# Patient Record
Sex: Female | Born: 1975 | Race: Black or African American | Hispanic: No | Smoking: Former smoker
Health system: Southern US, Community
[De-identification: ages and names within clinical notes are randomized; demographics above are authoritative.]

## PROBLEM LIST (undated history)

## (undated) ENCOUNTER — Emergency Department (HOSPITAL_COMMUNITY): Admission: EM | Payer: Medicaid Other | Source: Home / Self Care

## (undated) DIAGNOSIS — G7000 Myasthenia gravis without (acute) exacerbation: Secondary | ICD-10-CM

## (undated) DIAGNOSIS — G43909 Migraine, unspecified, not intractable, without status migrainosus: Secondary | ICD-10-CM

## (undated) DIAGNOSIS — Z9289 Personal history of other medical treatment: Secondary | ICD-10-CM

## (undated) DIAGNOSIS — F32A Depression, unspecified: Secondary | ICD-10-CM

## (undated) DIAGNOSIS — J45909 Unspecified asthma, uncomplicated: Secondary | ICD-10-CM

## (undated) DIAGNOSIS — I10 Essential (primary) hypertension: Secondary | ICD-10-CM

## (undated) DIAGNOSIS — F329 Major depressive disorder, single episode, unspecified: Secondary | ICD-10-CM

## (undated) DIAGNOSIS — Z8709 Personal history of other diseases of the respiratory system: Secondary | ICD-10-CM

## (undated) DIAGNOSIS — D649 Anemia, unspecified: Secondary | ICD-10-CM

## (undated) DIAGNOSIS — E669 Obesity, unspecified: Secondary | ICD-10-CM

## (undated) HISTORY — PX: EAR TUBE REMOVAL: SHX1486

## (undated) HISTORY — DX: Depression, unspecified: F32.A

## (undated) HISTORY — DX: Essential (primary) hypertension: I10

## (undated) HISTORY — PX: TYMPANOSTOMY TUBE PLACEMENT: SHX32

## (undated) HISTORY — DX: Obesity, unspecified: E66.9

## (undated) HISTORY — DX: Major depressive disorder, single episode, unspecified: F32.9

## (undated) HISTORY — DX: Personal history of other diseases of the respiratory system: Z87.09

## (undated) HISTORY — DX: Migraine, unspecified, not intractable, without status migrainosus: G43.909

---

## 2003-08-03 DIAGNOSIS — G7000 Myasthenia gravis without (acute) exacerbation: Secondary | ICD-10-CM

## 2003-08-03 HISTORY — DX: Myasthenia gravis without (acute) exacerbation: G70.00

## 2006-11-28 ENCOUNTER — Emergency Department (HOSPITAL_COMMUNITY): Admission: EM | Admit: 2006-11-28 | Discharge: 2006-11-29 | Payer: Self-pay | Admitting: Emergency Medicine

## 2007-01-25 ENCOUNTER — Ambulatory Visit: Payer: Self-pay | Admitting: Family Medicine

## 2007-03-16 ENCOUNTER — Emergency Department (HOSPITAL_COMMUNITY): Admission: EM | Admit: 2007-03-16 | Discharge: 2007-03-16 | Payer: Self-pay | Admitting: Emergency Medicine

## 2007-04-21 ENCOUNTER — Emergency Department (HOSPITAL_COMMUNITY): Admission: EM | Admit: 2007-04-21 | Discharge: 2007-04-21 | Payer: Self-pay | Admitting: Family Medicine

## 2007-06-19 ENCOUNTER — Other Ambulatory Visit: Admission: RE | Admit: 2007-06-19 | Discharge: 2007-06-19 | Payer: Self-pay | Admitting: Family Medicine

## 2008-01-02 ENCOUNTER — Encounter: Admission: RE | Admit: 2008-01-02 | Discharge: 2008-01-02 | Payer: Self-pay | Admitting: Neurology

## 2008-01-09 ENCOUNTER — Inpatient Hospital Stay (HOSPITAL_COMMUNITY): Admission: EM | Admit: 2008-01-09 | Discharge: 2008-01-15 | Payer: Self-pay | Admitting: Emergency Medicine

## 2008-01-16 ENCOUNTER — Encounter (HOSPITAL_COMMUNITY): Admission: RE | Admit: 2008-01-16 | Discharge: 2008-04-03 | Payer: Self-pay | Admitting: Neurology

## 2008-03-24 ENCOUNTER — Emergency Department (HOSPITAL_COMMUNITY): Admission: EM | Admit: 2008-03-24 | Discharge: 2008-03-25 | Payer: Self-pay | Admitting: Emergency Medicine

## 2008-05-27 ENCOUNTER — Inpatient Hospital Stay (HOSPITAL_COMMUNITY): Admission: EM | Admit: 2008-05-27 | Discharge: 2008-05-28 | Payer: Self-pay | Admitting: Emergency Medicine

## 2008-08-31 ENCOUNTER — Emergency Department (HOSPITAL_COMMUNITY): Admission: EM | Admit: 2008-08-31 | Discharge: 2008-08-31 | Payer: Self-pay | Admitting: Emergency Medicine

## 2008-12-11 ENCOUNTER — Emergency Department (HOSPITAL_COMMUNITY): Admission: EM | Admit: 2008-12-11 | Discharge: 2008-12-11 | Payer: Self-pay | Admitting: Emergency Medicine

## 2009-02-20 ENCOUNTER — Emergency Department (HOSPITAL_COMMUNITY): Admission: EM | Admit: 2009-02-20 | Discharge: 2009-02-20 | Payer: Self-pay | Admitting: Emergency Medicine

## 2009-10-16 ENCOUNTER — Emergency Department (HOSPITAL_COMMUNITY): Admission: EM | Admit: 2009-10-16 | Discharge: 2009-10-16 | Payer: Self-pay | Admitting: Emergency Medicine

## 2010-10-25 ENCOUNTER — Emergency Department (HOSPITAL_COMMUNITY): Payer: Medicaid Other

## 2010-10-25 ENCOUNTER — Emergency Department (HOSPITAL_COMMUNITY)
Admission: EM | Admit: 2010-10-25 | Discharge: 2010-10-25 | Disposition: A | Discharge status: Home / Self Care | Payer: Medicaid Other | Attending: Emergency Medicine | Admitting: Emergency Medicine

## 2010-10-25 DIAGNOSIS — R079 Chest pain, unspecified: Secondary | ICD-10-CM | POA: Insufficient documentation

## 2010-10-25 DIAGNOSIS — M549 Dorsalgia, unspecified: Secondary | ICD-10-CM | POA: Insufficient documentation

## 2010-10-25 DIAGNOSIS — J45909 Unspecified asthma, uncomplicated: Secondary | ICD-10-CM | POA: Insufficient documentation

## 2010-10-25 DIAGNOSIS — G7000 Myasthenia gravis without (acute) exacerbation: Secondary | ICD-10-CM | POA: Insufficient documentation

## 2010-10-25 DIAGNOSIS — M79609 Pain in unspecified limb: Secondary | ICD-10-CM | POA: Insufficient documentation

## 2010-10-25 DIAGNOSIS — I10 Essential (primary) hypertension: Secondary | ICD-10-CM | POA: Insufficient documentation

## 2010-10-25 LAB — BASIC METABOLIC PANEL
GFR calc Af Amer: >60 mL/min (ref >60)
GFR calc non Af Amer: >60 mL/min (ref >60)
Potassium: 3.4 mEq/L — ABNORMAL LOW (ref 3.5–5.1)
Sodium: 136 mEq/L (ref 135–145)

## 2010-10-25 LAB — POCT CARDIAC MARKERS
CKMB, poc: 1.5 ng/mL (ref 1.0–8.0)
Troponin i, poc: <0.05 ng/mL (ref 0.00–0.09)

## 2010-10-25 LAB — CBC
Hemoglobin: 12.9 g/dL (ref 12.0–15.0)
MCH: 29 pg (ref 26.0–34.0)
MCHC: 34.1 g/dL (ref 30.0–36.0)
RDW: 13 % (ref 11.5–15.5)

## 2010-10-25 LAB — DIFFERENTIAL
Eosinophils Relative: 3 % (ref 0–5)
Lymphs Abs: 2.2 10*3/uL (ref 0.7–4.0)
Monocytes Relative: 9 % (ref 3–12)
Neutro Abs: 3.4 10*3/uL (ref 1.7–7.7)
Neutrophils Relative %: 54 % (ref 43–77)

## 2010-10-25 LAB — D-DIMER, QUANTITATIVE: D-Dimer, Quant: <0.22 ug/mL-FEU (ref 0.00–0.48)

## 2010-11-08 LAB — URINALYSIS, ROUTINE W REFLEX MICROSCOPIC
Bilirubin Urine: NEGATIVE
Glucose, UA: NEGATIVE mg/dL
Hgb urine dipstick: NEGATIVE
Ketones, ur: NEGATIVE mg/dL
Specific Gravity, Urine: 1.035 — ABNORMAL HIGH (ref 1.005–1.030)
Urobilinogen, UA: 0.2 mg/dL (ref 0.0–1.0)
pH: 5.5 (ref 5.0–8.0)

## 2010-11-08 LAB — POCT PREGNANCY, URINE: Preg Test, Ur: NEGATIVE

## 2010-12-15 NOTE — Consult Note (Signed)
Terri Chambers, Terri Chambers NO.:  [id number]   MEDICAL RECORD NO.:  [id number]          PATIENT TYPE:  INP   LOCATION:  3315                         FACILITY:  [facility]   PHYSICIAN:  Levert Feinstein, MD          DATE OF BIRTH:  Dec 05, 1975   DATE OF CONSULTATION:  DATE OF DISCHARGE:                                 CONSULTATION   CHIEF COMPLAINT:  Worsening of myasthenia gravis symptoms.   HISTORY OF PRESENT ILLNESS:  This is a 35 year old [place] American  female, previously evaluated by Drs. Caryl Bis at [facility] on Dec 18, 2007, presenting with worsening dysarthria and  dysphagia.   Terri Chambers was diagnosed with myasthenia in 2005 at [place] 4 weeks  postpartum.  The initial symptoms was ptosis, diplopia, and also  profound bulbar weakness to the point of difficulty talking and  difficulty swallowing saliva, requires suction and also has generalized  limb muscle weakness, but she never required intubation.  She reported  her initial symptoms was controlled with high dose of steroid within 2  months' period of time, in combination with Mestinon.  Later the  prednisone was stopped.  Over the years, there is some fluctuation of  her symptoms mainly presenting with worsening ptosis, diplopia and  dysarthria, dysphagia, but overall she was able to maintain functioning  with varying dose of Mestinon, she moved to [place] about a year later  in 2006, was reported to be evaluated for potential thymectomy.  But due  to worry about the potential complication from the surgery, it was not  further proceeded.   When she was evaluated by Dr. Thad Ranger on Dec 18, 2007, she was taking  Mestinon regular doses 4 times a day, in addition Mestinon Timespan 180  twice every night. She reported if she miss her Mestinon Timespan at  nighttime, she would develop shortness of breath with prolonged sleeping  and has difficulty getting around in the morning time.   She was started on  CellCept and took her first dose last night.  Shas  run out of her Mestinon Timespan because the pharmacy she used, no  longer carry the medications.  She also missed the regular mestinon with  the misunderstanding that it should be replaced by cellcept.  This  morning when she got up she has a worsening dysarthria, dysphagia, and  but she was able to go to her college and she presented to the Urgent  Care this afternoon and was found to have low-grade fever up to 100, and  also with worsening generalized weakness and bulbar weakness, which has  prompted her ER visit.   REVIEW OF SYSTEMS:  She denies chest pain, coughing, or dysuria.   PAST MEDICAL HISTORY:  Recently diagnosed with hypermedication.   FAMILY HISTORY:  Mother has diabetes and hypertension in both parents.   SOCIAL HISTORY:  She is currently a Archivist and denies drink or  illicit drug use, quit smoking a year ago and lives with her sister and  91-year-old child.   MEDICATIONS:  Mestinon regular 60 mg up  to 5 times a day and Timespan  has run out.   DRUG ALLERGY:  No known drug allergy.   PHYSICAL EXAMINATION:  VITAL SIGNS:  Temperature 99.5, blood pressure  120/70, heart rate of 85, and respirations of 16.  GENERAL:  She is a tired looking, obese female, and with slurred nasal  speech.  CARDIAC:  Regular rate and rhythm.  PULMONARY:  With poor air movement at the base.  NECK:  Supple.  No carotid bruits.  NEUROLOGICAL:  She is alert and oriented to history taking, can carry a  conversation but developed shortness of breath with prolonged talking.  Cranial nerves II through XII.  Pupils were equal, reactive, and she has  moderate bilateral ptosis, covering one-third of the upper edge of the  bilateral pupil.  There was profound limitation of the right lateral  gaze, cover and uncover demonstrates bilateral exotropia.  She complains  of diplopia on primary gaze.  Facial sensation was normal.  She has   moderately severe eye closure, cheek process, tongue protrusion into  cheek weakness.  There was also moderate jaw opening weakness.  MOTOR EXAMINATION:  Motor strength is normal tone, bulk.  Strength is as  following: Neck flexion 4-, neck extension 5, shoulder abduction 4- and  elbow flexion 4-, extension 4-, grip 4+, hip flexion 4+ and knee flexion  4+, extension 5 and ankle dorsiflexion 5, plantar flexion 5.  Sensory  was intact.  Deep tendon reflex upper extremity 1/4.  Deep tendon lower  extremity 2/4.  Plantar responses were flexor.  Coordination normal.  Finger-to-nose, hand to shin, gait was deferred.   ASSESSMENT AND PLAN:  Myasthenia gravis likely serum- positive  generalized myasthenia gravis  presenting with exacerbation.  The plan  is as following:  1. Admit to step down unit telemetry.  2. Frequent neuro check with vital capacity qday.  3. Consult interventional radiology for Vas cath placement and she      will benefit plasmic change.  4. Hold off prednisone right now because it may cause worsening      symptoms initially.  5. Keep CellCept 500 mg b.i.d.  6. CT of the chest without contrast and to rule out the thymoma.  7. Lab evaluation to rule out UTI or pneumonia.      Levert Feinstein, MD  Electronically Signed     YY/MEDQ  D:  01/09/2008  T:  01/10/2008  Job:  [id number]

## 2010-12-15 NOTE — H&P (Signed)
NAME:  Terri Chambers, Terri Chambers NO.:  [id number]   MEDICAL RECORD NO.:  [id number]          PATIENT TYPE:  EMS   LOCATION:  MINO                         FACILITY:  [facility]   PHYSICIAN:  Ladell Pier, M.D.   DATE OF BIRTH:  1976/06/16   DATE OF ADMISSION:  05/27/2008  DATE OF DISCHARGE:                              HISTORY & PHYSICAL   CHIEF COMPLAINT:  Shortness of breath and cough.   HISTORY OF PRESENT ILLNESS:  The patient is a 35 year old African  American female, past medical history significant for myasthenia gravis.  The patient stated that for the past 2 days she has been having chills,  fever, short of breath with nausea and cough productive of yellow  sputum.  She has had no chest pain.  No sick contacts.  No abdominal  pain, no diarrhea.   PAST MEDICAL HISTORY:  Significant for myasthenia gravis.   FAMILY HISTORY:  Noncontributory.   SOCIAL HISTORY:  She does not smoke or drink alcohol.  She is a Consulting civil engineer  at [employer]  She has one daughter that is 73 years old.   MEDICATIONS:  She takes Diovan hydrochlorothiazide, unsure of the dose,  Protonix 40 mg daily, CellCept 1000 mg twice daily, and Mestinon 660 mg  4 times daily.   ALLERGIES:  No known drug allergies.   REVIEW OF SYSTEMS:  Negative otherwise stated in HPI.   PHYSICAL EXAMINATION:  VITAL SIGNS:  Temperature 100.9, blood pressure  139/83, pulse of 114, respirations 20, pulse ox 98% on room air.  GENERAL:  The patient laying on a stretcher, does not seem to be in any  acute distress.  HEENT:  Normocephalic, atraumatic.  Pupils equal, round, reactive to  light.  Throat is without erythema.  CARDIOVASCULAR:  She is tachycardiac.  LUNGS:  Decreased breath sounds throughout.  ABDOMEN:  Positive bowel sounds.  Nontender, nondistended.  EXTREMITIES: Without edema.   LABORATORY DATA:  WBC 10.2, hemoglobin 14.6, MCV 90.4, platelet 238.  UA  shows a large hemoglobin with 11-20 WBCs, sodium 133, potassium  3.3,  chloride 96, CO2 24, glucose 82, BUN 11, creatinine 0.69.   Chest x-ray shows interstitial pneumonitis versus bronchitis.   ASSESSMENT/PLAN:  1. Interstitial pneumonitis versus bronchitis.  2. Hypokalemia.  3. Hyponatremia.  4. Myasthenia gravis.  Will admit the patient to the hospital, start      her on IV Rocephin, Zithromax, Tamiflu. Put her on isolation      precautions.  Check for H1N1  blood cultures x2. Will replete her      potassium. Put her on IV fluids at 100 cc/hour normal saline bolus.      Ladell Pier, M.D.  Electronically Signed     NJ/MEDQ  D:  05/27/2008  T:  05/27/2008  Job:  [id number]

## 2010-12-15 NOTE — Discharge Summary (Signed)
[name], Terri Chambers               ACCOUNT NO.:  [id number]   MEDICAL RECORD NO.:  [id number]          PATIENT TYPE:  INP   LOCATION:  3027                         FACILITY:  [facility]   PHYSICIAN:  Casimiro Needle L. Reynolds, M.D.DATE OF BIRTH:  1976/03/21   DATE OF ADMISSION:  01/09/2008  DATE OF DISCHARGE:  01/15/2008                               DISCHARGE SUMMARY   ADMISSION DIAGNOSIS:  Myasthenia gravis with acute crisis.   DISCHARGE DIAGNOSES:  1. Myasthenia gravis with acute crisis, improving with plasmapheresis      therapy and initiation of prednisone.  2. Mild hypokalemia.   CONDITION ON DISCHARGE:  Improved.   DIET:  Regular.   ACTIVITY:  Ad lib.   MEDICATIONS AT DISCHARGE.:  1. Prednisone 20 mg 3 tablets daily (new).  2. Protonix 40 mg daily (new).  3. Mestinon 60 mg 1 tablet 4 times daily.  4. CellCept 1000 mg twice daily.   PROCEDURES:  1. Insertion of right IJ Vas-Cath on January 10, 2008.  2. Plasmapheresis January 10, 2008, January 12, 2008, and January 14, 2008.  3. CT of the chest without contrast on January 10, 2008, demonstrating      atelectasis, but no evidence of thymoma.   LABORATORY REVIEW:  CBC on admission, white count 6.9, hemoglobin 12.5,  and platelets 266,000 with normal differential.  TSH normal.  Hemoglobin  A1c 5.8.  Labs on January 14, 2008, white count 13.4, hemoglobin 11.6, and  platelets 202,000, CMET normal except for glucose of 120.  Coags normal.   HOSPITAL COURSE:  Please see the admission H and P by Dr. Levert Feinstein on  January 09, 2008, for full admission details.  Briefly, this is a 35-year-  old woman with history of myasthenia gravis who has had worsening of  symptoms over the few days prior to January 09, 2008.  Part of this was due  to the fact that she had started CellCept, and around that time, had  tried to reduce her Mestinon doses.  In addition, she was no longer able  to acquire Mestinon time span dosages, although she had been fairly  heavily  dependent.  She presented on January 09, 2008, with severe worsening  of diffuse weakness and shortness of breath.  She was admitted to the  telemetry floor because she was thought to be relatively stable  following a neuromuscular perspective.  On the morning, January 10, 2008,  she had gotten a few dose of Mestinon and was feeling better, but it  still felt to be in crisis.  She had a right IJ Vas-Cath placed as noted  above, and underwent plasmapheresis on January 10, 2008, January 12, 2008, and  January 14, 2008.  She said she felt marginally better after the  apheresis on January 10, 2008, but clearly began to feeling better after  her subsequent apheresis.  She was examined on Monday, she had only mild  facial weakness and mild weakness of intrinsic eye muscles, with no  frank diplopia, no dysphagia, and no sternal weakness of the neck or  extremities.  She was started on prednisone  on January 13, 2008, and this  did not result in any acute worsening of her symptomatology.  Because of  this, she was felt stable to go home in the morning on January 15, 2008.   DISCHARGE PLANNING:  Arrangements for outpatient plasmapheresis will be  made for January 16, 2008 and January 18, 2008.  We will then arrange for  discontinuation of the Vas-Cath on January 18, 2008.   DISCHARGE DIAGNOSIS:  She will continue on her prednisone at the present  dose for at least the next couple of weeks, as well as, her on going  treatment with Mestinon and CellCept.  She was also started on Protonix,  which she tolerated well.  She was asked to call [facility] at [phone] for followup with me in approximately 3 weeks, it  was done.  The plan will be to gradually reduce her prednisone dosage  and to maintain her on Mestinon monotherapy for her myasthenia gravis.  She was asked to call if she had any further difficulties between now  and then.      Michael L. Thad Ranger, M.D.  Electronically Signed     MLR/MEDQ  D:   01/15/2008  T:  01/15/2008  Job:  [id number]

## 2010-12-15 NOTE — Discharge Summary (Signed)
Terri Chambers, Chambers NO.:  [id number]   MEDICAL RECORD NO.:  [id number]          PATIENT TYPE:  INP   LOCATION:  5158                         FACILITY:  [facility]   PHYSICIAN:  Renee Ramus, MD       DATE OF BIRTH:  1976-05-08   DATE OF ADMISSION:  05/27/2008  DATE OF DISCHARGE:  05/28/2008                               DISCHARGE SUMMARY   PRIMARY DISCHARGE DIAGNOSIS:  Pneumonitis.   SECONDARY DIAGNOSES:  1. Myasthenia gravis.  2. Hypertension.  3. Gastroesophageal reflux disease.  4. Morbid obesity.  5. Hypokalemia/hyponatremia.   HOSPITAL COURSE:  1. Pneumonitis.  The patient is a 35 year old female who was admitted      secondary to increasing shortness of breath.  The patient was seen      in the emergency department and was admitted to our service.  The      patient did have evidence of bilateral pneumonitis on chest x-ray.      She was placed on antibiotics.  She has done well.  She will      continue on ciprofloxacin 500 mg p.o. b.i.d. x5 days postdischarge.      The patient is currently asymptomatic and wishes to be discharged.  2. Hypertension.  The patient did have mild hyponatremia and      hypokalemia, likely secondary to her Diovan/hydrochlorothiazide.      We will hold this at discharge and have her follow up with her      primary care physician to discuss whether she should be on thiazide      diuretic.  The patient's blood pressure has been stable.  3. Morbid obesity.  This is stable.  4. Myasthenia gravis.  This is too stable.  The patient will continue      on her outpatient medication regimen.   LABS OF NOTE:  1. Initial white blood cell count of 10.2, decreasing to 5.3.  2. Mild hyponatremia, sodium of 133, increasing to 135 at discharge.  3. Hypokalemia, 3.3 increasing to 3.4 on the day of discharge.  4. Mild hypoalbuminemia with an albumin of 3.1.  5. UA showing hematuria with 1120 RBCs per high-powered field, but no      evidence of  infection.   STUDIES:  Chest x-ray showing interstitial pneumonitis.   DISCHARGE MEDICATIONS:  1. Ciprofloxacin 500 mg 1 p.o. b.i.d. x5 days.  2. Protonix 40 mg p.o. daily.  3. CellCept 1 g p.o. b.i.d.  4. Diovan/hydrochlorothiazide which we are discontinuing.  5. Mestinon 60 mg p.o. q.i.d.   There were no labs or studies pending at the time of discharge.  The  patient is in stable condition and anxious for discharge.   TIME SPENT:  35 minutes.      Renee Ramus, MD  Electronically Signed     JF/MEDQ  D:  05/28/2008  T:  05/29/2008  Job:  [id number]

## 2011-04-29 LAB — PROTIME-INR
INR: 1
INR: 1.1
INR: 1.1
INR: 1.2
INR: 1.3
INR: 1.4
Prothrombin Time: 13.6
Prothrombin Time: 14.2
Prothrombin Time: 14.3
Prothrombin Time: 15.2
Prothrombin Time: 17.6 — ABNORMAL HIGH

## 2011-04-29 LAB — FIBRINOGEN: Fibrinogen: 461

## 2011-04-29 LAB — APTT
aPTT: 34
aPTT: 36

## 2011-04-29 LAB — BASIC METABOLIC PANEL WITH GFR
BUN: 5 — ABNORMAL LOW
BUN: 7
BUN: 7
BUN: 9
CO2: 23
CO2: 26
CO2: 27
CO2: 25
Calcium: 7.9 — ABNORMAL LOW
Calcium: 8.2 — ABNORMAL LOW
Calcium: 8.6
Calcium: 8 — ABNORMAL LOW
Chloride: 105
Chloride: 109
Chloride: 112
Chloride: 115 — ABNORMAL HIGH
Creatinine, Ser: 0.61
Creatinine, Ser: 0.62
Creatinine, Ser: 0.67
Creatinine, Ser: 0.68
GFR calc non Af Amer: >60
GFR calc non Af Amer: >60
GFR calc non Af Amer: >60
GFR calc non Af Amer: >60
Glucose, Bld: 100 — ABNORMAL HIGH
Glucose, Bld: 103 — ABNORMAL HIGH
Glucose, Bld: 106 — ABNORMAL HIGH
Glucose, Bld: 91
Potassium: 3.2 — ABNORMAL LOW
Potassium: 3.4 — ABNORMAL LOW
Potassium: 3.6
Potassium: 3.7
Sodium: 140
Sodium: 140
Sodium: 141
Sodium: 144

## 2011-04-29 LAB — RETICULOCYTES
RBC.: 3.97
Retic Count, Absolute: 35.7
Retic Ct Pct: 0.9

## 2011-04-29 LAB — CBC
HCT: 34 — ABNORMAL LOW
HCT: 34.2 — ABNORMAL LOW
HCT: 35.8 — ABNORMAL LOW
HCT: 36.2
HCT: 38.6
Hemoglobin: 11.6 — ABNORMAL LOW
Hemoglobin: 11.6 — ABNORMAL LOW
Hemoglobin: 12.4
Hemoglobin: 12.5
Hemoglobin: 13
MCHC: 33.6
MCHC: 33.9
MCHC: 34
MCHC: 34.6
MCV: 88.9
MCV: 89.6
MCV: 90
MCV: 90.1
Platelets: 202
Platelets: 203
Platelets: 211
Platelets: 246
RBC: 3.81 — ABNORMAL LOW
RBC: 3.83 — ABNORMAL LOW
RBC: 3.98
RBC: 4.01
RBC: 4.29
RDW: 13.1
RDW: 13.3
RDW: 13.4
RDW: 13.5
RDW: 13.6
WBC: 13.4 — ABNORMAL HIGH
WBC: 6.1
WBC: 6.5
WBC: 6.9
WBC: 7
WBC: 7.9

## 2011-04-29 LAB — COMPREHENSIVE METABOLIC PANEL
ALT: 12
Albumin: 3.8
Alkaline Phosphatase: 21 — ABNORMAL LOW
GFR calc Af Amer: >60
Potassium: 3.5
Sodium: 140
Total Protein: 5 — ABNORMAL LOW

## 2011-04-29 LAB — DIFFERENTIAL
Basophils Relative: 0
Eosinophils Absolute: 0.1
Eosinophils Relative: 1
Lymphs Abs: 2.2
Monocytes Relative: 8
Neutrophils Relative %: 59

## 2011-04-29 LAB — HEPATITIS C ANTIBODY: HCV Ab: NEGATIVE

## 2011-04-29 LAB — TSH: TSH: 1.849

## 2011-04-29 LAB — MYASTHENIA GRAVIS PANEL 2: Acetylchol Block Ab: 71 % — ABNORMAL HIGH (ref 0–15)

## 2011-04-29 LAB — HEMOGLOBIN A1C: Mean Plasma Glucose: 129

## 2011-04-29 LAB — HEPATITIS B SURFACE ANTIGEN: Hepatitis B Surface Ag: NEGATIVE

## 2011-04-29 LAB — TYPE AND SCREEN

## 2011-05-03 LAB — CULTURE, BLOOD (ROUTINE X 2): Culture: NO GROWTH

## 2011-05-03 LAB — COMPREHENSIVE METABOLIC PANEL
ALT: 23
AST: 24
Albumin: 3.1 — ABNORMAL LOW
Alkaline Phosphatase: 41
Chloride: 102
GFR calc Af Amer: >60
Potassium: 3.4 — ABNORMAL LOW
Sodium: 135
Total Bilirubin: 0.4
Total Protein: 5.9 — ABNORMAL LOW

## 2011-05-03 LAB — BASIC METABOLIC PANEL
Calcium: 9
GFR calc Af Amer: >60
GFR calc non Af Amer: >60
Potassium: 3.3 — ABNORMAL LOW
Sodium: 133 — ABNORMAL LOW

## 2011-05-03 LAB — URINALYSIS, ROUTINE W REFLEX MICROSCOPIC
Bilirubin Urine: NEGATIVE
Glucose, UA: NEGATIVE
Ketones, ur: NEGATIVE
Leukocytes, UA: NEGATIVE
Specific Gravity, Urine: 1.027
pH: 5

## 2011-05-03 LAB — URINE MICROSCOPIC-ADD ON

## 2011-05-03 LAB — DIFFERENTIAL
Eosinophils Relative: 2
Lymphocytes Relative: 10 — ABNORMAL LOW
Lymphs Abs: 1
Monocytes Absolute: 0.7
Neutro Abs: 8.3 — ABNORMAL HIGH

## 2011-05-03 LAB — CBC
HCT: 39.3
HCT: 42
Hemoglobin: 14.6
Platelets: 221
RDW: 13.1
WBC: 10.2
WBC: 5.3

## 2011-09-06 ENCOUNTER — Emergency Department (HOSPITAL_COMMUNITY)
Admission: EM | Admit: 2011-09-06 | Discharge: 2011-09-06 | Disposition: A | Discharge status: Home / Self Care | Payer: Medicaid Other | Attending: Emergency Medicine | Admitting: Emergency Medicine

## 2011-09-06 ENCOUNTER — Encounter (HOSPITAL_COMMUNITY): Payer: Self-pay | Admitting: *Deleted

## 2011-09-06 DIAGNOSIS — H5710 Ocular pain, unspecified eye: Secondary | ICD-10-CM | POA: Insufficient documentation

## 2011-09-06 DIAGNOSIS — G7000 Myasthenia gravis without (acute) exacerbation: Secondary | ICD-10-CM | POA: Insufficient documentation

## 2011-09-06 MED ORDER — PYRIDOSTIGMINE BROMIDE 60 MG PO TABS
60.0000 mg | ORAL_TABLET | Freq: Once | ORAL | Status: AC
  Administered 2011-09-06: 60 mg via ORAL
  Filled 2011-09-06: qty 1

## 2011-09-06 MED ORDER — HYDROCODONE-ACETAMINOPHEN 5-325 MG PO TABS
1.0000 | ORAL_TABLET | Freq: Once | ORAL | Status: AC
  Administered 2011-09-06: 1 via ORAL

## 2011-09-06 MED ORDER — PYRIDOSTIGMINE BROMIDE 60 MG PO TABS: 60.0000 mg | ORAL_TABLET | Freq: Three times a day (TID) | ORAL | Status: DC

## 2011-09-06 MED ORDER — HYDROCODONE-ACETAMINOPHEN 5-325 MG PO TABS
ORAL_TABLET | ORAL | Status: AC
  Filled 2011-09-06: qty 1

## 2011-09-06 MED ORDER — MYCOPHENOLATE MOFETIL 250 MG PO CAPS
500.0000 mg | ORAL_CAPSULE | Freq: Once | ORAL | Status: AC
  Administered 2011-09-06: 500 mg via ORAL
  Filled 2011-09-06: qty 2

## 2011-09-06 MED ORDER — MYCOPHENOLATE MOFETIL 500 MG PO TABS: 500.0000 mg | ORAL_TABLET | Freq: Two times a day (BID) | ORAL | Status: DC

## 2011-09-06 NOTE — ED Notes (Addendum)
Per MD would like Care Management involved to help pt be able to afford medications. Selena Batten, CM made aware and will see pt shortly,

## 2011-09-06 NOTE — Progress Notes (Signed)
Pt plans to see her neurologist for assistance with needed medications as soon as possible, will contact DSS worker for updated on Medicaid status and will complete pt assistance applications provided by CM.  Pt stating she does not need Rx for Celexa and will get Rx for other 2 medications from neurologist office if needed.  Encouraged pt to follow through with action plan.  Provided with CM contact information for further questions.  Appreciative of care.  Pt's mother provided with information also.  Pending medications processing from [facility] pharmacy Cm requested pharmacist to contact ED RN when ready

## 2011-09-06 NOTE — Progress Notes (Signed)
ED CM contacted by Rolly Salter, ED RN about medication assistance for pt. Spoke with Toni Amend in [place] pharmacy to find that pt is eligible for [facility] indigent medication assistance Pending Rx from EDP

## 2011-09-06 NOTE — ED Notes (Signed)
Pt reports hx of myasthenia gravis. Reports eye pressure x 4 days. States has been off of medication about 3 weeks. Unable to afford medications. States eyes going crossed. Upper extremities also feel weak. Also reports pressure to back of head.

## 2011-09-06 NOTE — ED Provider Notes (Cosign Needed)
History     CSN: [id number]  Arrival date & time 09/06/11  1313   First MD Initiated Contact with Patient 09/06/11 1613      Chief Complaint  Patient presents with  . Pressure Behind the Eyes    (Consider location/radiation/quality/duration/timing/severity/associated sxs/prior treatment) Patient is a 36 y.o. female presenting with eye pain. The history is provided by the patient (the pt has not had her medicine in one month).  Eye Pain This is a new problem. The current episode started more than 2 days ago. The problem occurs constantly. The problem has not changed since onset.Pertinent negatives include no chest pain, no abdominal pain and no headaches. The symptoms are aggravated by nothing. The symptoms are relieved by nothing. The treatment provided mild relief.    Past Medical History  Diagnosis Date  . Myasthenia gravis     History reviewed. No pertinent past surgical history.  No family history on file.  History  Substance Use Topics  . Smoking status: Never Smoker   . Smokeless tobacco: Not on file  . Alcohol Use: No    OB History    Grav Para Term Preterm Abortions TAB SAB Ect Mult Living                  Review of Systems  Constitutional: Negative for fatigue.  HENT: Negative for congestion, sinus pressure and ear discharge.        Pt states sometime her eyes get crossed  Eyes: Positive for pain. Negative for discharge.  Respiratory: Negative for cough.   Cardiovascular: Negative for chest pain.  Gastrointestinal: Negative for abdominal pain and diarrhea.  Genitourinary: Negative for frequency and hematuria.  Musculoskeletal: Negative for back pain.  Skin: Negative for rash.  Neurological: Negative for seizures and headaches.  Hematological: Negative.   Psychiatric/Behavioral: Negative for hallucinations.    Allergies  Review of patient's allergies indicates no known allergies.  Home Medications   Current Outpatient Rx  Name Route Sig Dispense  Refill  . BC HEADACHE POWDER PO Oral Take 1 packet by mouth daily.    Marland Kitchen CITALOPRAM HYDROBROMIDE 10 MG PO TABS Oral Take 10-20 mg by mouth daily. Take 10 mg daily for 2 weeks, the increase to 20 mg daily    . GUAIFENESIN 100 MG/5ML PO LIQD Oral Take 200 mg by mouth 3 (three) times daily as needed. For cold    . LISINOPRIL 5 MG PO TABS Oral Take 5 mg by mouth daily.    Marland Kitchen MYCOPHENOLATE MOFETIL 500 MG PO TABS Oral Take 1,000 mg by mouth 2 (two) times daily.    Marland Kitchen PYRIDOSTIGMINE BROMIDE 60 MG PO TABS Oral Take 60 mg by mouth 2 (two) times daily.     . TETRAHYDROZOLINE HCL 0.05 % OP SOLN Both Eyes Place 2 drops into both eyes 2 (two) times daily.    Marland Kitchen MYCOPHENOLATE MOFETIL 500 MG PO TABS Oral Take 1 tablet (500 mg total) by mouth 2 (two) times daily. 60 tablet 1  . PYRIDOSTIGMINE BROMIDE 60 MG PO TABS Oral Take 1 tablet (60 mg total) by mouth 3 (three) times daily. 60 tablet 0    BP 153/100  Pulse 64  Temp(Src) 98.9 F (37.2 C) (Oral)  Resp 16  SpO2 100%  Physical Exam  Constitutional: She is oriented to person, place, and time. She appears well-developed.  HENT:  Head: Normocephalic and atraumatic.  Eyes: Conjunctivae and EOM are normal. Pupils are equal, round, and reactive to light. No scleral  icterus.  Neck: Neck supple. No thyromegaly present.  Cardiovascular: Normal rate and regular rhythm.  Exam reveals no gallop and no friction rub.   No murmur heard. Pulmonary/Chest: No stridor. She has no wheezes. She has no rales. She exhibits no tenderness.  Abdominal: She exhibits no distension. There is no tenderness. There is no rebound.  Musculoskeletal: Normal range of motion. She exhibits no edema.  Lymphadenopathy:    She has no cervical adenopathy.  Neurological: She is oriented to person, place, and time. Coordination normal.  Skin: No rash noted. No erythema.  Psychiatric: She has a normal mood and affect. Her behavior is normal.    ED Course  Procedures (including critical care  time)  Labs Reviewed - No data to display No results found.   1. Myasthenia gravis       MDM  I spoke to the patient's neurologist. And we are going to give her her medicines today and put her back on her medicines for 3 days. She will followup with gastroenterology this week and they will help arrange her to get her medicines from the manufacturer        Benny Lennert, MD 09/06/11 [phone]

## 2011-09-06 NOTE — Progress Notes (Signed)
ED CM reviewed [facility] indigent policy with EDP, [place].  EDP stating pt on medications that "must be taken"  CM spoke with pt and female family member in ED rm #13.  Pt on cellcept (mycophenolate), mestinon (pyridostigmine) and Citalopram (Celexa). Pt reporting her mail arriving in a different location during 07/2011 therefore she did not get her medicaid forms completed and to her DSS worker until the beginning of 2013.  Pt states last cellcept taken on last night but she had been taking 2 once a day when she should have been taking 2 bid. Celexa and mestinon  have not been taken in last 3-4 weeks. Pt is in college.  Pt seen last by Dr Vickey Huger in September 2012. Previously has been seen by Dr Thad Ranger but he is no longer at [facility]. CM discussed [email or web address] site, provided applications for each medication and a $4 medication list for [facility] CM spoke with Velda Shell pharmacist Pt is only able to receive 3 day supply of cellcept and mestinon until she arrives at Dr Vickey Huger office soon.  Celexa can be obtained at [facility] ($4) EDP, RN and pt updated

## 2012-02-18 ENCOUNTER — Encounter (HOSPITAL_COMMUNITY): Payer: Self-pay | Admitting: *Deleted

## 2012-02-18 ENCOUNTER — Inpatient Hospital Stay (HOSPITAL_COMMUNITY): Payer: Medicaid Other

## 2012-02-18 ENCOUNTER — Inpatient Hospital Stay (HOSPITAL_COMMUNITY)
Admission: AD | Admit: 2012-02-18 | Discharge: 2012-02-18 | Disposition: A | Discharge status: Home / Self Care | Payer: Medicaid Other | Source: Ambulatory Visit | Attending: Obstetrics & Gynecology | Admitting: Obstetrics & Gynecology

## 2012-02-18 DIAGNOSIS — O00109 Unspecified tubal pregnancy without intrauterine pregnancy: Secondary | ICD-10-CM | POA: Insufficient documentation

## 2012-02-18 DIAGNOSIS — O200 Threatened abortion: Secondary | ICD-10-CM | POA: Insufficient documentation

## 2012-02-18 DIAGNOSIS — N83299 Other ovarian cyst, unspecified side: Secondary | ICD-10-CM

## 2012-02-18 DIAGNOSIS — Z3490 Encounter for supervision of normal pregnancy, unspecified, unspecified trimester: Secondary | ICD-10-CM

## 2012-02-18 LAB — URINALYSIS, ROUTINE W REFLEX MICROSCOPIC
Glucose, UA: NEGATIVE mg/dL
Ketones, ur: NEGATIVE mg/dL
Protein, ur: NEGATIVE mg/dL
pH: 5.5 (ref 5.0–8.0)

## 2012-02-18 LAB — WET PREP, GENITAL

## 2012-02-18 LAB — URINE MICROSCOPIC-ADD ON

## 2012-02-18 NOTE — MAU Note (Signed)
Patient states she has had a positive pregnancy test on 7-16. Had a light bleeding on tissue after urinating, not wearing a pad. Has been having back pain since this am and upper left abdominal pain.

## 2012-02-18 NOTE — MAU Note (Signed)
Pt had a positive UPT 3 days ago;

## 2012-02-18 NOTE — MAU Provider Note (Signed)
History     CSN: [id number]  Arrival date and time: 02/18/12 1622   First Provider Initiated Contact with Patient 02/18/12 1707      Chief Complaint  Patient presents with  . Vaginal Bleeding   HPI This is a 36 y.o. female at Unknown [place] who presents with c/o bleeding/small amount since yesterday. Thinks she might be pregnant, had + UPT at home. Has low back pain.  Denies other symptoms. History is remarkable for Myasthenia Gravis.   OB History    Grav Para Term Preterm Abortions TAB SAB Ect Mult Living   1 1 1       1       Past Medical History  Diagnosis Date  . Myasthenia gravis     Past Surgical History  Procedure Date  . Ear tube removal     Family History  Problem Relation Age of Onset  . Other Neg Hx     History  Substance Use Topics  . Smoking status: Never Smoker   . Smokeless tobacco: Not on file  . Alcohol Use: No    Allergies: No Known Allergies  Prescriptions prior to admission  Medication Sig Dispense Refill  . Aspirin-Salicylamide-Caffeine (BC HEADACHE POWDER PO) Take 1 packet by mouth daily.      . citalopram (CELEXA) 10 MG tablet Take 10-20 mg by mouth daily. Take 10 mg daily for 2 weeks, the increase to 20 mg daily      . lisinopril (PRINIVIL,ZESTRIL) 5 MG tablet Take 5 mg by mouth daily.      . mycophenolate (CELLCEPT) 500 MG tablet Take 1,000 mg by mouth 2 (two) times daily.      . mycophenolate (CELLCEPT) 500 MG tablet Take 1 tablet (500 mg total) by mouth 2 (two) times daily.  60 tablet  1  . pyridostigmine (MESTINON) 60 MG tablet Take 60 mg by mouth 2 (two) times daily.       Marland Kitchen pyridostigmine (MESTINON) 60 MG tablet Take 1 tablet (60 mg total) by mouth 3 (three) times daily.  60 tablet  0  . tetrahydrozoline 0.05 % ophthalmic solution Place 2 drops into both eyes 2 (two) times daily.        ROS As listed in HPI  Physical Exam   Blood pressure 133/71, pulse 75, temperature 98.9 F (37.2 C), temperature source Oral, resp. rate 16,  height 5\' 7"  (1.702 m), weight 275 lb 3.2 oz (124.83 kg), last menstrual period 01/12/2012, SpO2 100.00%.  Physical Exam  Constitutional: She is oriented to person, place, and time. She appears well-developed and well-nourished. No distress.  Cardiovascular: Normal rate.   Respiratory: Effort normal.  GI: Soft. She exhibits no distension and no mass. There is no tenderness. There is no rebound and no guarding.  Genitourinary: Uterus normal. Vaginal discharge (pink tinged) found.       Uterus small. Adnexae tender only on the right  Musculoskeletal: Normal range of motion.  Neurological: She is alert and oriented to person, place, and time.  Skin: Skin is warm and dry.  Psychiatric: She has a normal mood and affect.   Blood Type o+   MAU Course  Procedures  MDM Reviewed results with patient. Plan to follow  quants and repeat US when appropriate.  Assessment and Plan  A:  Pregnancy       Right adnexal mass      Threatened AB vs ectopic P:  Discharge home.      Consulted Dr Emelda Fear  Would repeat US for cyst in 4 weeks      Will repeat quant every 48 hrs and Rescan when appropriate        [facility] 02/18/2012, 5:19 PM

## 2012-02-19 LAB — GC/CHLAMYDIA PROBE AMP, GENITAL: Chlamydia, DNA Probe: NEGATIVE

## 2012-02-20 ENCOUNTER — Inpatient Hospital Stay (HOSPITAL_COMMUNITY)
Admission: AD | Admit: 2012-02-20 | Discharge: 2012-02-20 | Disposition: A | Discharge status: Home / Self Care | Payer: Medicaid Other | Source: Ambulatory Visit | Attending: Obstetrics and Gynecology | Admitting: Obstetrics and Gynecology

## 2012-02-20 DIAGNOSIS — O209 Hemorrhage in early pregnancy, unspecified: Secondary | ICD-10-CM | POA: Insufficient documentation

## 2012-02-20 DIAGNOSIS — Z09 Encounter for follow-up examination after completed treatment for conditions other than malignant neoplasm: Secondary | ICD-10-CM

## 2012-02-20 NOTE — MAU Provider Note (Signed)
Terri Chambers is a 36 y.o. female who presents to MAU for follow up. She was here 2 days ago and her Bhcg was 9. She returns today and it has dropped.  Results for orders placed during the hospital encounter of 02/20/12 (from the past 24 hour(s))  HCG, QUANTITATIVE, PREGNANCY     Status: Normal   Collection Time   02/20/12  2:43 PM      Component Value Range   hCG, Beta Chain, Quant, S 3  <5 mIU/mL     BP 157/93  Pulse 77  Temp 98.8 F (37.1 C)  Resp 18  LMP 01/12/2012    She is to follow up in GYN Clinic in 4 weeks. She will need a follow up ultrasound for the complex mass in the right ovary.  I have reviewed this patient's vital signs, nurses notes, appropriate labs and imaging. I have discussed results and plan of care in detail with the patient and she voices understanding. I have sent a message to the GYN Clinic to call the patient for follow up. I have discussed with the patient her elevated blood pressure today and need for follow up.

## 2012-02-20 NOTE — MAU Note (Signed)
Pt reports no vaginal bleeding or pain. Here for repeat [facility].

## 2012-02-20 NOTE — MAU Provider Note (Signed)
Attestation of Attending Supervision of Advanced Practitioner: Evaluation and management procedures were performed by the PA/NP/CNM/OB Fellow under my supervision/collaboration. Chart reviewed and agree with management and plan.  Earley Grobe V 02/20/2012 1:06 PM

## 2012-02-21 NOTE — MAU Provider Note (Signed)
Attestation of Attending Supervision of Advanced Practitioner: Evaluation and management procedures were performed by the PA/NP/CNM/OB Fellow under my supervision/collaboration. Chart reviewed and agree with management and plan.  Tilda Burrow 02/21/2012 2:41 AM   In spoke with the patient who will call for followup

## 2012-03-22 ENCOUNTER — Encounter: Payer: Medicaid Other | Admitting: Obstetrics & Gynecology

## 2012-10-02 DIAGNOSIS — G7001 Myasthenia gravis with (acute) exacerbation: Secondary | ICD-10-CM | POA: Insufficient documentation

## 2012-10-02 DIAGNOSIS — Z5181 Encounter for therapeutic drug level monitoring: Secondary | ICD-10-CM | POA: Insufficient documentation

## 2012-11-09 ENCOUNTER — Other Ambulatory Visit: Payer: Self-pay | Admitting: Neurology

## 2013-01-25 ENCOUNTER — Other Ambulatory Visit: Payer: Self-pay | Admitting: Neurology

## 2013-02-14 ENCOUNTER — Encounter: Payer: Self-pay | Admitting: Neurology

## 2013-02-14 DIAGNOSIS — Z5181 Encounter for therapeutic drug level monitoring: Secondary | ICD-10-CM

## 2013-02-14 DIAGNOSIS — G7000 Myasthenia gravis without (acute) exacerbation: Secondary | ICD-10-CM

## 2013-02-14 DIAGNOSIS — G7001 Myasthenia gravis with (acute) exacerbation: Secondary | ICD-10-CM

## 2013-02-19 ENCOUNTER — Ambulatory Visit: Payer: Self-pay | Admitting: Neurology

## 2013-08-22 ENCOUNTER — Telehealth: Payer: Self-pay | Admitting: Neurology

## 2013-08-22 NOTE — Telephone Encounter (Signed)
Patient walked into lobby requesting to make a follow up appointment with Dr. Jannifer Franklin. She no-showed last July 2014. She has myasthenia gravis and would like to follow up.

## 2013-08-22 NOTE — Telephone Encounter (Signed)
Needs yearly revisit

## 2013-08-23 ENCOUNTER — Telehealth: Payer: Self-pay | Admitting: Neurology

## 2013-08-23 ENCOUNTER — Encounter (INDEPENDENT_AMBULATORY_CARE_PROVIDER_SITE_OTHER): Payer: Self-pay

## 2013-08-23 ENCOUNTER — Ambulatory Visit: Payer: Self-pay | Admitting: Neurology

## 2013-08-23 NOTE — Telephone Encounter (Signed)
The patient no showed for the revisit appointment today. The patient also did not show for a revisit appointment in July 2014. The patient was last seen in March of 2014.

## 2013-09-07 ENCOUNTER — Encounter (HOSPITAL_COMMUNITY): Payer: Self-pay | Admitting: Emergency Medicine

## 2013-09-07 ENCOUNTER — Emergency Department (INDEPENDENT_AMBULATORY_CARE_PROVIDER_SITE_OTHER): Payer: Medicaid Other

## 2013-09-07 ENCOUNTER — Emergency Department (INDEPENDENT_AMBULATORY_CARE_PROVIDER_SITE_OTHER)
Admission: EM | Admit: 2013-09-07 | Discharge: 2013-09-07 | Disposition: A | Discharge status: Home / Self Care | Payer: Medicaid Other | Source: Home / Self Care | Attending: Family Medicine | Admitting: Family Medicine

## 2013-09-07 DIAGNOSIS — J40 Bronchitis, not specified as acute or chronic: Secondary | ICD-10-CM

## 2013-09-07 MED ORDER — ALBUTEROL SULFATE HFA 108 (90 BASE) MCG/ACT IN AERS: 1.0000 | INHALATION_SPRAY | Freq: Four times a day (QID) | RESPIRATORY_TRACT | Status: DC | PRN

## 2013-09-07 MED ORDER — AZITHROMYCIN 250 MG PO TABS: 250.0000 mg | ORAL_TABLET | Freq: Every day | ORAL | Status: DC

## 2013-09-07 NOTE — Discharge Instructions (Signed)

## 2013-09-07 NOTE — ED Provider Notes (Signed)
CSN: [id number]     Arrival date & time 09/07/13  0810 History   First MD Initiated Contact with Patient 09/07/13 [phone]     Chief Complaint  Patient presents with  . Cough   (Consider location/radiation/quality/duration/timing/severity/associated sxs/prior Treatment) Patient is a 38 y.o. female presenting with cough. The history is provided by the patient. No language interpreter was used.  Cough Cough characteristics:  Productive Sputum characteristics:  Green Severity:  Moderate Onset quality:  Gradual Duration:  2 weeks Timing:  Constant Progression:  Worsening Chronicity:  New Relieved by:  Nothing Worsened by:  Nothing tried Ineffective treatments:  None tried Associated symptoms: chest pain and fever   Pt reports she has been coughing for the past 2 week.    Past Medical History  Diagnosis Date  . Myasthenia gravis   . Obesity   . Migraine headache   . Depression   . Hypertension   . History of asthma    Past Surgical History  Procedure Laterality Date  . Ear tube removal     Family History  Problem Relation Age of Onset  . Other Neg Hx   . Diabetes Mother   . Arthritis Father   . Hypertension Father   . Cerebral aneurysm Maternal Grandmother   . Congestive Heart Failure Paternal Grandmother   . Cervical cancer Other     Maternal great-grandmother died of cervical cancer   History  Substance Use Topics  . Smoking status: Former Smoker    Quit date: 08/02/2008  . Smokeless tobacco: Not on file  . Alcohol Use: No   OB History   Grav Para Term Preterm Abortions TAB SAB Ect Mult Living   1 1 1       1      Review of Systems  Constitutional: Positive for fever.  Respiratory: Positive for cough.   Cardiovascular: Positive for chest pain.  All other systems reviewed and are negative.    Allergies  Review of patient's allergies indicates no known allergies.  Home Medications   Current Outpatient Rx  Name  Route  Sig  Dispense  Refill  . ADVAIR  DISKUS 250-50 MCG/DOSE AEPB      INHALE TWO PUFFS BY MOUTH DAILY IF NEEDED   1 each   11   . amLODipine (NORVASC) 5 MG tablet      TAKE 1 TABLET BY MOUTH ONCE DAILY   30 tablet   4   . folic acid (FOLVITE) 1 MG tablet   Oral   Take 1 mg by mouth daily.         Marland Kitchen pyridostigmine (MESTINON) 60 MG tablet   Oral   Take 60 mg by mouth every 4 (four) hours as needed. Pt takes medication every 4-6 hours         . SUMAtriptan (IMITREX) 100 MG tablet      ONE TABLET TWICE A DAY IF NEEDED   9 tablet   11    BP 151/104  Pulse 75  Temp(Src) 97.5 F (36.4 C) (Oral)  Resp 16  SpO2 97%  LMP 08/29/2013 Physical Exam  Nursing note and vitals reviewed. Constitutional: She is oriented to person, place, and time. She appears well-developed and well-nourished.  HENT:  Head: Normocephalic.  Right Ear: External ear normal.  Left Ear: External ear normal.  Nose: Nose normal.  Mouth/Throat: Oropharynx is clear and moist.  Eyes: Conjunctivae and EOM are normal. Pupils are equal, round, and reactive to light.  Neck: Normal range  of motion. Neck supple.  Cardiovascular: Normal rate and normal heart sounds.   Pulmonary/Chest: Effort normal and breath sounds normal.  Abdominal: Soft. Bowel sounds are normal.  Musculoskeletal: Normal range of motion.  Neurological: She is alert and oriented to person, place, and time. She has normal reflexes.  Skin: Skin is warm.  Psychiatric: She has a normal mood and affect.    ED Course  Procedures (including critical care time) Labs Review Labs Reviewed - No data to display Imaging Review Dg Chest 2 View  09/07/2013   CLINICAL DATA:  Cough for 2.5 weeks  EXAM: CHEST  2 VIEW  COMPARISON:  10/25/2010  FINDINGS: Enlargement of cardiac silhouette.  Mediastinal contours and pulmonary vascularity normal.  Focal eventration right diaphragm anteriorly.  No pulmonary infiltrate, pleural effusion or pneumothorax.  Thoracolumbar scoliosis.  IMPRESSION:  Enlargement of cardiac silhouette.  No acute abnormalities.   Electronically Signed   By: Lavonia Dana M.D.   On: 09/07/2013 09:06      MDM   1. Bronchitis     Pt given rx for zithromax and albuterol,   Pt advised return if any problems.    Fransico Meadow, PA-C 09/07/13 [place], PA-C 09/07/13 [phone]

## 2013-09-07 NOTE — ED Notes (Signed)
Pt  Has  Symptoms  Of  Cough   / congested       Chest  And  Back  Pain       With  Chills   Sweats         Nausea  Vomiting  /  Diarrhea

## 2013-09-12 NOTE — ED Provider Notes (Signed)
Medical screening examination/treatment/procedure(s) were performed by resident physician or non-physician practitioner and as supervising physician I was immediately available for consultation/collaboration.   Pauline Good MD.   Billy Fischer, MD 09/12/13 [phone]

## 2013-09-21 ENCOUNTER — Emergency Department (HOSPITAL_COMMUNITY)
Admission: EM | Admit: 2013-09-21 | Discharge: 2013-09-21 | Discharge status: Against Medical Advice | Payer: Medicaid Other | Attending: Emergency Medicine | Admitting: Emergency Medicine

## 2013-09-21 ENCOUNTER — Encounter (HOSPITAL_COMMUNITY): Payer: Self-pay | Admitting: Emergency Medicine

## 2013-09-21 DIAGNOSIS — I10 Essential (primary) hypertension: Secondary | ICD-10-CM | POA: Insufficient documentation

## 2013-09-21 DIAGNOSIS — Z87891 Personal history of nicotine dependence: Secondary | ICD-10-CM | POA: Insufficient documentation

## 2013-09-21 DIAGNOSIS — M549 Dorsalgia, unspecified: Secondary | ICD-10-CM | POA: Insufficient documentation

## 2013-09-21 DIAGNOSIS — R0602 Shortness of breath: Secondary | ICD-10-CM | POA: Insufficient documentation

## 2013-09-21 DIAGNOSIS — E669 Obesity, unspecified: Secondary | ICD-10-CM | POA: Insufficient documentation

## 2013-09-21 DIAGNOSIS — J45909 Unspecified asthma, uncomplicated: Secondary | ICD-10-CM | POA: Insufficient documentation

## 2013-09-21 NOTE — ED Notes (Signed)
Pt still in Peds with daughter.  Will let x-ray know when pt returns.

## 2013-09-21 NOTE — ED Notes (Signed)
Pt st's she is not going to be seen tonight st's she will come back tomorrow.

## 2013-09-21 NOTE — ED Notes (Addendum)
Pt in c/o shortness of breath and mid back pain, between her shoulder blades that started today when she walked outside, pt speaking in full sentences, was seen on 2/6 for URI but states most of those symptoms have improved. Pt noted to be coughing in triage, states pain is worse with movement or taking a deep breath.

## 2013-09-21 NOTE — ED Notes (Signed)
Pt remains in peds with her daughter.

## 2013-09-21 NOTE — ED Notes (Signed)
Pt still in peds.

## 2014-03-29 NOTE — Telephone Encounter (Signed)
Noted  

## 2014-05-10 ENCOUNTER — Emergency Department: Payer: Self-pay | Admitting: Emergency Medicine

## 2014-05-10 LAB — CBC WITH DIFFERENTIAL/PLATELET
Basophil #: 0 10*3/uL (ref 0.0–0.1)
Basophil %: 0.5 %
EOS PCT: 2.5 %
Eosinophil #: 0.2 10*3/uL (ref 0.0–0.7)
HCT: 38.9 % (ref 35.0–47.0)
HGB: 12.3 g/dL (ref 12.0–16.0)
Lymphocyte #: 1.9 10*3/uL (ref 1.0–3.6)
Lymphocyte %: 31.2 %
MCH: 28.7 pg (ref 26.0–34.0)
MCHC: 31.7 g/dL — AB (ref 32.0–36.0)
MCV: 91 fL (ref 80–100)
Monocyte #: 0.5 x10 3/mm (ref 0.2–0.9)
Monocyte %: 7.5 %
NEUTROS ABS: 3.6 10*3/uL (ref 1.4–6.5)
Neutrophil %: 58.3 %
Platelet: 281 10*3/uL (ref 150–440)
RBC: 4.3 10*6/uL (ref 3.80–5.20)
RDW: 13.4 % (ref 11.5–14.5)
WBC: 6.2 10*3/uL (ref 3.6–11.0)

## 2014-05-10 LAB — URINALYSIS, COMPLETE
BILIRUBIN, UR: NEGATIVE
GLUCOSE, UR: NEGATIVE mg/dL (ref 0–75)
Ketone: NEGATIVE
Nitrite: NEGATIVE
PROTEIN: NEGATIVE
Ph: 6 (ref 4.5–8.0)
RBC,UR: 5 /HPF (ref 0–5)
Specific Gravity: 1.018 (ref 1.003–1.030)
Squamous Epithelial: 10

## 2014-05-10 LAB — COMPREHENSIVE METABOLIC PANEL
ALK PHOS: 65 U/L
ALT: 25 U/L
AST: 22 U/L (ref 15–37)
Albumin: 3.3 g/dL — ABNORMAL LOW (ref 3.4–5.0)
Anion Gap: 7 (ref 7–16)
BILIRUBIN TOTAL: 0.2 mg/dL (ref 0.2–1.0)
BUN: 11 mg/dL (ref 7–18)
CHLORIDE: 106 mmol/L (ref 98–107)
CREATININE: 0.68 mg/dL (ref 0.60–1.30)
Calcium, Total: 8.4 mg/dL — ABNORMAL LOW (ref 8.5–10.1)
Co2: 27 mmol/L (ref 21–32)
EGFR (Non-African Amer.): >60
Glucose: 118 mg/dL — ABNORMAL HIGH (ref 65–99)
OSMOLALITY: 280 (ref 275–301)
POTASSIUM: 3.8 mmol/L (ref 3.5–5.1)
SODIUM: 140 mmol/L (ref 136–145)
Total Protein: 7 g/dL (ref 6.4–8.2)

## 2014-06-03 ENCOUNTER — Encounter (HOSPITAL_COMMUNITY): Payer: Self-pay | Admitting: Emergency Medicine

## 2015-01-07 ENCOUNTER — Encounter: Payer: Self-pay | Admitting: *Deleted

## 2015-01-07 ENCOUNTER — Emergency Department: Payer: Medicaid Other

## 2015-01-07 ENCOUNTER — Emergency Department
Admission: EM | Admit: 2015-01-07 | Discharge: 2015-01-07 | Disposition: A | Discharge status: Home / Self Care | Payer: Medicaid Other | Attending: Emergency Medicine | Admitting: Emergency Medicine

## 2015-01-07 DIAGNOSIS — I10 Essential (primary) hypertension: Secondary | ICD-10-CM | POA: Diagnosis not present

## 2015-01-07 DIAGNOSIS — M25561 Pain in right knee: Secondary | ICD-10-CM

## 2015-01-07 DIAGNOSIS — Z79899 Other long term (current) drug therapy: Secondary | ICD-10-CM | POA: Insufficient documentation

## 2015-01-07 DIAGNOSIS — Y9289 Other specified places as the place of occurrence of the external cause: Secondary | ICD-10-CM | POA: Diagnosis not present

## 2015-01-07 DIAGNOSIS — Y990 Civilian activity done for income or pay: Secondary | ICD-10-CM | POA: Diagnosis not present

## 2015-01-07 DIAGNOSIS — Y9389 Activity, other specified: Secondary | ICD-10-CM | POA: Insufficient documentation

## 2015-01-07 DIAGNOSIS — G7000 Myasthenia gravis without (acute) exacerbation: Secondary | ICD-10-CM | POA: Diagnosis not present

## 2015-01-07 DIAGNOSIS — X58XXXA Exposure to other specified factors, initial encounter: Secondary | ICD-10-CM | POA: Insufficient documentation

## 2015-01-07 DIAGNOSIS — S8991XA Unspecified injury of right lower leg, initial encounter: Secondary | ICD-10-CM | POA: Diagnosis not present

## 2015-01-07 DIAGNOSIS — Z87891 Personal history of nicotine dependence: Secondary | ICD-10-CM | POA: Diagnosis not present

## 2015-01-07 MED ORDER — IBUPROFEN 800 MG PO TABS
ORAL_TABLET | ORAL | Status: AC
  Administered 2015-01-07: 800 mg via ORAL
  Filled 2015-01-07: qty 1

## 2015-01-07 MED ORDER — OXYCODONE-ACETAMINOPHEN 5-325 MG PO TABS
1.0000 | ORAL_TABLET | Freq: Once | ORAL | Status: AC
  Administered 2015-01-07: 1 via ORAL

## 2015-01-07 MED ORDER — OXYCODONE-ACETAMINOPHEN 5-325 MG PO TABS
ORAL_TABLET | ORAL | Status: AC
  Administered 2015-01-07: 1 via ORAL
  Filled 2015-01-07: qty 1

## 2015-01-07 MED ORDER — IBUPROFEN 800 MG PO TABS: 800.0000 mg | ORAL_TABLET | Freq: Three times a day (TID) | ORAL | Status: DC | PRN

## 2015-01-07 MED ORDER — IBUPROFEN 800 MG PO TABS
800.0000 mg | ORAL_TABLET | Freq: Once | ORAL | Status: AC
  Administered 2015-01-07: 800 mg via ORAL

## 2015-01-07 MED ORDER — OXYCODONE-ACETAMINOPHEN 5-325 MG PO TABS: 1.0000 | ORAL_TABLET | Freq: Three times a day (TID) | ORAL | Status: DC | PRN

## 2015-01-07 NOTE — ED Notes (Signed)
Patient transported to Ultrasound 

## 2015-01-07 NOTE — Discharge Instructions (Signed)
Take medication as prescribed. Rest. Apply ice. Avoid strenuous activity. Wear knee immobilizer as long as pain continues.   As discussed follow-up with orthopedic next week as needed for continued pain. See above to call to schedule.  Return to the ER for new or worsening concerns.  Knee Pain The knee is the complex joint between your thigh and your lower leg. It is made up of bones, tendons, ligaments, and cartilage. The bones that make up the knee are:  The femur in the thigh.  The tibia and fibula in the lower leg.  The patella or kneecap riding in the groove on the lower femur. CAUSES  Knee pain is a common complaint with many causes. A few of these causes are:  Injury, such as:  A ruptured ligament or tendon injury.  Torn cartilage.  Medical conditions, such as:  Gout  Arthritis  Infections  Overuse, over training, or overdoing a physical activity. Knee pain can be minor or severe. Knee pain can accompany debilitating injury. Minor knee problems often respond well to self-care measures or get well on their own. More serious injuries may need medical intervention or even surgery. SYMPTOMS The knee is complex. Symptoms of knee problems can vary widely. Some of the problems are:  Pain with movement and weight bearing.  Swelling and tenderness.  Buckling of the knee.  Inability to straighten or extend your knee.  Your knee locks and you cannot straighten it.  Warmth and redness with pain and fever.  Deformity or dislocation of the kneecap. DIAGNOSIS  Determining what is wrong may be very straight forward such as when there is an injury. It can also be challenging because of the complexity of the knee. Tests to make a diagnosis may include:  Your caregiver taking a history and doing a physical exam.  Routine X-rays can be used to rule out other problems. X-rays will not reveal a cartilage tear. Some injuries of the knee can be diagnosed by:  Arthroscopy a  surgical technique by which a small video camera is inserted through tiny incisions on the sides of the knee. This procedure is used to examine and repair internal knee joint problems. Tiny instruments can be used during arthroscopy to repair the torn knee cartilage (meniscus).  Arthrography is a radiology technique. A contrast liquid is directly injected into the knee joint. Internal structures of the knee joint then become visible on X-ray film.  An MRI scan is a non X-ray radiology procedure in which magnetic fields and a computer produce two- or three-dimensional images of the inside of the knee. Cartilage tears are often visible using an MRI scanner. MRI scans have largely replaced arthrography in diagnosing cartilage tears of the knee.  Blood work.  Examination of the fluid that helps to lubricate the knee joint (synovial fluid). This is done by taking a sample out using a needle and a syringe. TREATMENT The treatment of knee problems depends on the cause. Some of these treatments are:  Depending on the injury, proper casting, splinting, surgery, or physical therapy care will be needed.  Give yourself adequate recovery time. Do not overuse your joints. If you begin to get sore during workout routines, back off. Slow down or do fewer repetitions.  For repetitive activities such as cycling or running, maintain your strength and nutrition.  Alternate muscle groups. For example, if you are a weight lifter, work the upper body on one day and the lower body the next.  Either tight or weak  muscles do not give the proper support for your knee. Tight or weak muscles do not absorb the stress placed on the knee joint. Keep the muscles surrounding the knee strong.  Take care of mechanical problems.  If you have flat feet, orthotics or special shoes may help. See your caregiver if you need help.  Arch supports, sometimes with wedges on the inner or outer aspect of the heel, can help. These can  shift pressure away from the side of the knee most bothered by osteoarthritis.  A brace called an "unloader" brace also may be used to help ease the pressure on the most arthritic side of the knee.  If your caregiver has prescribed crutches, braces, wraps or ice, use as directed. The acronym for this is PRICE. This means protection, rest, ice, compression, and elevation.  Nonsteroidal anti-inflammatory drugs (NSAIDs), can help relieve pain. But if taken immediately after an injury, they may actually increase swelling. Take NSAIDs with food in your stomach. Stop them if you develop stomach problems. Do not take these if you have a history of ulcers, stomach pain, or bleeding from the bowel. Do not take without your caregiver's approval if you have problems with fluid retention, heart failure, or kidney problems.  For ongoing knee problems, physical therapy may be helpful.  Glucosamine and chondroitin are over-the-counter dietary supplements. Both may help relieve the pain of osteoarthritis in the knee. These medicines are different from the usual anti-inflammatory drugs. Glucosamine may decrease the rate of cartilage destruction.  Injections of a corticosteroid drug into your knee joint may help reduce the symptoms of an arthritis flare-up. They may provide pain relief that lasts a few months. You may have to wait a few months between injections. The injections do have a small increased risk of infection, water retention, and elevated blood sugar levels.  Hyaluronic acid injected into damaged joints may ease pain and provide lubrication. These injections may work by reducing inflammation. A series of shots may give relief for as long as 6 months.  Topical painkillers. Applying certain ointments to your skin may help relieve the pain and stiffness of osteoarthritis. Ask your pharmacist for suggestions. Many over the-counter products are approved for temporary relief of arthritis pain.  In some  countries, doctors often prescribe topical NSAIDs for relief of chronic conditions such as arthritis and tendinitis. A review of treatment with NSAID creams found that they worked as well as oral medications but without the serious side effects. PREVENTION  Maintain a healthy weight. Extra pounds put more strain on your joints.  Get strong, stay limber. Weak muscles are a common cause of knee injuries. Stretching is important. Include flexibility exercises in your workouts.  Be smart about exercise. If you have osteoarthritis, chronic knee pain or recurring injuries, you may need to change the way you exercise. This does not mean you have to stop being active. If your knees ache after jogging or playing basketball, consider switching to swimming, water aerobics, or other low-impact activities, at least for a few days a week. Sometimes limiting high-impact activities will provide relief.  Make sure your shoes fit well. Choose footwear that is right for your sport.  Protect your knees. Use the proper gear for knee-sensitive activities. Use kneepads when playing volleyball or laying carpet. Buckle your seat belt every time you drive. Most shattered kneecaps occur in car accidents.  Rest when you are tired. SEEK MEDICAL CARE IF:  You have knee pain that is continual and does not  seem to be getting better.  SEEK IMMEDIATE MEDICAL CARE IF:  Your knee joint feels hot to the touch and you have a high fever. MAKE SURE YOU:   Understand these instructions.  Will watch your condition.  Will get help right away if you are not doing well or get worse. Document Released: 05/16/2007 Document Revised: 10/11/2011 Document Reviewed: 05/16/2007 [facility] Patient Information 2015 [place], [place]. This information is not intended to replace advice given to you by your health care provider. Make sure you discuss any questions you have with your health care provider.

## 2015-01-07 NOTE — ED Notes (Signed)
Pt reports pain to right knee for the last two weeks

## 2015-01-07 NOTE — ED Provider Notes (Signed)
[facility] Emergency Department Provider Note  ____________________________________________  Time seen: Approximately 1:29 PM  I have reviewed the triage vital signs and the nursing notes.   HISTORY  Chief Complaint Knee Pain   HPI Terri Chambers is a 39 y.o. female presents to ER for complaint of 2 weeks of knee pain. States right knee has been hurting her for approximate 2 weeks and states walking with pain for 2 weeks. Patient states that her the first week she was really unable to bend her right knee however this weekend while at work she twisted and bent her right knee and then she heard a loud pop and all of a sudden was able to walk better. Patient states at that time her pain dramatically improved. States she thought that the pain was going away. Denies left leg pain.   States however in the last 2 days she has had increased pain. States pain is in the back of her knee and in the side of her knee. also states that she's had intermittent swelling and right lower leg last week which is fully resolved now. States may have been from standing too long. States pain currently 5 out of 10 aching and hurts with bending. States pain is primarily with movement.  Denies known fall or injury but states stands all day at work. Denies chest pain or shortness of breath, weakness, numbness, abdominal pain or other complaints.    Past Medical History  Diagnosis Date  . Myasthenia gravis   . Obesity   . Migraine headache   . Depression   . Hypertension   . History of asthma     Patient Active Problem List   Diagnosis Date Noted  . Encounter for therapeutic drug monitoring 10/02/2012  . Myasthenia gravis without exacerbation 10/02/2012  . Myasthenia gravis with exacerbation 10/02/2012    Past Surgical History  Procedure Laterality Date  . Ear tube removal      Current Outpatient Rx  Name  Route  Sig  Dispense  Refill  . albuterol (PROVENTIL HFA;VENTOLIN  HFA) 108 (90 BASE) MCG/ACT inhaler   Inhalation   Inhale 2 puffs into the lungs 2 (two) times daily as needed for wheezing or shortness of breath.         . mycophenolate (CELLCEPT) 500 MG tablet   Oral   Take 500 mg by mouth 2 (two) times daily.         Marland Kitchen pyridostigmine (MESTINON) 60 MG tablet   Oral   Take 60 mg by mouth 2 (two) times daily.          . SUMAtriptan (IMITREX) 100 MG tablet   Oral   Take 100 mg by mouth every 2 (two) hours as needed for migraine or headache. May repeat in 2 hours if headache persists or recurs.         "blood pressure medication"  Allergies Review of patient's allergies indicates no known allergies.  Family History  Problem Relation Age of Onset  . Other Neg Hx   . Diabetes Mother   . Arthritis Father   . Hypertension Father   . Cerebral aneurysm Maternal Grandmother   . Congestive Heart Failure Paternal Grandmother   . Cervical cancer Other     Maternal great-grandmother died of cervical cancer    Social History History  Substance Use Topics  . Smoking status: Former Smoker    Quit date: 08/02/2008  . Smokeless tobacco: Not on file  . Alcohol Use:  No    Review of Systems Constitutional: No fever/chills Eyes: No visual changes. ENT: No sore throat. Cardiovascular: Denies chest pain. Respiratory: Denies shortness of breath. Gastrointestinal: No abdominal pain.  No nausea, no vomiting.  No diarrhea.  No constipation. Genitourinary: Negative for dysuria. Musculoskeletal: Negative for back pain. Positive right knee pain. Skin: Negative for rash. Neurological: Negative for headaches, focal weakness or numbness.  10-point ROS otherwise negative.  ____________________________________________   PHYSICAL EXAM:  VITAL SIGNS: ED Triage Vitals  Enc Vitals Group     BP 01/07/15 1207 155/100 mmHg     Pulse Rate 01/07/15 1205 86     Resp --      Temp 01/07/15 1205 98.4 F (36.9 C)     Temp Source 01/07/15 1205 Oral      SpO2 01/07/15 1205 96 %     Weight 01/07/15 1205 270 lb (122.471 kg)     Height 01/07/15 1205 5\' 8"  (1.727 m)     Head Cir --      Peak Flow --      Pain Score 01/07/15 1206 8     Pain Loc --      Pain Edu? --      Excl. in [place]?  --    Filed Vitals:   01/07/15 1723  BP: 168/117  Pulse: 81  Temp: 98.8 F (37.1 C)  Resp: 18    .  Constitutional: Alert and oriented. Well appearing and in no acute distress. Eyes: Conjunctivae are normal. PERRL. EOMI. Head: Atraumatic. Nose: No congestion/rhinnorhea. Mouth/Throat: Mucous membranes are moist.  Oropharynx non-erythematous. Neck: No stridor.  No cervical spine tenderness to palpation. Hematological/Lymphatic/Immunilogical: No cervical lymphadenopathy. Cardiovascular: Normal rate, regular rhythm. Grossly normal heart sounds.  Good peripheral circulation. Respiratory: Normal respiratory effort.  No retractions. Lungs CTAB. Gastrointestinal: Soft and nontender. No distention. No abdominal bruits. No CVA tenderness. Musculoskeletal: No lower extremity tenderness nor edema.  No joint effusions. Except: Right knee mild to mod pain with anterior drawer and medial stress test. Mild TTP lateral and anterior knee. No swelling or ecchymosis. Minimal TTP posterior knee. Minimal TTP right posterior calf. No swelling. Bilateral pedal pulses equal and easily found. No lower extremities swelling bilaterally. Full range of motion. Normal coloration. No signs of ischemia. Skin intact. 5/5 strength to bilateral upper and lower extremities. No antalgic gait. Neurologic:  Normal speech and language. No gross focal neurologic deficits are appreciated. Speech is normal. No gait instability. Skin:  Skin is warm, dry and intact. No rash noted. Psychiatric: Mood and affect are normal. Speech and behavior are normal.   RADIOLOGY  RIGHT LOWER EXTREMITY VENOUS DOPPLER ULTRASOUND  TECHNIQUE: Gray-scale sonography with graded compression, as well as  color Doppler and duplex ultrasound were performed to evaluate the lower extremity deep venous systems from the level of the common femoral vein and including the common femoral, femoral, profunda femoral, popliteal and calf veins including the posterior tibial, peroneal and gastrocnemius veins when visible. The superficial great saphenous vein was also interrogated. Spectral Doppler was utilized to evaluate flow at rest and with distal augmentation maneuvers in the common femoral, femoral and popliteal veins.  COMPARISON: None  FINDINGS: Contralateral Common Femoral Vein: Respiratory phasicity is normal and symmetric with the symptomatic side. No evidence of thrombus. Normal compressibility.  Common Femoral Vein: No evidence of thrombus. Normal compressibility, respiratory phasicity and response to augmentation.  Saphenofemoral Junction: No evidence of thrombus. Normal compressibility and flow on color Doppler imaging.  Profunda Femoral Vein:  No evidence of thrombus. Normal compressibility and flow on color Doppler imaging.  Femoral Vein: No evidence of thrombus. Normal compressibility, respiratory phasicity and response to augmentation.  Popliteal Vein: No evidence of thrombus. Normal compressibility, respiratory phasicity and response to augmentation.  Calf Veins: No evidence of thrombus. Normal compressibility and flow on color Doppler imaging.  Superficial Great Saphenous Vein: No evidence of thrombus. Normal compressibility and flow on color Doppler imaging.  Venous Reflux: None.  Other Findings: None.  IMPRESSION: No evidence of deep venous thrombosis in RIGHT lower extremity.   Electronically Signed By: Lavonia Dana M.D. On: 01/07/2015 16:10      RIGHT KNEE - COMPLETE 4+ VIEW  COMPARISON: None.  FINDINGS: Mild spurring throughout the right knee. No acute bony abnormality. Specifically, no fracture, subluxation, or dislocation. Soft  tissues are intact. Joint spaces maintained. Possible small joint effusion.  IMPRESSION: Mild spurring and small joint effusion. No acute bony abnormality.   Electronically Signed By: Rolm Baptise M.D. On: 01/07/2015 16:38   SPLINT APPLICATION [id number] Authorized by: Marylene Land Consent: Verbal consent obtained. Risks and benefits: risks, benefits and alternatives were discussed Consent given by: patient Splint applied by: edtechnician Location details right knee Splint type: right knee immobilizer and crutches Post-procedure: The splinted body part was neurovascularly unchanged following the procedure. Patient tolerance: Patient tolerated the procedure well with no immediate complications.   ____________________________________________   INITIAL IMPRESSION / ASSESSMENT AND PLAN / ED COURSE  Pertinent labs & imaging results that were available during my care of the patient were reviewed by me and considered in my medical decision making (see chart for details).  Very well-appearing patient. Sitting on side of the bed without distress. Changes positions quickly without distress or difficulty. Complaints of right anterior and right lateral knee pain with intermittent right posterior knee and right calf pain. We'll evaluate x-ray and ultrasound for further evaluation. Patient agreed to plan.  1500: Awaiting ultrasound. Patient well-appearing states pain currently 3 out of 10 to right knee.  1610: Ultrasound of right lower extremity negative. Ultrasound no evidence of deep venous thrombosis in right lower extremity. Right knee x-ray showing mild spurring and small joint effusion no acute bony abnormality.Well appearing patient presents for complaints of 2 weeks of right knee pain. Denies known fall or trauma. Suspect calf pain due to muscular strain during antalgic gait at initial pain onset. Discussed patient to rest, ice. Will apply a knee immobilizer and use crutches  for stability. Patient to follow-up with orthopedic for continued knee pain and further follow-up due to concern for internal injury. Patient agreed to plan. ____________________________________________   FINAL CLINICAL IMPRESSION(S) / ED DIAGNOSES  Final diagnoses:  Right knee pain      Marylene Land, NP 01/07/15 [place], NP 01/07/15 1811  Carrie Mew, MD 01/07/15 2135

## 2015-01-07 NOTE — ED Notes (Signed)
Called times 2  Pt is in dining room with child.

## 2015-05-05 DIAGNOSIS — H538 Other visual disturbances: Secondary | ICD-10-CM | POA: Insufficient documentation

## 2015-05-05 DIAGNOSIS — R1310 Dysphagia, unspecified: Secondary | ICD-10-CM | POA: Insufficient documentation

## 2015-06-02 ENCOUNTER — Emergency Department
Admission: EM | Admit: 2015-06-02 | Discharge: 2015-06-02 | Disposition: A | Discharge status: Home / Self Care | Payer: Medicaid Other | Attending: Emergency Medicine | Admitting: Emergency Medicine

## 2015-06-02 ENCOUNTER — Encounter: Payer: Self-pay | Admitting: Emergency Medicine

## 2015-06-02 DIAGNOSIS — G43809 Other migraine, not intractable, without status migrainosus: Secondary | ICD-10-CM | POA: Diagnosis not present

## 2015-06-02 DIAGNOSIS — M25561 Pain in right knee: Secondary | ICD-10-CM | POA: Insufficient documentation

## 2015-06-02 DIAGNOSIS — M25562 Pain in left knee: Secondary | ICD-10-CM | POA: Diagnosis not present

## 2015-06-02 DIAGNOSIS — Z87891 Personal history of nicotine dependence: Secondary | ICD-10-CM | POA: Diagnosis not present

## 2015-06-02 DIAGNOSIS — Z79899 Other long term (current) drug therapy: Secondary | ICD-10-CM | POA: Diagnosis not present

## 2015-06-02 DIAGNOSIS — R51 Headache: Secondary | ICD-10-CM | POA: Diagnosis present

## 2015-06-02 DIAGNOSIS — I10 Essential (primary) hypertension: Secondary | ICD-10-CM | POA: Diagnosis not present

## 2015-06-02 NOTE — Discharge Instructions (Signed)
1. You may remove ace wrap as needed. 2. Return to the ER for worsening symptoms, persistent vomiting, difficulty breathing or other concerns.  Migraine Headache A migraine headache is an intense, throbbing pain on one or both sides of your head. A migraine can last for 30 minutes to several hours. CAUSES  The exact cause of a migraine headache is not always known. However, a migraine may be caused when nerves in the brain become irritated and release chemicals that cause inflammation. This causes pain. Certain things may also trigger migraines, such as:  Alcohol.  Smoking.  Stress.  Menstruation.  Aged cheeses.  Foods or drinks that contain nitrates, glutamate, aspartame, or tyramine.  Lack of sleep.  Chocolate.  Caffeine.  Hunger.  Physical exertion.  Fatigue.  Medicines used to treat chest pain (nitroglycerine), birth control pills, estrogen, and some blood pressure medicines. SIGNS AND SYMPTOMS  Pain on one or both sides of your head.  Pulsating or throbbing pain.  Severe pain that prevents daily activities.  Pain that is aggravated by any physical activity.  Nausea, vomiting, or both.  Dizziness.  Pain with exposure to bright lights, loud noises, or activity.  General sensitivity to bright lights, loud noises, or smells. Before you get a migraine, you may get warning signs that a migraine is coming (aura). An aura may include:  Seeing flashing lights.  Seeing bright spots, halos, or zigzag lines.  Having tunnel vision or blurred vision.  Having feelings of numbness or tingling.  Having trouble talking.  Having muscle weakness. DIAGNOSIS  A migraine headache is often diagnosed based on:  Symptoms.  Physical exam.  A CT scan or MRI of your head. These imaging tests cannot diagnose migraines, but they can help rule out other causes of headaches. TREATMENT Medicines may be given for pain and nausea. Medicines can also be given to help prevent  recurrent migraines.  HOME CARE INSTRUCTIONS  Only take over-the-counter or prescription medicines for pain or discomfort as directed by your health care provider. The use of long-term narcotics is not recommended.  Lie down in a dark, quiet room when you have a migraine.  Keep a journal to find out what may trigger your migraine headaches. For example, write down:  What you eat and drink.  How much sleep you get.  Any change to your diet or medicines.  Limit alcohol consumption.  Quit smoking if you smoke.  Get 7-9 hours of sleep, or as recommended by your health care provider.  Limit stress.  Keep lights dim if bright lights bother you and make your migraines worse. SEEK IMMEDIATE MEDICAL CARE IF:   Your migraine becomes severe.  You have a fever.  You have a stiff neck.  You have vision loss.  You have muscular weakness or loss of muscle control.  You start losing your balance or have trouble walking.  You feel faint or pass out.  You have severe symptoms that are different from your first symptoms. MAKE SURE YOU:   Understand these instructions.  Will watch your condition.  Will get help right away if you are not doing well or get worse.   This information is not intended to replace advice given to you by your health care provider. Make sure you discuss any questions you have with your health care provider.   Document Released: 07/19/2005 Document Revised: 08/09/2014 Document Reviewed: 03/26/2013 Elsevier Interactive Patient Education 2016 Else[employer]  Knee Pain Knee pain is a very common symptom and  can have many causes. Knee pain often goes away when you follow your health care provider's instructions for relieving pain and discomfort at home. However, knee pain can develop into a condition that needs treatment. Some conditions may include:  Arthritis caused by wear and tear (osteoarthritis).  Arthritis caused by swelling and irritation (rheumatoid  arthritis or gout).  A cyst or growth in your knee.  An infection in your knee joint.  An injury that will not heal.  Damage, swelling, or irritation of the tissues that support your knee (torn ligaments or tendinitis). If your knee pain continues, additional tests may be ordered to diagnose your condition. Tests may include X-rays or other imaging studies of your knee. You may also need to have fluid removed from your knee. Treatment for ongoing knee pain depends on the cause, but treatment may include:  Medicines to relieve pain or swelling.  Steroid injections in your knee.  Physical therapy.  Surgery. HOME CARE INSTRUCTIONS  Take medicines only as directed by your health care provider.  Rest your knee and keep it raised (elevated) while you are resting.  Do not do things that cause or worsen pain.  Avoid high-impact activities or exercises, such as running, jumping rope, or doing jumping jacks.  Apply ice to the knee area:  Put ice in a plastic bag.  Place a towel between your skin and the bag.  Leave the ice on for 20 minutes, 2-3 times a day.  Ask your health care provider if you should wear an elastic knee support.  Keep a pillow under your knee when you sleep.  Lose weight if you are overweight. Extra weight can put pressure on your knee.  Do not use any tobacco products, including cigarettes, chewing tobacco, or electronic cigarettes. If you need help quitting, ask your health care provider. Smoking may slow the healing of any bone and joint problems that you may have. SEEK MEDICAL CARE IF:  Your knee pain continues, changes, or gets worse.  You have a fever along with knee pain.  Your knee buckles or locks up.  Your knee becomes more swollen. SEEK IMMEDIATE MEDICAL CARE IF:   Your knee joint feels hot to the touch.  You have chest pain or trouble breathing.   This information is not intended to replace advice given to you by your health care  provider. Make sure you discuss any questions you have with your health care provider.   Document Released: 05/16/2007 Document Revised: 08/09/2014 Document Reviewed: 03/04/2014 Elsevier Interactive Patient Education [employer].

## 2015-06-02 NOTE — ED Notes (Signed)
Pt resting quietly in lobby with eyes closed, resp even and nonlabored

## 2015-06-02 NOTE — ED Notes (Addendum)
Patient reports headache for several hours that will not go away.  Also reports bilateral knee pain and swelling for several days.  Patient ambulatory to triage with no difficulty noted.  No acute distress noted.

## 2015-06-02 NOTE — ED Notes (Signed)
Pt dc home ambulatory instructed on follow up plan PT NAD AT DC

## 2015-06-02 NOTE — ED Provider Notes (Signed)
[facility] Emergency Department Provider Note  ____________________________________________  Time seen: Approximately 5:08 AM  I have reviewed the triage vital signs and the nursing notes.   HISTORY  Chief Complaint Headache and Knee Pain    HPI Terri Chambers is a 39 y.o. female who presents to the ED from home with a chief complaint of headache and bilateral knee pain. Patient has a history of migraines and reports typical migraine headache for several hours. Headache is global gradual onset. Symptoms not associated with vision changes, neck pain, nausea or vomiting. Also complains of bilateral knee pain for several days. States she dislocated her right patella several months ago and has been compensating for the pain by bearing more weight onto her left. States she feels more pain in her knees with weather changes. Denies recent travel or trauma.Nothing makes her pain better. Taking a nap in the waiting room made her headache resolved.   Past Medical History  Diagnosis Date  . Myasthenia gravis   . Obesity   . Migraine headache   . Depression   . Hypertension   . History of asthma     Patient Active Problem List   Diagnosis Date Noted  . Encounter for therapeutic drug monitoring 10/02/2012  . Myasthenia gravis without exacerbation ([place]) 10/02/2012  . Myasthenia gravis with exacerbation ([place]) 10/02/2012    Past Surgical History  Procedure Laterality Date  . Ear tube removal      Current Outpatient Rx  Name  Route  Sig  Dispense  Refill  . albuterol (PROVENTIL HFA;VENTOLIN HFA) 108 (90 BASE) MCG/ACT inhaler   Inhalation   Inhale 2 puffs into the lungs 2 (two) times daily as needed for wheezing or shortness of breath.         Marland Kitchen ibuprofen (ADVIL,MOTRIN) 800 MG tablet   Oral   Take 1 tablet (800 mg total) by mouth every 8 (eight) hours as needed for mild pain or moderate pain.   15 tablet   0   . mycophenolate (CELLCEPT) 500 MG tablet    Oral   Take 500 mg by mouth 2 (two) times daily.         Marland Kitchen pyridostigmine (MESTINON) 60 MG tablet   Oral   Take 60 mg by mouth 2 (two) times daily.          . SUMAtriptan (IMITREX) 100 MG tablet   Oral   Take 100 mg by mouth every 2 (two) hours as needed for migraine or headache. May repeat in 2 hours if headache persists or recurs.         Marland Kitchen oxyCODONE-acetaminophen (ROXICET) 5-325 MG per tablet   Oral   Take 1 tablet by mouth every 8 (eight) hours as needed for moderate pain or severe pain (Do not drive or operate heavy machinery while taking as can cause drowsiness.). Patient not taking: Reported on 06/02/2015   9 tablet   0     Allergies Review of patient's allergies indicates no known allergies.  Family History  Problem Relation Age of Onset  . Other Neg Hx   . Diabetes Mother   . Arthritis Father   . Hypertension Father   . Cerebral aneurysm Maternal Grandmother   . Congestive Heart Failure Paternal Grandmother   . Cervical cancer Other     Maternal great-grandmother died of cervical cancer    Social History Social History  Substance Use Topics  . Smoking status: Former Smoker    Quit date: 08/02/2008  .  Smokeless tobacco: None  . Alcohol Use: No    Review of Systems Constitutional: No fever/chills Eyes: No visual changes. ENT: No sore throat. Cardiovascular: Denies chest pain. Respiratory: Denies shortness of breath. Gastrointestinal: No abdominal pain.  No nausea, no vomiting.  No diarrhea.  No constipation. Genitourinary: Negative for dysuria. Musculoskeletal: Positive for bilateral knee pain. Negative for back pain. Skin: Negative for rash. Neurological: Positive for headache. Negative for focal weakness or numbness.  10-point ROS otherwise negative.  ____________________________________________   PHYSICAL EXAM:  VITAL SIGNS: ED Triage Vitals  Enc Vitals Group     BP 06/02/15 0203 135/93 mmHg     Pulse Rate 06/02/15 0203 88     Resp  06/02/15 0203 20     Temp 06/02/15 0203 98.4 F (36.9 C)     Temp Source 06/02/15 0203 Oral     SpO2 06/02/15 0203 96 %     Weight 06/02/15 0203 278 lb (126.1 kg)     Height 06/02/15 0203 5\' 6"  (1.676 m)     Head Cir --      Peak Flow --      Pain Score 06/02/15 0204 8     Pain Loc --      Pain Edu? --      Excl. in [place]? --     Constitutional: Alert and oriented. Well appearing and in no acute distress. Eyes: Conjunctivae are normal. PERRL. EOMI. Fundoscopy within normal limits. Head: Atraumatic. Nose: No congestion/rhinnorhea. Mouth/Throat: Mucous membranes are moist.  Oropharynx non-erythematous. Neck: No stridor.  No carotid bruits. Supple neck without evidence of meningismus. Cardiovascular: Normal rate, regular rhythm. Grossly normal heart sounds.  Good peripheral circulation. Respiratory: Normal respiratory effort.  No retractions. Lungs CTAB. Gastrointestinal: Soft and nontender. No distention. No abdominal bruits. No CVA tenderness. Musculoskeletal: No appreciable swelling to either knee. Right knee mildly painful on full range of motion. Right knee mildly tender to palpation anteriorly. 2+ distal pulses. Supple calves without evidence for compartment syndrome. Symmetrically warm limbs without evidence for ischemia. Neurologic:  Normal speech and language. No gross focal neurologic deficits are appreciated. No gait instability. Skin:  Skin is warm, dry and intact. No rash noted. Psychiatric: Mood and affect are normal. Speech and behavior are normal.  ____________________________________________   LABS (all labs ordered are listed, but only abnormal results are displayed)  Labs Reviewed - No data to display ____________________________________________  EKG  None ____________________________________________  RADIOLOGY  None ____________________________________________   PROCEDURES  Procedure(s) performed: None  Critical Care performed:  No  ____________________________________________   INITIAL IMPRESSION / ASSESSMENT AND PLAN / ED COURSE  Pertinent labs & imaging results that were available during my care of the patient were reviewed by me and considered in my medical decision making (see chart for details).  39 year old female who presents with migraine headache and bilateral knee pain. Headache completely resolved after a nap in the lobby. Will apply Ace wrap to right knee and refer patient for outpatient orthopedic follow-up. Patient mentions she would feel better if she had a work note for today. Strict return precautions given. Patient verbalizes understanding and agrees with plan of care. ____________________________________________   FINAL CLINICAL IMPRESSION(S) / ED DIAGNOSES  Final diagnoses:  Other migraine without status migrainosus, not intractable  Knee pain, bilateral      Paulette Blanch, MD 06/02/15 [phone]

## 2015-06-02 NOTE — ED Notes (Signed)
Ace wrap applied to right knee

## 2015-06-10 ENCOUNTER — Encounter: Payer: Self-pay | Admitting: *Deleted

## 2015-06-19 ENCOUNTER — Encounter: Payer: Self-pay | Admitting: General Surgery

## 2015-06-19 ENCOUNTER — Ambulatory Visit (INDEPENDENT_AMBULATORY_CARE_PROVIDER_SITE_OTHER): Payer: Medicaid Other | Admitting: General Surgery

## 2015-06-19 VITALS — BP 144/80 | HR 88 | Resp 12 | Ht 68.0 in | Wt 283.0 lb

## 2015-06-19 DIAGNOSIS — G7000 Myasthenia gravis without (acute) exacerbation: Secondary | ICD-10-CM

## 2015-06-19 NOTE — Patient Instructions (Addendum)
Follow up after CT of chest.  Patient has been scheduled for a CT chest WWO contrast at [place] for 06-25-15 at 1:30 pm (arrive 1:15 pm). Prep: NPO 4 hours prior.

## 2015-06-19 NOTE — Progress Notes (Signed)
Patient ID: Terri Chambers, female   DOB: 12/07/1975, 39 y.o.   MRN: [id number]  Chief Complaint  Patient presents with  . Other    thymus gland    HPI Terri Chambers is a 39 y.o. female here today for a evaluation of her thymus gland. Diagnosed with Myasthenia Gravis in 2005. Has been off medication for 2 years now, she does not want to take medication if possible. Has not had CT scan of chest at this time, insurance denied order.  I have reviewed the history of present illness with the patient. HPI  Past Medical History  Diagnosis Date  . Myasthenia gravis   . Obesity   . Migraine headache   . Depression   . Hypertension   . History of asthma     Past Surgical History  Procedure Laterality Date  . Ear tube removal      Family History  Problem Relation Age of Onset  . Other Neg Hx   . Diabetes Mother   . Arthritis Father   . Hypertension Father   . Cerebral aneurysm Maternal Grandmother   . Congestive Heart Failure Paternal Grandmother   . Cervical cancer Other     Maternal great-grandmother died of cervical cancer    Social History Social History  Substance Use Topics  . Smoking status: Former Smoker    Quit date: 08/02/2008  . Smokeless tobacco: None  . Alcohol Use: No    No Known Allergies  Current Outpatient Prescriptions  Medication Sig Dispense Refill  . albuterol (PROVENTIL HFA;VENTOLIN HFA) 108 (90 BASE) MCG/ACT inhaler Inhale 2 puffs into the lungs 2 (two) times daily as needed for wheezing or shortness of breath.    . SUMAtriptan (IMITREX) 100 MG tablet Take 100 mg by mouth every 2 (two) hours as needed for migraine or headache. May repeat in 2 hours if headache persists or recurs.    . mycophenolate (CELLCEPT) 500 MG tablet Take 500 mg by mouth 2 (two) times daily.    Marland Kitchen pyridostigmine (MESTINON) 60 MG tablet Take 60 mg by mouth 2 (two) times daily.      No current facility-administered medications for this visit.    Review of Systems Review of  Systems  Constitutional: Negative.   Respiratory: Negative.   Cardiovascular: Negative.     Blood pressure 144/80, pulse 88, resp. rate 12, height 5\' 8"  (1.727 m), weight 283 lb (128.368 kg), last menstrual period 06/17/2015.  Physical Exam Physical Exam  Constitutional: She is oriented to person, place, and time. She appears well-developed and well-nourished.  Eyes: Conjunctivae are normal. No scleral icterus.  Neck: Neck supple. No thyromegaly present.  Cardiovascular: Normal rate, regular rhythm and normal heart sounds.   Pulmonary/Chest: Effort normal and breath sounds normal.  Lymphadenopathy:    She has no cervical adenopathy.    She has no axillary adenopathy.  Neurological: She is alert and oriented to person, place, and time.  Skin: Skin is warm and dry.  Psychiatric: She has a normal mood and affect. Her behavior is normal.    Data Reviewed Progress notes reviewed, labs reviewed  Assessment    Myasthenia Gravis, by patient history she is not symptomatic at present. Likely won't need thymectomy unless she has failed medical treatment or there is evidence of thymoma.     Plan   Plan for CT chest in near future. Will discuss plan pending results.     Patient has been scheduled for a CT chest WWO contrast  at [facility] for 06-25-15 at 1:30 pm (arrive 1:15 pm). Prep: NPO 4 hours prior.   PCP:  Dickey Gave 06/19/2015, 4:08 PM

## 2015-06-25 ENCOUNTER — Ambulatory Visit
Admission: RE | Admit: 2015-06-25 | Discharge: 2015-06-25 | Disposition: A | Discharge status: Home / Self Care | Payer: Medicaid Other | Source: Ambulatory Visit | Attending: General Surgery | Admitting: General Surgery

## 2015-06-25 DIAGNOSIS — G7000 Myasthenia gravis without (acute) exacerbation: Secondary | ICD-10-CM | POA: Insufficient documentation

## 2015-06-25 HISTORY — DX: Unspecified asthma, uncomplicated: J45.909

## 2015-06-25 MED ORDER — IOHEXOL 350 MG/ML SOLN
75.0000 mL | Freq: Once | INTRAVENOUS | Status: AC | PRN
  Administered 2015-06-25: 75 mL via INTRAVENOUS

## 2016-12-12 ENCOUNTER — Emergency Department: Payer: Managed Care, Other (non HMO)

## 2016-12-12 ENCOUNTER — Emergency Department
Admission: EM | Admit: 2016-12-12 | Discharge: 2016-12-12 | Disposition: A | Discharge status: Home / Self Care | Payer: Managed Care, Other (non HMO) | Attending: Emergency Medicine | Admitting: Emergency Medicine

## 2016-12-12 DIAGNOSIS — S99921A Unspecified injury of right foot, initial encounter: Secondary | ICD-10-CM | POA: Diagnosis present

## 2016-12-12 DIAGNOSIS — Z79899 Other long term (current) drug therapy: Secondary | ICD-10-CM | POA: Insufficient documentation

## 2016-12-12 DIAGNOSIS — Y939 Activity, unspecified: Secondary | ICD-10-CM | POA: Diagnosis not present

## 2016-12-12 DIAGNOSIS — Z87891 Personal history of nicotine dependence: Secondary | ICD-10-CM | POA: Insufficient documentation

## 2016-12-12 DIAGNOSIS — Y92410 Unspecified street and highway as the place of occurrence of the external cause: Secondary | ICD-10-CM | POA: Insufficient documentation

## 2016-12-12 DIAGNOSIS — S92514A Nondisplaced fracture of proximal phalanx of right lesser toe(s), initial encounter for closed fracture: Secondary | ICD-10-CM | POA: Insufficient documentation

## 2016-12-12 DIAGNOSIS — I10 Essential (primary) hypertension: Secondary | ICD-10-CM | POA: Diagnosis not present

## 2016-12-12 DIAGNOSIS — Y999 Unspecified external cause status: Secondary | ICD-10-CM | POA: Insufficient documentation

## 2016-12-12 DIAGNOSIS — J45909 Unspecified asthma, uncomplicated: Secondary | ICD-10-CM | POA: Diagnosis not present

## 2016-12-12 MED ORDER — IBUPROFEN 800 MG PO TABS
800.0000 mg | ORAL_TABLET | Freq: Once | ORAL | Status: AC
  Administered 2016-12-12: 800 mg via ORAL

## 2016-12-12 MED ORDER — IBUPROFEN 800 MG PO TABS
ORAL_TABLET | ORAL | Status: AC
  Administered 2016-12-12: 800 mg via ORAL
  Filled 2016-12-12: qty 1

## 2016-12-12 MED ORDER — IBUPROFEN 800 MG PO TABS: 800.0000 mg | ORAL_TABLET | Freq: Three times a day (TID) | ORAL | 0 refills | Status: DC | PRN
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
## 2016-12-12 NOTE — Discharge Instructions (Signed)
Please buddy tape the second and third toe, use postop shoe to help with pain during ambulation. Use crutches as needed. If no improvement in one week, follow-up with podiatry. Take ibuprofen as needed for pain. Ice 20 minutes every hour for the next 2-3 days. Return to the ER for any worsening symptoms urgent changes in her health.
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
## 2016-12-23 DIAGNOSIS — R9431 Abnormal electrocardiogram [ECG] [EKG]: Secondary | ICD-10-CM | POA: Insufficient documentation

## 2017-02-19 ENCOUNTER — Encounter (HOSPITAL_COMMUNITY): Payer: Self-pay | Admitting: *Deleted

## 2017-02-19 ENCOUNTER — Emergency Department (HOSPITAL_COMMUNITY): Payer: Managed Care, Other (non HMO)

## 2017-02-19 ENCOUNTER — Emergency Department (HOSPITAL_COMMUNITY)
Admission: EM | Admit: 2017-02-19 | Discharge: 2017-02-19 | Disposition: A | Discharge status: Home / Self Care | Payer: Managed Care, Other (non HMO) | Attending: Emergency Medicine | Admitting: Emergency Medicine

## 2017-02-19 DIAGNOSIS — E876 Hypokalemia: Secondary | ICD-10-CM | POA: Insufficient documentation

## 2017-02-19 DIAGNOSIS — Z79899 Other long term (current) drug therapy: Secondary | ICD-10-CM | POA: Insufficient documentation

## 2017-02-19 DIAGNOSIS — Z87891 Personal history of nicotine dependence: Secondary | ICD-10-CM | POA: Diagnosis not present

## 2017-02-19 DIAGNOSIS — I4581 Long QT syndrome: Secondary | ICD-10-CM | POA: Diagnosis not present

## 2017-02-19 DIAGNOSIS — R1084 Generalized abdominal pain: Secondary | ICD-10-CM | POA: Diagnosis present

## 2017-02-19 DIAGNOSIS — R9431 Abnormal electrocardiogram [ECG] [EKG]: Secondary | ICD-10-CM

## 2017-02-19 DIAGNOSIS — J45909 Unspecified asthma, uncomplicated: Secondary | ICD-10-CM | POA: Diagnosis not present

## 2017-02-19 DIAGNOSIS — I10 Essential (primary) hypertension: Secondary | ICD-10-CM | POA: Diagnosis not present

## 2017-02-19 DIAGNOSIS — R0600 Dyspnea, unspecified: Secondary | ICD-10-CM | POA: Diagnosis not present

## 2017-02-19 LAB — COMPREHENSIVE METABOLIC PANEL
ALK PHOS: 52 U/L (ref 38–126)
ALT: 22 U/L (ref 14–54)
AST: 30 U/L (ref 15–41)
Albumin: 3.5 g/dL (ref 3.5–5.0)
Anion gap: 9 (ref 5–15)
BILIRUBIN TOTAL: 0.6 mg/dL (ref 0.3–1.2)
BUN: 7 mg/dL (ref 6–20)
CALCIUM: 9.2 mg/dL (ref 8.9–10.3)
CO2: 28 mmol/L (ref 22–32)
Chloride: 101 mmol/L (ref 101–111)
Creatinine, Ser: 0.76 mg/dL (ref 0.44–1.00)
GFR calc non Af Amer: >60 mL/min (ref >60)
Glucose, Bld: 148 mg/dL — ABNORMAL HIGH (ref 65–99)
Potassium: 2.6 mmol/L — CL (ref 3.5–5.1)
SODIUM: 138 mmol/L (ref 135–145)
Total Protein: 7 g/dL (ref 6.5–8.1)

## 2017-02-19 LAB — CBC
HCT: 38.6 % (ref 36.0–46.0)
Hemoglobin: 12.8 g/dL (ref 12.0–15.0)
MCH: 28.5 pg (ref 26.0–34.0)
MCHC: 33.2 g/dL (ref 30.0–36.0)
MCV: 86 fL (ref 78.0–100.0)
PLATELETS: 340 10*3/uL (ref 150–400)
RBC: 4.49 MIL/uL (ref 3.87–5.11)
RDW: 13.8 % (ref 11.5–15.5)
WBC: 8.1 10*3/uL (ref 4.0–10.5)

## 2017-02-19 LAB — URINALYSIS, ROUTINE W REFLEX MICROSCOPIC
BILIRUBIN URINE: NEGATIVE
Glucose, UA: NEGATIVE mg/dL
Hgb urine dipstick: NEGATIVE
Ketones, ur: NEGATIVE mg/dL
Leukocytes, UA: NEGATIVE
Nitrite: NEGATIVE
PROTEIN: NEGATIVE mg/dL
Specific Gravity, Urine: 1.018 (ref 1.005–1.030)
pH: 7 (ref 5.0–8.0)

## 2017-02-19 LAB — I-STAT CHEM 8, ED
BUN: 9 mg/dL (ref 6–20)
CALCIUM ION: 1.13 mmol/L — AB (ref 1.15–1.40)
CHLORIDE: 97 mmol/L — AB (ref 101–111)
Creatinine, Ser: 0.9 mg/dL (ref 0.44–1.00)
Glucose, Bld: 105 mg/dL — ABNORMAL HIGH (ref 65–99)
HEMATOCRIT: 35 % — AB (ref 36.0–46.0)
Hemoglobin: 11.9 g/dL — ABNORMAL LOW (ref 12.0–15.0)
Potassium: 2.9 mmol/L — ABNORMAL LOW (ref 3.5–5.1)
SODIUM: 140 mmol/L (ref 135–145)
TCO2: 29 mmol/L (ref 0–100)

## 2017-02-19 LAB — D-DIMER, QUANTITATIVE (NOT AT ARMC): D DIMER QUANT: 1.29 ug{FEU}/mL — AB (ref 0.00–0.50)

## 2017-02-19 LAB — POC URINE PREG, ED: PREG TEST UR: NEGATIVE

## 2017-02-19 LAB — LIPASE, BLOOD: Lipase: 23 U/L (ref 11–51)

## 2017-02-19 LAB — MAGNESIUM: Magnesium: 1.9 mg/dL (ref 1.7–2.4)

## 2017-02-19 LAB — TROPONIN I: Troponin I: <0.03 ng/mL (ref <0.03)

## 2017-02-19 MED ORDER — POTASSIUM CHLORIDE 10 MEQ/100ML IV SOLN
10.0000 meq | Freq: Once | INTRAVENOUS | Status: AC
  Administered 2017-02-19: 10 meq via INTRAVENOUS
  Filled 2017-02-19: qty 100

## 2017-02-19 MED ORDER — AMLODIPINE BESYLATE 10 MG PO TABS: 10.0000 mg | ORAL_TABLET | Freq: Every day | ORAL | 0 refills | Status: DC

## 2017-02-19 MED ORDER — KETOROLAC TROMETHAMINE 15 MG/ML IJ SOLN
15.0000 mg | Freq: Once | INTRAMUSCULAR | Status: AC
  Administered 2017-02-19: 15 mg via INTRAVENOUS
  Filled 2017-02-19: qty 1

## 2017-02-19 MED ORDER — POTASSIUM CHLORIDE CRYS ER 20 MEQ PO TBCR
40.0000 meq | EXTENDED_RELEASE_TABLET | Freq: Once | ORAL | Status: AC
  Administered 2017-02-19: 40 meq via ORAL
  Filled 2017-02-19: qty 2

## 2017-02-19 MED ORDER — IOPAMIDOL (ISOVUE-370) INJECTION 76%
INTRAVENOUS | Status: AC
  Administered 2017-02-19: 100 mL via INTRAVENOUS
  Filled 2017-02-19: qty 100

## 2017-02-19 MED ORDER — FENTANYL CITRATE (PF) 100 MCG/2ML IJ SOLN
50.0000 ug | INTRAMUSCULAR | Status: DC | PRN
  Administered 2017-02-19: 50 ug via INTRAVENOUS
  Filled 2017-02-19: qty 2

## 2017-02-19 MED ORDER — POTASSIUM CHLORIDE CRYS ER 20 MEQ PO TBCR: 20.0000 meq | EXTENDED_RELEASE_TABLET | Freq: Two times a day (BID) | ORAL | 0 refills | Status: DC

## 2017-02-19 NOTE — ED Notes (Signed)
Pt to xray

## 2017-02-19 NOTE — ED Triage Notes (Signed)
The pt is c/o lower back pain and sob when ever she returned from the pool just now  She stopped here on the way back from the pool  No sob at present  lmp 2-3 weeks ago no distress

## 2017-02-19 NOTE — ED Provider Notes (Signed)
[facility] DEPT Provider Note   CSN: [id number] Arrival date & time: 02/19/17  1536     History   Chief Complaint Chief Complaint  Patient presents with  . Back Pain  . Shortness of Breath    HPI Terri Chambers is a 41 y.o. female.  Patient with history of asthma, myasthenia gravis without needing treatment for 4 years, high blood pressure on hydrochlorothiazide, presents with right flank pain intermittent since this morning. No injuries. No history of kidney stone or urinary symptoms. No fevers or chills. Patient also had mild shortness of breath for the past 3-4 days no specific exacerbation factor. Patient denies blood clot risk factors. No cardiac history. Not worse with lying flat. No leg edema or weight gain. Patient noticed it while she was in the pool today.      Past Medical History:  Diagnosis Date  . Asthma   . Depression   . History of asthma   . Hypertension   . Migraine headache   . Myasthenia gravis   . Obesity     Patient Active Problem List   Diagnosis Date Noted  . Encounter for therapeutic drug monitoring 10/02/2012  . Myasthenia gravis without exacerbation ([place]) 10/02/2012  . Myasthenia gravis with exacerbation ([place]) 10/02/2012    Past Surgical History:  Procedure Laterality Date  . EAR TUBE REMOVAL      OB History    Gravida Para Term Preterm AB Living   1 1 1     1    SAB TAB Ectopic Multiple Live Births                   Home Medications    Prior to Admission medications   Medication Sig Start Date End Date Taking? Authorizing Provider  B Complex Vitamins (VITAMIN B COMPLEX PO) Take 1 tablet by mouth daily.   Yes [provider]  ketoconazole (NIZORAL) 2 % shampoo Apply 1 application topically daily. AS DIRECTED 01/26/17  Yes [provider]  Multiple Vitamins-Calcium (ONE-A-DAY WOMENS FORMULA) TABS Take 1 tablet by mouth daily.   Yes [provider]  Vitamin D, Ergocalciferol, (DRISDOL) 50000 units  CAPS capsule Take 50,000 Units by mouth every 7 (seven) days. 02/16/17  Yes [provider]  amLODipine (NORVASC) 10 MG tablet Take 1 tablet (10 mg total) by mouth daily. 02/19/17   Elnora Morrison, MD  ibuprofen (ADVIL,MOTRIN) 800 MG tablet Take 1 tablet (800 mg total) by mouth every 8 (eight) hours as needed. Patient not taking: Reported on 02/19/2017 12/12/16   Duanne Guess, PA-C  potassium chloride SA (K-DUR,KLOR-CON) 20 MEQ tablet Take 1 tablet (20 mEq total) by mouth 2 (two) times daily. 02/19/17   Elnora Morrison, MD    Family History Family History  Problem Relation Age of Onset  . Diabetes Mother   . Arthritis Father   . Hypertension Father   . Cerebral aneurysm Maternal Grandmother   . Congestive Heart Failure Paternal Grandmother   . Cervical cancer Other        Maternal great-grandmother died of cervical cancer  . Other Neg Hx     Social History Social History  Substance Use Topics  . Smoking status: Former Smoker    Quit date: 08/02/2008  . Smokeless tobacco: Never Used  . Alcohol use No     Allergies   Patient has no known allergies.   Review of Systems Review of Systems  Constitutional: Negative for chills and fever.  HENT: Negative for  congestion.   Eyes: Negative for visual disturbance.  Respiratory: Positive for shortness of breath.   Cardiovascular: Negative for chest pain.  Gastrointestinal: Negative for abdominal pain and vomiting.  Genitourinary: Positive for flank pain. Negative for dysuria.  Musculoskeletal: Positive for back pain. Negative for neck pain and neck stiffness.  Skin: Negative for rash.  Neurological: Negative for light-headedness and headaches.     Physical Exam Updated Vital Signs BP 93/80   Pulse 88   Temp 98.3 F (36.8 C)   Resp 20   Ht 5\' 8"  (1.727 m)   Wt 133.8 kg (295 lb)   LMP 02/05/2017   SpO2 97%   BMI 44.85 kg/m   Physical Exam  Constitutional: She is oriented to person, place, and time. She appears  well-developed and well-nourished.  HENT:  Head: Normocephalic and atraumatic.  Eyes: Conjunctivae are normal. Right eye exhibits no discharge. Left eye exhibits no discharge.  Neck: Normal range of motion. Neck supple. No tracheal deviation present.  Cardiovascular: Normal rate and regular rhythm.   Pulmonary/Chest: Effort normal and breath sounds normal.  Abdominal: Soft. She exhibits no distension. There is no tenderness. There is no guarding.  Musculoskeletal: She exhibits tenderness. She exhibits no edema.  Patient points to tenderness right lower lumbar upper buttock region. No induration or midline tenderness.  Neurological: She is alert and oriented to person, place, and time.  Skin: Skin is warm. No rash noted.  Psychiatric: She has a normal mood and affect.  Nursing note and vitals reviewed.    ED Treatments / Results  Labs (all labs ordered are listed, but only abnormal results are displayed) Labs Reviewed  COMPREHENSIVE METABOLIC PANEL - Abnormal; Notable for the following:       Result Value   Potassium 2.6 (*)    Glucose, Bld 148 (*)    All other components within normal limits  D-DIMER, QUANTITATIVE (NOT AT [facility]) - Abnormal; Notable for the following:    D-Dimer, Quant 1.29 (*)    All other components within normal limits  I-STAT CHEM 8, ED - Abnormal; Notable for the following:    Potassium 2.9 (*)    Chloride 97 (*)    Glucose, Bld 105 (*)    Calcium, Ion 1.13 (*)    Hemoglobin 11.9 (*)    HCT 35.0 (*)    All other components within normal limits  LIPASE, BLOOD  CBC  URINALYSIS, ROUTINE W REFLEX MICROSCOPIC  MAGNESIUM  TROPONIN I  POC URINE PREG, ED    EKG  EKG Interpretation  Date/Time:  Saturday February 19 2017 21:07:50 EDT Ventricular Rate:  85 PR Interval:  96 QRS Duration: 101 QT Interval:  397 QTC Calculation: 473 R Axis:   16 Text Interpretation:  Sinus rhythm Low voltage, precordial leads Borderline T abnormalities, diffuse leads Confirmed  by Elnora Morrison [phone]) on 02/19/2017 10:02:30 PM       Radiology Dg Chest 2 View  Result Date: 02/19/2017 CLINICAL DATA:  Shortness of breath for 4 days. EXAM: CHEST  2 VIEW COMPARISON:  09/07/2013 FINDINGS: The lungs are clear without focal pneumonia, edema, pneumothorax or pleural effusion. The cardiopericardial silhouette is within normal limits for size. The visualized bony structures of the thorax are intact. Telemetry leads overlie the chest. IMPRESSION: No active cardiopulmonary disease. Electronically Signed   By: Misty Stanley M.D.   On: 02/19/2017 17:29   Ct Angio Chest Pe W And/or Wo Contrast  Result Date: 02/19/2017 CLINICAL DATA:  Shortness of breath  for 4 days. EXAM: CT ANGIOGRAPHY CHEST WITH CONTRAST TECHNIQUE: Multidetector CT imaging of the chest was performed using the standard protocol during bolus administration of intravenous contrast. Multiplanar CT image reconstructions and MIPs were obtained to evaluate the vascular anatomy. CONTRAST:  100 cc Isovue 370 COMPARISON:  06/25/2015 FINDINGS: Cardiovascular: The heart is upper normal to mildly enlarged. No pericardial effusion.No filling defect in the opacified pulmonary arteries to suggest the presence of an acute pulmonary embolus. Mediastinum/Nodes: No mediastinal lymphadenopathy. There is no hilar lymphadenopathy. There is no axillary lymphadenopathy. The esophagus has normal imaging features. Lungs/Pleura: Assessment a lung parenchyma limited by motion artifact. There is patchy ground-glass attenuation in the lungs bilaterally which may simply reflect a component of under expansion/atelectasis although pulmonary edema or small airways disease can have this appearance. No dense focal airspace consolidation. No overt pulmonary edema or pleural effusion. Upper Abdomen: The liver shows diffusely decreased attenuation suggesting steatosis. Musculoskeletal: Bone windows reveal no worrisome lytic or sclerotic osseous lesions. Review of  the MIP images confirms the above findings. IMPRESSION: 1. No CT evidence for acute pulmonary embolus. 2. Scattered areas of patchy ground-glass attenuation, difficult to assess given motion degradation. Changes are most likely related to small airway disease, but pulmonary edema is possible. Electronically Signed   By: Misty Stanley M.D.   On: 02/19/2017 18:36    Procedures Procedures (including critical care time)  Medications Ordered in ED Medications  fentaNYL (SUBLIMAZE) injection 50 mcg (50 mcg Intravenous Given 02/19/17 1722)  potassium chloride 10 mEq in 100 mL IVPB (0 mEq Intravenous Stopped 02/19/17 1854)  potassium chloride 10 mEq in 100 mL IVPB (0 mEq Intravenous Stopped 02/19/17 2049)  potassium chloride SA (K-DUR,KLOR-CON) CR tablet 40 mEq (40 mEq Oral Given 02/19/17 1739)  iopamidol (ISOVUE-370) 76 % injection (100 mLs Intravenous Contrast Given 02/19/17 1801)  ketorolac (TORADOL) 15 MG/ML injection 15 mg (15 mg Intravenous Given 02/19/17 2110)  potassium chloride SA (K-DUR,KLOR-CON) CR tablet 40 mEq (40 mEq Oral Given 02/19/17 2205)     Initial Impression / Assessment and Plan / ED Course  I have reviewed the triage vital signs and the nursing notes.  Pertinent labs & imaging results that were available during my care of the patient were reviewed by me and considered in my medical decision making (see chart for details).    Patient presents with 2 separate concerns. Flank pain since earlier today concern possibly for musculoskeletal, urinalysis unremarkable no history of kidney stones. Pain mild. Patient also having new shortness of breath this week plan for cardiac screen. Low risk d-dimer positive CT Patience Musca ordered. Patient is on hydrocodone thiazide and not on potassium supplements potassium significantly low with prolonged QT. Plan for IV and oral potassium.  Patient given multiple IV and oral dose of potassium. Potassium improved and QT no longer elevated. Plan for  oral potassium supplementation and to stop chlorthalidone with close outpatient follow-up with primary care to direct blood pressure medications and potassium supplementation.  Results and differential diagnosis were discussed with the patient/parent/guardian. Xrays were independently reviewed by myself.  Close follow up outpatient was discussed, comfortable with the plan.   Medications  fentaNYL (SUBLIMAZE) injection 50 mcg (50 mcg Intravenous Given 02/19/17 1722)  potassium chloride 10 mEq in 100 mL IVPB (0 mEq Intravenous Stopped 02/19/17 1854)  potassium chloride 10 mEq in 100 mL IVPB (0 mEq Intravenous Stopped 02/19/17 2049)  potassium chloride SA (K-DUR,KLOR-CON) CR tablet 40 mEq (40 mEq Oral Given 02/19/17 1739)  iopamidol (  ISOVUE-370) 76 % injection (100 mLs Intravenous Contrast Given 02/19/17 1801)  ketorolac (TORADOL) 15 MG/ML injection 15 mg (15 mg Intravenous Given 02/19/17 2110)  potassium chloride SA (K-DUR,KLOR-CON) CR tablet 40 mEq (40 mEq Oral Given 02/19/17 2205)    Vitals:   02/19/17 2015 02/19/17 2030 02/19/17 2045 02/19/17 2203  BP: (!) 147/83 (!) 148/87 93/80   Pulse: 85 85 88   Resp: (!) 23 17 20    Temp:    98.3 F (36.8 C)  TempSrc:      SpO2: 98% 97% 97%   Weight:      Height:        Final diagnoses:  Hypokalemia  Dyspnea, unspecified type  Prolonged Q-T interval on ECG    Final Clinical Impressions(s) / ED Diagnoses   Final diagnoses:  Hypokalemia  Dyspnea, unspecified type  Prolonged Q-T interval on ECG    New Prescriptions New Prescriptions   AMLODIPINE (NORVASC) 10 MG TABLET    Take 1 tablet (10 mg total) by mouth daily.   POTASSIUM CHLORIDE SA (K-DUR,KLOR-CON) 20 MEQ TABLET    Take 1 tablet (20 mEq total) by mouth 2 (two) times daily.     Elnora Morrison, MD 02/19/17 2207

## 2017-02-19 NOTE — ED Notes (Signed)
Potassium 2.6 Dr. Reather Converse notified.

## 2017-02-19 NOTE — ED Triage Notes (Signed)
Some back pain since yesterday  No urinary symptoms

## 2017-02-19 NOTE — Discharge Instructions (Addendum)
Take potassium supplements as directed and follow-up closely with your primary doctor for medication review as well as potassium recheck. STOP taking chlorthalidone and review with your physician, start amlodipine for now.   If you were given medicines take as directed.  If you are on coumadin or contraceptives realize their levels and effectiveness is altered by many different medicines.  If you have any reaction (rash, tongues swelling, other) to the medicines stop taking and see a physician.    If your blood pressure was elevated in the ER make sure you follow up for management with a primary doctor or return for chest pain, shortness of breath or stroke symptoms.  Please follow up as directed and return to the ER or see a physician for new or worsening symptoms.  Thank you. Vitals:   02/19/17 1830 02/19/17 1845 02/19/17 1900 02/19/17 1915  BP: (!) 154/81 (!) 154/88 (!) 173/83 (!) 164/93  Pulse: 86 86 92 95  Resp: 16 (!) 22 (!) 25 18  Temp:      TempSrc:      SpO2: 98% 93% 95% 95%  Weight:      Height:

## 2017-02-19 NOTE — ED Notes (Signed)
Pt taken to CT.

## 2017-03-16 ENCOUNTER — Emergency Department: Payer: Medicaid Other

## 2017-03-16 ENCOUNTER — Encounter: Payer: Self-pay | Admitting: *Deleted

## 2017-03-16 ENCOUNTER — Emergency Department
Admission: EM | Admit: 2017-03-16 | Discharge: 2017-03-16 | Disposition: A | Discharge status: Home / Self Care | Payer: Medicaid Other | Attending: Student in an Organized Health Care Education/Training Program | Admitting: Student in an Organized Health Care Education/Training Program

## 2017-03-16 DIAGNOSIS — J45909 Unspecified asthma, uncomplicated: Secondary | ICD-10-CM | POA: Diagnosis not present

## 2017-03-16 DIAGNOSIS — M79671 Pain in right foot: Secondary | ICD-10-CM | POA: Diagnosis present

## 2017-03-16 DIAGNOSIS — I10 Essential (primary) hypertension: Secondary | ICD-10-CM | POA: Insufficient documentation

## 2017-03-16 DIAGNOSIS — M25532 Pain in left wrist: Secondary | ICD-10-CM | POA: Diagnosis not present

## 2017-03-16 DIAGNOSIS — R2241 Localized swelling, mass and lump, right lower limb: Secondary | ICD-10-CM | POA: Diagnosis not present

## 2017-03-16 DIAGNOSIS — Z79899 Other long term (current) drug therapy: Secondary | ICD-10-CM | POA: Diagnosis not present

## 2017-03-16 DIAGNOSIS — Z87891 Personal history of nicotine dependence: Secondary | ICD-10-CM | POA: Insufficient documentation

## 2017-03-16 MED ORDER — NAPROXEN 500 MG PO TABS: 500.0000 mg | ORAL_TABLET | Freq: Two times a day (BID) | ORAL | 0 refills | Status: DC
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
## 2017-03-16 NOTE — Discharge Instructions (Signed)
Follow-up with your primary care doctor if any continued problems. Begin taking naproxen 500 mg twice a day with food. Ice to your wrist as needed for inflammation or pain. Wear supportive shoes with walking. Elevate foot if needed for swelling.
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
## 2017-05-12 ENCOUNTER — Other Ambulatory Visit: Payer: Self-pay | Admitting: Ophthalmology

## 2017-05-12 DIAGNOSIS — H532 Diplopia: Secondary | ICD-10-CM

## 2017-05-12 DIAGNOSIS — G7000 Myasthenia gravis without (acute) exacerbation: Secondary | ICD-10-CM

## 2017-05-24 ENCOUNTER — Ambulatory Visit
Admission: RE | Admit: 2017-05-24 | Discharge: 2017-05-24 | Disposition: A | Discharge status: Home / Self Care | Payer: Medicaid Other | Source: Ambulatory Visit | Attending: Ophthalmology | Admitting: Ophthalmology

## 2017-05-24 DIAGNOSIS — H532 Diplopia: Secondary | ICD-10-CM | POA: Diagnosis present

## 2017-05-24 DIAGNOSIS — G7000 Myasthenia gravis without (acute) exacerbation: Secondary | ICD-10-CM | POA: Insufficient documentation

## 2017-05-24 LAB — POCT I-STAT CREATININE: Creatinine, Ser: 0.7 mg/dL (ref 0.44–1.00)

## 2017-05-31 ENCOUNTER — Ambulatory Visit: Payer: Medicaid Other

## 2017-09-20 ENCOUNTER — Emergency Department
Admission: EM | Admit: 2017-09-20 | Discharge: 2017-09-20 | Disposition: A | Discharge status: Home / Self Care | Payer: Medicaid Other | Attending: Emergency Medicine | Admitting: Emergency Medicine

## 2017-09-20 ENCOUNTER — Other Ambulatory Visit: Payer: Self-pay

## 2017-09-20 ENCOUNTER — Emergency Department: Payer: Medicaid Other

## 2017-09-20 ENCOUNTER — Encounter: Payer: Self-pay | Admitting: Emergency Medicine

## 2017-09-20 DIAGNOSIS — N949 Unspecified condition associated with female genital organs and menstrual cycle: Secondary | ICD-10-CM | POA: Diagnosis not present

## 2017-09-20 DIAGNOSIS — Z79899 Other long term (current) drug therapy: Secondary | ICD-10-CM | POA: Diagnosis not present

## 2017-09-20 DIAGNOSIS — K429 Umbilical hernia without obstruction or gangrene: Secondary | ICD-10-CM | POA: Diagnosis not present

## 2017-09-20 DIAGNOSIS — Z87891 Personal history of nicotine dependence: Secondary | ICD-10-CM | POA: Diagnosis not present

## 2017-09-20 DIAGNOSIS — J45909 Unspecified asthma, uncomplicated: Secondary | ICD-10-CM | POA: Diagnosis not present

## 2017-09-20 DIAGNOSIS — I10 Essential (primary) hypertension: Secondary | ICD-10-CM | POA: Diagnosis not present

## 2017-09-20 DIAGNOSIS — R1033 Periumbilical pain: Secondary | ICD-10-CM

## 2017-09-20 DIAGNOSIS — N9489 Other specified conditions associated with female genital organs and menstrual cycle: Secondary | ICD-10-CM

## 2017-09-20 LAB — COMPREHENSIVE METABOLIC PANEL
ALK PHOS: 61 U/L (ref 38–126)
ALT: 18 U/L (ref 14–54)
ANION GAP: 11 (ref 5–15)
AST: 23 U/L (ref 15–41)
Albumin: 4 g/dL (ref 3.5–5.0)
BUN: 8 mg/dL (ref 6–20)
CALCIUM: 9 mg/dL (ref 8.9–10.3)
CO2: 23 mmol/L (ref 22–32)
CREATININE: 0.55 mg/dL (ref 0.44–1.00)
Chloride: 104 mmol/L (ref 101–111)
Glucose, Bld: 106 mg/dL — ABNORMAL HIGH (ref 65–99)
Potassium: 3.3 mmol/L — ABNORMAL LOW (ref 3.5–5.1)
SODIUM: 138 mmol/L (ref 135–145)
Total Bilirubin: 0.4 mg/dL (ref 0.3–1.2)
Total Protein: 7.8 g/dL (ref 6.5–8.1)

## 2017-09-20 LAB — URINALYSIS, COMPLETE (UACMP) WITH MICROSCOPIC
BILIRUBIN URINE: NEGATIVE
Bacteria, UA: NONE SEEN
GLUCOSE, UA: NEGATIVE mg/dL
HGB URINE DIPSTICK: NEGATIVE
KETONES UR: NEGATIVE mg/dL
NITRITE: NEGATIVE
Protein, ur: NEGATIVE mg/dL
Specific Gravity, Urine: 1.012 (ref 1.005–1.030)
pH: 6 (ref 5.0–8.0)

## 2017-09-20 LAB — CBC
HCT: 40 % (ref 35.0–47.0)
HEMOGLOBIN: 13.2 g/dL (ref 12.0–16.0)
MCH: 28.8 pg (ref 26.0–34.0)
MCHC: 33 g/dL (ref 32.0–36.0)
MCV: 87.1 fL (ref 80.0–100.0)
Platelets: 314 10*3/uL (ref 150–440)
RBC: 4.59 MIL/uL (ref 3.80–5.20)
RDW: 14.2 % (ref 11.5–14.5)
WBC: 7 10*3/uL (ref 3.6–11.0)

## 2017-09-20 LAB — LIPASE, BLOOD: Lipase: 27 U/L (ref 11–51)

## 2017-09-20 LAB — POCT PREGNANCY, URINE: Preg Test, Ur: NEGATIVE

## 2017-09-20 MED ORDER — HYDROCODONE-ACETAMINOPHEN 5-325 MG PO TABS: 1.0000 | ORAL_TABLET | ORAL | 0 refills | Status: DC | PRN

## 2017-09-20 MED ORDER — KETOROLAC TROMETHAMINE 60 MG/2ML IM SOLN
60.0000 mg | Freq: Once | INTRAMUSCULAR | Status: AC
  Administered 2017-09-20: 60 mg via INTRAMUSCULAR
  Filled 2017-09-20: qty 2

## 2017-09-20 NOTE — ED Triage Notes (Signed)
Patient ambulatory to triage with steady gait, without difficulty or distress noted; pt reports since yesterday having mid lower abd pain (umbilical area) accomp by frontal HA

## 2017-09-20 NOTE — Discharge Instructions (Signed)
As we discussed your workup today is most consistent with a hernia containing abdominal fat around her bellybutton.  Please call surgery at the number provided to arrange a follow-up appointment to discuss further treatment.  Please take your pain medication as needed but only as written.  Do not drink alcohol or drive while taking this medication.  Return to the emergency department if you begin vomiting develop worsening abdominal pain or fever.  As we discussed her CT also showed a right sided ovary mass which is likely a dermoid cyst.  Please call the number provided for OB/GYN to arrange a follow-up appointment as soon as possible.

## 2017-09-20 NOTE — ED Provider Notes (Signed)
[facility] Emergency Department Provider Note  Time seen: 9:56 PM  I have reviewed the triage vital signs and the nursing notes.   HISTORY  Chief Complaint Abdominal Pain    HPI Terri Chambers is a 42 y.o. female with a past medical history of asthma, hypertension, migraines, obesity presents to the emergency department for abdominal pain.  According to the patient for the past several days she has been experiencing abdominal pain around her umbilicus.  States the pain is much worse if she coughs sneezes or bends over.  Denies any fever.  Denies nausea vomiting or diarrhea.  States that normal bowel movement yesterday.  Denies dysuria or hematuria.  Describes her pain is moderate 6/10 currently.   Past Medical History:  Diagnosis Date  . Asthma   . Depression   . History of asthma   . Hypertension   . Migraine headache   . Myasthenia gravis   . Obesity     Patient Active Problem List   Diagnosis Date Noted  . Encounter for therapeutic drug monitoring 10/02/2012  . Myasthenia gravis without exacerbation ([place]) 10/02/2012  . Myasthenia gravis with exacerbation ([place]) 10/02/2012    Past Surgical History:  Procedure Laterality Date  . EAR TUBE REMOVAL      Prior to Admission medications   Medication Sig Start Date End Date Taking? Authorizing Provider  amLODipine (NORVASC) 10 MG tablet Take 1 tablet (10 mg total) by mouth daily. 02/19/17   Elnora Morrison, MD  B Complex Vitamins (VITAMIN B COMPLEX PO) Take 1 tablet by mouth daily.    [provider]  ketoconazole (NIZORAL) 2 % shampoo Apply 1 application topically daily. AS DIRECTED 01/26/17   [provider]  Multiple Vitamins-Calcium (ONE-A-DAY WOMENS FORMULA) TABS Take 1 tablet by mouth daily.    [provider]  naproxen (NAPROSYN) 500 MG tablet Take 1 tablet (500 mg total) by mouth 2 (two) times daily with a meal. 03/16/17   Letitia Neri L, PA-C  potassium chloride SA  (K-DUR,KLOR-CON) 20 MEQ tablet Take 1 tablet (20 mEq total) by mouth 2 (two) times daily. 02/19/17   Elnora Morrison, MD  Vitamin D, Ergocalciferol, (DRISDOL) 50000 units CAPS capsule Take 50,000 Units by mouth every 7 (seven) days. 02/16/17   [provider]    No Known Allergies  Family History  Problem Relation Age of Onset  . Diabetes Mother   . Arthritis Father   . Hypertension Father   . Cerebral aneurysm Maternal Grandmother   . Congestive Heart Failure Paternal Grandmother   . Cervical cancer Other        Maternal great-grandmother died of cervical cancer  . Other Neg Hx     Social History Social History   Tobacco Use  . Smoking status: Former Smoker    Last attempt to quit: 08/02/2008    Years since quitting: 9.1  . Smokeless tobacco: Never Used  Substance Use Topics  . Alcohol use: No  . Drug use: No    Review of Systems Constitutional: Negative for fever. Eyes: Negative for visual complaints ENT: Negative for recent illness/congestion Cardiovascular: Negative for chest pain. Respiratory: Negative for shortness of breath. Gastrointestinal: Periumbilical abdominal pain, moderate.  Negative for nausea vomiting or diarrhea Genitourinary: Negative for urinary compaints Musculoskeletal: Negative for musculoskeletal complaints Skin: Negative for skin complaints  Neurological: Moderate headache All other ROS negative  ____________________________________________   PHYSICAL EXAM:  VITAL SIGNS: ED Triage Vitals [09/20/17 2020]  Enc Vitals Group  BP (!) 192/120     Pulse Rate 94     Resp 20     Temp 98.5 F (36.9 C)     Temp Source Oral     SpO2 99 %     Weight 285 lb (129.3 kg)     Height 5\' 8"  (1.727 m)     Head Circumference      Peak Flow      Pain Score 6     Pain Loc      Pain Edu?      Excl. in [place]?    Constitutional: Alert and oriented. Well appearing and in no distress. Eyes: Normal exam ENT   Head: Normocephalic and  atraumatic.   Mouth/Throat: Mucous membranes are moist. Cardiovascular: Normal rate, regular rhythm. No murmur Respiratory: Normal respiratory effort without tachypnea nor retractions. Breath sounds are clear  Gastrointestinal: Soft, moderate tenderness to palpation around the umbilicus with no obvious mass palpated but difficult exam due to habitus.  Normal bowel sounds. Musculoskeletal: Nontender with normal range of motion in all extremities.  Neurologic:  Normal speech and language. No gross focal neurologic deficits Skin:  Skin is warm, dry and intact.  Psychiatric: Mood and affect are normal.   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  Patient has no right lower quadrant tenderness to palpation.  Highly suspect the patient's pain is coming from the umbilical fat-containing hernia.  ____________________________________________   INITIAL IMPRESSION / ASSESSMENT AND PLAN / ED COURSE  Pertinent labs & imaging results that were available during my care of the patient were reviewed by me and considered in my medical decision making (see chart for details).  Patient presents emergency department for periumbilical abdominal pain for the past 2-3 days.  Differential would include umbilical hernia, incarcerated or strangulated hernia, abdominal wall pain, other intra-abdominal pathology such as appendicitis colitis diverticulitis.  Reassuringly patient's blood work is largely within normal limits including a normal white blood cell count.  Patient is only tender just superior to the umbilicus, no other areas of tenderness within the abdomen.  We will proceed with an abdominal CT to further evaluate and help rule out hernia.  Overall the patient appears well.  Does not wish for any narcotic pain medication but is agreeable to Toradol.  Upon arrival patient's  blood pressure quite elevated 190/120.  Patient states she had a doctor appointment about a week ago and her blood pressure was 160/80 at that time patient has been taking her blood pressure medications and has not missed any doses and is following up with her doctor regarding her blood pressure. Vocal fat-containing hernia likely the cause of the patient's discomfort.  However CT also shows a six-point centimeter likely dermoid mass with possible fibroid versus solid tumor into the right adnexa.  Patient has no tenderness in the right lower quadrant.  I discussed this finding with Dr. Marcelline Mates of OB/GYN, recommends outpatient follow-up with her in the office.  We will have the patient follow-up for further evaluation.  We will dose pain medication also have the patient follow-up general surgery for further evaluation of the umbilical hernia.  ____________________________________________   FINAL CLINICAL IMPRESSION(S) / ED DIAGNOSES  Abdominal pain Fat-containing umbilical hernia. Adnexal mass.   Harvest Dark, MD 09/20/17 2302

## 2017-10-04 ENCOUNTER — Ambulatory Visit (INDEPENDENT_AMBULATORY_CARE_PROVIDER_SITE_OTHER): Payer: Medicaid Other | Admitting: Obstetrics and Gynecology

## 2017-10-04 ENCOUNTER — Encounter: Payer: Self-pay | Admitting: Obstetrics and Gynecology

## 2017-10-04 VITALS — BP 131/85 | HR 90 | Ht 68.0 in | Wt 294.7 lb

## 2017-10-04 DIAGNOSIS — N920 Excessive and frequent menstruation with regular cycle: Secondary | ICD-10-CM | POA: Diagnosis not present

## 2017-10-04 DIAGNOSIS — I10 Essential (primary) hypertension: Secondary | ICD-10-CM

## 2017-10-04 DIAGNOSIS — G43009 Migraine without aura, not intractable, without status migrainosus: Secondary | ICD-10-CM

## 2017-10-04 DIAGNOSIS — K429 Umbilical hernia without obstruction or gangrene: Secondary | ICD-10-CM | POA: Diagnosis not present

## 2017-10-04 DIAGNOSIS — D259 Leiomyoma of uterus, unspecified: Secondary | ICD-10-CM

## 2017-10-04 DIAGNOSIS — N83201 Unspecified ovarian cyst, right side: Secondary | ICD-10-CM

## 2017-10-04 MED ORDER — NORETHINDRONE ACET-ETHINYL EST 1.5-30 MG-MCG PO TABS: 1.0000 | ORAL_TABLET | Freq: Every day | ORAL | 0 refills | Status: DC

## 2017-10-04 NOTE — Progress Notes (Signed)
GYNECOLOGY PROGRESS NOTE  Subjective:    Patient ID: Terri Chambers, female    DOB: 1975/11/27, 42 y.o.   MRN: [id number]  HPI  Patient is a 42 y.o. G54P1001 female with a PMH of asthma, HTN, migraines, morbid obesity, and myasthenia gravis who presents for f/u from Emergency Room (09/20/2017) for newly diagnosed pelvic mass (right ovarian cyst), fibroid uterus, and also incidental findings of umbilical hernia. She initially presented to the ER due to complaints of abdominal pain for several days. She reports also that her menstrual cycle has changed over the last 5 months, becoming heavier and longer (usually lasts 3 days, now lasting 5-6 days, with heaviest day being on first day of cycle, now changing pads q 2-3 hours when she used to go 4-5 hours before changing a pad).  Patient denies knowledge of fibroid uterus prior to ER visit.   Of note, patient receives GYN care from [facility].  Notes h/o normal pap smears. Last pap smear was 1 year ago.    Past Medical History:  Diagnosis Date  . Asthma   . Depression   . Hypertension   . Migraine headache   . Myasthenia gravis   . Obesity     Family History  Problem Relation Age of Onset  . Diabetes Mother   . Arthritis Father   . Hypertension Father   . Cerebral aneurysm Maternal Grandmother   . Congestive Heart Failure Paternal Grandmother   . Cervical cancer Other        Maternal great-grandmother died of cervical cancer  . Other Neg Hx     Past Surgical History:  Procedure Laterality Date  . EAR TUBE REMOVAL      Social History   Socioeconomic History  . Marital status: Divorced    Spouse name: Not on file  . Number of children: 1  . Years of education: Not on file  . Highest education level: Not on file  Social Needs  . Financial resource strain: Not on file  . Food insecurity - worry: Not on file  . Food insecurity - inability: Not on file  . Transportation needs - medical: Not on file  .  Transportation needs - non-medical: Not on file  Occupational History  . Occupation: Unempolyed    Employer: [employer]  Tobacco Use  . Smoking status: Former Smoker    Last attempt to quit: 08/02/2008    Years since quitting: 9.1  . Smokeless tobacco: Never Used  Substance and Sexual Activity  . Alcohol use: No  . Drug use: No  . Sexual activity: Not on file  Other Topics Concern  . Not on file  Social History Narrative  . Not on file    Current Outpatient Medications on File Prior to Visit  Medication Sig Dispense Refill  . HYDROcodone-acetaminophen (NORCO/VICODIN) 5-325 MG tablet Take 1 tablet by mouth every 4 (four) hours as needed. 15 tablet 0  . ketoconazole (NIZORAL) 2 % shampoo Apply 1 application topically daily. AS DIRECTED    . lisinopril (PRINIVIL,ZESTRIL) 20 MG tablet Take 20 mg by mouth daily.     No current facility-administered medications on file prior to visit.     No Known Allergies    Review of Systems A comprehensive review of systems was negative except for: what's noted in HPI and Genitourinary: positive for frequency (at night).  Notes that she normally sleeps during the night, but over the past few months has had to wake  up at least 3-4 times per night to void.   Objective:   Blood pressure 131/85, pulse 90, height 5\' 8"  (1.727 m), weight 294 lb 11.2 oz (133.7 kg), last menstrual period 10/01/2017. Body mass index is 44.81 kg/m.  General appearance: alert and no distress, morbidly obese Abdomen: normal findings: bowel sounds normal, no organomegaly and soft and abnormal findings:  mass, located arising from the pelvis, approximately 4 cm beneath umbilicus, mild tenderness in the left mid to lower abdomen and umbilical hernia Pelvic: external genitalia normal, rectovaginal septum normal.  Vagina with small amount of dark red blood, no discharge.  Cervix normal appearing, anteriorly located, no lesions and no motion tenderness.  Uterus mobile, nontender,  normal shape and size.  Adnexae non-palpable, nontender bilaterally.  Extremities: extremities normal, atraumatic, no cyanosis or edema Neurologic: Grossly normal    Labs:  Results for orders placed or performed during the hospital encounter of 09/20/17  Lipase, blood  Result Value Ref Range   Lipase 27 11 - 51 U/L  Comprehensive metabolic panel  Result Value Ref Range   Sodium 138 135 - 145 mmol/L   Potassium 3.3 (L) 3.5 - 5.1 mmol/L   Chloride 104 101 - 111 mmol/L   CO2 23 22 - 32 mmol/L   Glucose, Bld 106 (H) 65 - 99 mg/dL   BUN 8 6 - 20 mg/dL   Creatinine, Ser 0.55 0.44 - 1.00 mg/dL   Calcium 9.0 8.9 - 10.3 mg/dL   Total Protein 7.8 6.5 - 8.1 g/dL   Albumin 4.0 3.5 - 5.0 g/dL   AST 23 15 - 41 U/L   ALT 18 14 - 54 U/L   Alkaline Phosphatase 61 38 - 126 U/L   Total Bilirubin 0.4 0.3 - 1.2 mg/dL   GFR calc non Af Amer >60 >60 mL/min   GFR calc Af Amer >60 >60 mL/min   Anion gap 11 5 - 15  CBC  Result Value Ref Range   WBC 7.0 3.6 - 11.0 K/uL   RBC 4.59 3.80 - 5.20 MIL/uL   Hemoglobin 13.2 12.0 - 16.0 g/dL   HCT 40.0 35.0 - 47.0 %   MCV 87.1 80.0 - 100.0 fL   MCH 28.8 26.0 - 34.0 pg   MCHC 33.0 32.0 - 36.0 g/dL   RDW 14.2 11.5 - 14.5 %   Platelets 314 150 - 440 K/uL  Urinalysis, Complete w Microscopic  Result Value Ref Range   Color, Urine YELLOW (A) YELLOW   APPearance CLOUDY (A) CLEAR   Specific Gravity, Urine 1.012 1.005 - 1.030   pH 6.0 5.0 - 8.0   Glucose, UA NEGATIVE NEGATIVE mg/dL   Hgb urine dipstick NEGATIVE NEGATIVE   Bilirubin Urine NEGATIVE NEGATIVE   Ketones, ur NEGATIVE NEGATIVE mg/dL   Protein, ur NEGATIVE NEGATIVE mg/dL   Nitrite NEGATIVE NEGATIVE   Leukocytes, UA TRACE (A) NEGATIVE   RBC / HPF 0-5 0 - 5 RBC/hpf   WBC, UA 0-5 0 - 5 WBC/hpf   Bacteria, UA NONE SEEN NONE SEEN   Squamous Epithelial / LPF 6-30 (A) NONE SEEN   Mucus PRESENT   Pregnancy, urine POC  Result Value Ref Range   Preg Test, Ur NEGATIVE NEGATIVE     Imaging:    CLINICAL DATA:  Abdominal pain.  EXAM: CT ABDOMEN AND PELVIS WITHOUT CONTRAST  TECHNIQUE: Multidetector CT imaging of the abdomen and pelvis was performed following the standard protocol without IV contrast.  COMPARISON:  None.  FINDINGS: Lower  chest: Minimal atelectasis. No consolidation or pleural fluid.  Hepatobiliary: The liver is enlarged spanning 21 cm cranial caudal. No focal lesion allowing for lack contrast. Gallbladder is contracted without calcified gallstone. No biliary dilatation.  Pancreas: No ductal dilatation or inflammation.  Spleen: Normal in size without focal abnormality.  Adrenals/Urinary Tract: Normal adrenal glands. No hydronephrosis or perinephric edema. No urolithiasis. Both ureters are decompressed without stones along the course. Urinary bladder is partially distended without stone or wall thickening.  Stomach/Bowel: Stomach distended with ingested contents. No bowel dilatation, inflammation or obstruction. Normal appendix draped over right adnexal dermoid. Mild diverticulosis of the descending and sigmoid colon without diverticulitis.  Vascular/Lymphatic: Normal caliber abdominal aorta. No bulky adenopathy. Small right lower quadrant nodes measure up to 7 mm. Bilateral external iliac nodes all subcentimeter.  Reproductive: There is a 6.7 x 6.0 cm round mass in the high right adnexa containing fat, calcification, and soft tissue component. Inferiorly this is adjacent to rounded homogeneous density that may be an exophytic fibroid versus ovarian. The uterus is enlarged with lobular contours suggesting underlying fibroids. The uterus spans 16.7 cm in craniocaudal dimension. Normal left ovary tentatively identified.  Other: Moderate fat containing umbilical hernia. No free air free fluid.  Musculoskeletal: There are no acute or suspicious osseous abnormalities.  IMPRESSION: 1. Right high adnexal mass measuring 6.7 cm  consistent with dermoid. This is adjacent to a rounded soft tissue density in the adnexa, indeterminate for exophytic fibroid versus solid ovarian lesion. The uterus is enlarged with lobular contours consistent with underlying fibroids. Recommend GYN consultation. Pelvic ultrasound could be considered for further evaluation, however may be of limited value given location high in the pelvis and patient body habitus. 2. Colonic diverticulosis without diverticulitis. 3. Hepatomegaly. 4. Fat containing umbilical hernia.    Assessment:   Fibroid uterus Pressure symptoms Menorrhagia with regular cycle Right adnexal mass (dermoid) Umbilical hernia H/o migraines H/o HTN Morbid obesity  Plan:   1. Enlarged fibroid uterus with pressure symptoms - patient with newly diagnosed fibroids.  Likely causing pressure symptoms due to uterine enlargement (currently ~ 16 cm) and can also be the cause of her menorrhagia.  Will need pelvic ultrasound to further delineate fibroids as this can better help to determine management approach.  Discussed options for management including hormonal methods (Depo Provera, Depot Lupron), and surgical methods (myomectomy, UFE, hysterectomy).  Patient declines definitive management with hysterectomy.  Does not desire future childbearing but notes she probably would not want to have a major surgery with myomectomy).  Will consider hormonal method vs UFE.  Info given on both.  2. Menorrhagia with regular cycles - cycles have become heavier and longer over the past 5 months. Can be due to hormonal dysregulation, or fibroid uterus, or morbid obesity (causing hyper-estrogenism).  Currently no signs of anemia.  Discussed temporary management options of Lysteda, contraception options, and surgical management as noted above.  Patient with h/o migraines (without aura) and well controlled HTN. Patient notes she will take temporarily take OCPs until decision made. Will prescribe. If  surgical intervention desired, will need further workup with labs (TSH), and endometrial biopsy first.  3. Right adnexal mass, appears to be a dermoid cyst, 6 cm.  Discussed that if symptomatic, this would require surgical removal.  Otherwise can continue to monitor and manage expectantly until size reaches 8-10 cm.  Patient desires to think about surgery as she states she has never had surgery before.  If surgical intervention is desired, will also have  umbilical hernia repaired.  4. Umbilical hernia - mildly symptomatic.  Hernia is reducible, non-strangulated.  If patient decides on surgical management for cyst, will also have hernia repaired. Will need referral to General Surgery to coordinate joint surgery.   5. F/u in 4 weeks for ultrasound and f/u with MD.    A total of 45 minutes were spent face-to-face with the patient during the encounter with greater than 50% dealing with counseling and coordination of care.  Rubie Maid, MD [facility]

## 2017-10-04 NOTE — Progress Notes (Signed)
Pt stated that periods have changed in the last 5 months they are heavier and longer.

## 2017-10-11 ENCOUNTER — Telehealth: Payer: Self-pay | Admitting: General Practice

## 2017-10-11 NOTE — Telephone Encounter (Signed)
Called and spoke with the patient, patient said she went and saw Dr. Marcelline Mates and was given a medication to see if the cyst would go down, patient was told to take the medication for six weeks and if no change to make an appointment with a surgeon.

## 2017-11-01 ENCOUNTER — Ambulatory Visit (INDEPENDENT_AMBULATORY_CARE_PROVIDER_SITE_OTHER): Payer: Medicaid Other | Admitting: Obstetrics and Gynecology

## 2017-11-01 ENCOUNTER — Encounter: Payer: Self-pay | Admitting: Obstetrics and Gynecology

## 2017-11-01 ENCOUNTER — Ambulatory Visit (INDEPENDENT_AMBULATORY_CARE_PROVIDER_SITE_OTHER): Payer: Medicaid Other

## 2017-11-01 VITALS — BP 166/113 | HR 84 | Ht 68.0 in | Wt 294.3 lb

## 2017-11-01 DIAGNOSIS — N83201 Unspecified ovarian cyst, right side: Secondary | ICD-10-CM

## 2017-11-01 DIAGNOSIS — K429 Umbilical hernia without obstruction or gangrene: Secondary | ICD-10-CM | POA: Diagnosis not present

## 2017-11-01 DIAGNOSIS — D259 Leiomyoma of uterus, unspecified: Secondary | ICD-10-CM

## 2017-11-01 DIAGNOSIS — N920 Excessive and frequent menstruation with regular cycle: Secondary | ICD-10-CM

## 2017-11-01 DIAGNOSIS — Z8709 Personal history of other diseases of the respiratory system: Secondary | ICD-10-CM

## 2017-11-01 DIAGNOSIS — G7000 Myasthenia gravis without (acute) exacerbation: Secondary | ICD-10-CM

## 2017-11-01 DIAGNOSIS — I10 Essential (primary) hypertension: Secondary | ICD-10-CM | POA: Diagnosis not present

## 2017-11-01 NOTE — Progress Notes (Signed)
Pt wants to discuss about getting surgery to have fibroids removed.

## 2017-11-01 NOTE — Progress Notes (Signed)
GYNECOLOGY PROGRESS NOTE  Subjective:    Patient ID: Terri Chambers, female    DOB: 09/23/1975, 42 y.o.   MRN: [id number]  HPI  Patient is a 42 y.o. G52P1001 female who presents for with a PMH of asthma, HTN, migraines, morbid obesity, and myasthenia gravis who presents for follow-up discussion of management of newly diagnosed enlarged fibroid uterus, menorrhagia,  and right ovarian cyst.  She also has an incidental finding of an umbilical hernia. As patient still desires fertility, she would like to revisit discussion of possibly having a myomectomy.   The following portions of the patient's history were reviewed and updated as appropriate: allergies, current medications, past family history, past medical history, past social history, past surgical history and problem list.  Review of Systems Pertinent items noted in HPI and remainder of comprehensive ROS otherwise negative.   Objective:   Blood pressure (!) 166/113, pulse 84, height 5\' 8"  (1.727 m), weight 294 lb 4.8 oz (133.5 kg), last menstrual period 10/08/2017. Body mass index is 44.75 kg/m.  General appearance: alert and no distress Remainder of exam deferred.    Imaging:   CLINICAL DATA:  Abdominal pain.  EXAM: CT ABDOMEN AND PELVIS WITHOUT CONTRAST (performed 09/20/2017).   TECHNIQUE: Multidetector CT imaging of the abdomen and pelvis was performed following the standard protocol without IV contrast.  COMPARISON:  None.  FINDINGS: Lower chest: Minimal atelectasis. No consolidation or pleural fluid.  Hepatobiliary: The liver is enlarged spanning 21 cm cranial caudal. No focal lesion allowing for lack contrast. Gallbladder is contracted without calcified gallstone. No biliary dilatation.  Pancreas: No ductal dilatation or inflammation.  Spleen: Normal in size without focal abnormality.  Adrenals/Urinary Tract: Normal adrenal glands. No hydronephrosis or perinephric edema. No urolithiasis. Both ureters  are decompressed without stones along the course. Urinary bladder is partially distended without stone or wall thickening.  Stomach/Bowel: Stomach distended with ingested contents. No bowel dilatation, inflammation or obstruction. Normal appendix draped over right adnexal dermoid. Mild diverticulosis of the descending and sigmoid colon without diverticulitis.  Vascular/Lymphatic: Normal caliber abdominal aorta. No bulky adenopathy. Small right lower quadrant nodes measure up to 7 mm. Bilateral external iliac nodes all subcentimeter.  Reproductive: There is a 6.7 x 6.0 cm round mass in the high right adnexa containing fat, calcification, and soft tissue component. Inferiorly this is adjacent to rounded homogeneous density that may be an exophytic fibroid versus ovarian. The uterus is enlarged with lobular contours suggesting underlying fibroids. The uterus spans 16.7 cm in craniocaudal dimension. Normal left ovary tentatively identified.  Other: Moderate fat containing umbilical hernia. No free air free fluid.  Musculoskeletal: There are no acute or suspicious osseous abnormalities.  IMPRESSION: 1. Right high adnexal mass measuring 6.7 cm consistent with dermoid. This is adjacent to a rounded soft tissue density in the adnexa, indeterminate for exophytic fibroid versus solid ovarian lesion. The uterus is enlarged with lobular contours consistent with underlying fibroids. Recommend GYN consultation. Pelvic ultrasound could be considered for further evaluation, however may be of limited value given location high in the pelvis and patient body habitus. 2. Colonic diverticulosis without diverticulitis. 3. Hepatomegaly. 4. Fat containing umbilical hernia.   Electronically Signed   By: Jeb Levering M.D.   On: 09/20/2017 22:32   ULTRASOUND REPORT  Location: [facility] Date of Service:  11/01/2017   Indications: Fibroids; Rt Ov Cyst Findings:  The  uterus is grossly enlarged and measures approximately 26.3 x 8.8 x 9.0 cm.  It  extends from the pelvis to the umbilicus area. Echo texture is heterogeneous with evidence of focal masses. The uterus appears to contain multiple fibroids, but has no discernable borders to measure. The Endometrium is unable to be identified due to the presence of fibroids.  Right Ovary measures 7.2 x 6.7 x 6.2 cm and contains a simple cyst measuring 4.4 x 4.4 x 4.2 cm.  Left Ovary measures 3.4 x 2.2 x 2.4 cm. It is normal appearance. There is no free fluid in the cul de sac.  There is a solid mass measuring 6.1 x 8.0 x 6.3 cm in the right adnexa, however, it is difficult to differentiate if this is arising from the right ovary or if it is part of the fibroid uterus.  Impression: 1. Enlarged fibroid uterus measuring approx. 26.3 x 8.8 x 9.0 cm. 2. Enlarged right ovary measuring 7.2 x 6.7 x 6.2 cm with a simple cyst measuring 4.4 x 4.4 x 4.2 cm. 3. Right adnexal solid mass measuring 6.1 x 8.0 x 6.3 cm.  This is unable to be determined if arising from the ovary or uterus.  Recommendations: 1.Clinical correlation with the patient's History and Physical Exam.   Dario Ave, RDMS   Assessment:   Enlarged fibroid uterus Menorrhagia  Right adnexal cyst Multiple comorbidities (Obesity, Asthma, HTN, mysasthenia gravis)  Plan:   1. Enlarged fibroid uterus - lengthy discussion had with patient regarding management options. At this point due to to very large size of fibroid uterus (26 cm), recommendation is for hysterectomy.  Patient very hesitant as she states she was trying to preserve her fertility.  Currently is not in a relationship and is not sexually active. Discussed that if myomectomy was truly desired, then I could refer her to a tertiary care facility where she may be able to have the procedure performed. Discussed risks of the procedure, including hemorrhage, and not having enough uterine tissue to  preserve once fibroids were removed, and possibly requiring a hysterectomy at time of surgery at that time. After discussion, patient now more willing to proceed with hysterectomy.  Due to the size of the fibroids, I recommend 3-6 months of Lupron to help shrink the fibroids and to manage her menorrhagia prior to surgical intervention.  Given information to review.  Patient ok to proceed with management. Will notify once Lupron arrives.  2. Menorrhagia - see above (#1). Will manage with Lupron until hysterectomy.  3. Right adnexal cyst - simple cyst, ~ 6 cm.  Not bothersome to patient currently. If still present at time of surgery, will remove.  4. Multiple comorbidities, including obesity, asthma, HTN (uncontrolled currently), and myasthenia gravis. Will ensure that all are properly managed prior to surgical intervention.  5. Umbilical hernia - identified on CT scan at last ER visit.  Asymptomatic, but patient notes she would desire repair at time of other surgical intervention.  Will refer to General Surgeon closer to the time of scheduled hysterectomy.   A total of 25 minutes were spent face-to-face with the patient during this encounter and over half of that time involved counseling and coordination of care.   Rubie Maid, MD [facility]

## 2017-11-05 ENCOUNTER — Encounter: Payer: Self-pay | Admitting: Obstetrics and Gynecology

## 2017-11-05 DIAGNOSIS — I10 Essential (primary) hypertension: Secondary | ICD-10-CM | POA: Insufficient documentation

## 2017-11-05 DIAGNOSIS — N920 Excessive and frequent menstruation with regular cycle: Secondary | ICD-10-CM | POA: Insufficient documentation

## 2017-11-05 DIAGNOSIS — K429 Umbilical hernia without obstruction or gangrene: Secondary | ICD-10-CM | POA: Insufficient documentation

## 2017-11-05 DIAGNOSIS — N83201 Unspecified ovarian cyst, right side: Secondary | ICD-10-CM | POA: Insufficient documentation

## 2017-11-05 DIAGNOSIS — Z6841 Body Mass Index (BMI) 40.0 and over, adult: Secondary | ICD-10-CM | POA: Insufficient documentation

## 2017-11-05 DIAGNOSIS — D259 Leiomyoma of uterus, unspecified: Secondary | ICD-10-CM | POA: Insufficient documentation

## 2017-11-07 ENCOUNTER — Other Ambulatory Visit: Payer: Self-pay

## 2017-11-22 ENCOUNTER — Telehealth: Payer: Self-pay | Admitting: Obstetrics and Gynecology

## 2017-11-22 NOTE — Telephone Encounter (Signed)
Pt was called back no answer left voice mail to call the office.

## 2017-11-22 NOTE — Telephone Encounter (Signed)
The patient called and stated that she is returning a phone call that she missed from her nurse. No other information was disclosed. Please advise.

## 2017-11-22 NOTE — Telephone Encounter (Signed)
The patient called and stated that she would like to speak with a nurse in regards to her Lupron injection and the status of the medication making it to the office in the next two weeks. No other information was disclosed. Please advise.

## 2017-11-23 NOTE — Telephone Encounter (Signed)
Please see another encounter.

## 2017-11-23 NOTE — Telephone Encounter (Signed)
Pt called and informed that she needed to call [facility] and gave her the phone number [phone].

## 2017-11-24 ENCOUNTER — Telehealth: Payer: Self-pay

## 2017-11-24 NOTE — Telephone Encounter (Signed)
Pt was called to see if she received the message that was left for her yesterday to call [facility] so they can start the process on her Lupron.

## 2017-12-07 ENCOUNTER — Telehealth: Payer: Self-pay

## 2017-12-07 NOTE — Telephone Encounter (Signed)
Pt was called and informed that her Lupron injection had arrived and pt set an appt to see be seen to have her first injection.

## 2017-12-08 ENCOUNTER — Emergency Department: Payer: Medicaid Other

## 2017-12-08 ENCOUNTER — Other Ambulatory Visit: Payer: Self-pay

## 2017-12-08 ENCOUNTER — Encounter: Payer: Self-pay | Admitting: *Deleted

## 2017-12-08 ENCOUNTER — Emergency Department
Admission: EM | Admit: 2017-12-08 | Discharge: 2017-12-08 | Disposition: A | Discharge status: Home / Self Care | Payer: Medicaid Other | Attending: Emergency Medicine | Admitting: Emergency Medicine

## 2017-12-08 DIAGNOSIS — I10 Essential (primary) hypertension: Secondary | ICD-10-CM | POA: Diagnosis not present

## 2017-12-08 DIAGNOSIS — Z79899 Other long term (current) drug therapy: Secondary | ICD-10-CM | POA: Insufficient documentation

## 2017-12-08 DIAGNOSIS — R102 Pelvic and perineal pain unspecified side: Secondary | ICD-10-CM

## 2017-12-08 DIAGNOSIS — R1032 Left lower quadrant pain: Secondary | ICD-10-CM | POA: Diagnosis present

## 2017-12-08 DIAGNOSIS — Z87891 Personal history of nicotine dependence: Secondary | ICD-10-CM | POA: Diagnosis not present

## 2017-12-08 DIAGNOSIS — J45909 Unspecified asthma, uncomplicated: Secondary | ICD-10-CM | POA: Diagnosis not present

## 2017-12-08 DIAGNOSIS — N83201 Unspecified ovarian cyst, right side: Secondary | ICD-10-CM | POA: Diagnosis not present

## 2017-12-08 DIAGNOSIS — D259 Leiomyoma of uterus, unspecified: Secondary | ICD-10-CM

## 2017-12-08 LAB — COMPREHENSIVE METABOLIC PANEL
ALT: 20 U/L (ref 14–54)
AST: 26 U/L (ref 15–41)
Albumin: 3.9 g/dL (ref 3.5–5.0)
Alkaline Phosphatase: 57 U/L (ref 38–126)
Anion gap: 9 (ref 5–15)
BUN: 11 mg/dL (ref 6–20)
CHLORIDE: 104 mmol/L (ref 101–111)
CO2: 26 mmol/L (ref 22–32)
CREATININE: 0.75 mg/dL (ref 0.44–1.00)
Calcium: 8.6 mg/dL — ABNORMAL LOW (ref 8.9–10.3)
GFR calc Af Amer: >60 mL/min (ref >60)
GFR calc non Af Amer: >60 mL/min (ref >60)
Glucose, Bld: 129 mg/dL — ABNORMAL HIGH (ref 65–99)
Potassium: 3.1 mmol/L — ABNORMAL LOW (ref 3.5–5.1)
SODIUM: 139 mmol/L (ref 135–145)
Total Bilirubin: 0.4 mg/dL (ref 0.3–1.2)
Total Protein: 7.1 g/dL (ref 6.5–8.1)

## 2017-12-08 LAB — POCT PREGNANCY, URINE: Preg Test, Ur: NEGATIVE

## 2017-12-08 LAB — CBC
HCT: 37.9 % (ref 35.0–47.0)
Hemoglobin: 12.8 g/dL (ref 12.0–16.0)
MCH: 29.5 pg (ref 26.0–34.0)
MCHC: 33.7 g/dL (ref 32.0–36.0)
MCV: 87.4 fL (ref 80.0–100.0)
PLATELETS: 322 10*3/uL (ref 150–440)
RBC: 4.34 MIL/uL (ref 3.80–5.20)
RDW: 13.9 % (ref 11.5–14.5)
WBC: 5.9 10*3/uL (ref 3.6–11.0)

## 2017-12-08 LAB — URINALYSIS, COMPLETE (UACMP) WITH MICROSCOPIC
BILIRUBIN URINE: NEGATIVE
Bacteria, UA: NONE SEEN
Glucose, UA: NEGATIVE mg/dL
Hgb urine dipstick: NEGATIVE
Ketones, ur: 5 mg/dL — AB
Nitrite: NEGATIVE
PROTEIN: 30 mg/dL — AB
SPECIFIC GRAVITY, URINE: 1.026 (ref 1.005–1.030)
pH: 5 (ref 5.0–8.0)

## 2017-12-08 LAB — LIPASE, BLOOD: LIPASE: 24 U/L (ref 11–51)

## 2017-12-08 NOTE — ED Notes (Signed)
Pt in u/s

## 2017-12-08 NOTE — ED Triage Notes (Signed)
Pt ambulatory to triage. Pt has low abd pain.  Pain for 3 days.  No vag bleeding.  Hx fibroids.  No vag discharge.  No urinary sx.  Pt alert.

## 2017-12-08 NOTE — Discharge Instructions (Addendum)
Your ultrasounds today do not show any acute issues. Please follow up with Dr. Marcelline Mates for continued evaluation of your symptoms.

## 2017-12-08 NOTE — ED Triage Notes (Signed)
First nurse note-here for lower abdominal pain. Reports has hernia and fibroid hx.  Ambulatory. NAD.

## 2017-12-08 NOTE — ED Notes (Signed)
poct pregnancy Negative 

## 2017-12-08 NOTE — ED Notes (Signed)
Pt verbalizes d/c understanding and follow up. Pt in NAD at time of departure. Pt ambulatory. PT signed esignature but did not save due to signature pad malfnx

## 2017-12-08 NOTE — ED Provider Notes (Signed)
[facility] Emergency Department Provider Note  ____________________________________________  Time seen: Approximately 6:28 PM  I have reviewed the triage vital signs and the nursing notes.   HISTORY  Chief Complaint Abdominal Pain    HPI Terri Chambers is a 42 y.o. female with a history of hypertension, obesity, myasthenia gravis, uterine fibroids and right ovarian cyst who complains of pelvic pain for the past 6 days. Onset of pain was during her menstrual cycle and consistent with fibroid-related dysmenorrhea that she has had in the past. However, 3 days ago her bleeding stopped but her pain has persisted. Waxing and waning, constant, nonradiating, worse with sitting upright and leaning over. No alleviating factors. Moderate intensity, sharp.  Denies vaginal discharge. No dysuria frequency or urgency. No fevers chills chest pain or shortness of breath.  She is seeing Dr. Leticia Penna for consideration of hysterectomy, right ovarian cystectomy, and umbilical hernia repair.      Past Medical History:  Diagnosis Date  . Asthma   . Depression   . History of asthma   . Hypertension   . Migraine headache   . Myasthenia gravis   . Obesity      Patient Active Problem List   Diagnosis Date Noted  . Essential hypertension 11/05/2017  . Morbid obesity ([place]) 11/05/2017  . Umbilical hernia without obstruction and without gangrene 11/05/2017  . Menorrhagia with regular cycle 11/05/2017  . Right ovarian cyst 11/05/2017  . Uterine leiomyoma 11/05/2017  . Encounter for therapeutic drug monitoring 10/02/2012  . Myasthenia gravis without exacerbation ([place]) 10/02/2012  . Myasthenia gravis with exacerbation ([place]) 10/02/2012     Past Surgical History:  Procedure Laterality Date  . EAR TUBE REMOVAL       Prior to Admission medications   Medication Sig Start Date End Date Taking? Authorizing Provider  HYDROcodone-acetaminophen (NORCO/VICODIN) 5-325 MG tablet  Take 1 tablet by mouth every 4 (four) hours as needed. 09/20/17   Harvest Dark, MD  ketoconazole (NIZORAL) 2 % shampoo Apply 1 application topically daily. AS DIRECTED 01/26/17   [provider]  lisinopril (PRINIVIL,ZESTRIL) 20 MG tablet Take 20 mg by mouth daily.    [provider]  Norethindrone Acetate-Ethinyl Estradiol (JUNEL 1.5/30) 1.5-30 MG-MCG tablet Take 1 tablet by mouth daily. Take active pills only for 6 weeks. 10/04/17   Rubie Maid, MD     Allergies Patient has no known allergies.   Family History  Problem Relation Age of Onset  . Diabetes Mother   . Arthritis Father   . Hypertension Father   . Cerebral aneurysm Maternal Grandmother   . Congestive Heart Failure Paternal Grandmother   . Cervical cancer Other        Maternal great-grandmother died of cervical cancer  . Other Neg Hx     Social History Social History   Tobacco Use  . Smoking status: Former Smoker    Last attempt to quit: 08/02/2008    Years since quitting: 9.3  . Smokeless tobacco: Never Used  Substance Use Topics  . Alcohol use: No  . Drug use: No    Review of Systems  Constitutional:   No fever or chills.  ENT:   No sore throat. No rhinorrhea. Cardiovascular:   No chest pain or syncope. Respiratory:   No dyspnea or cough. Gastrointestinal:  positive as above for abdominal pain, negative for constipation and vomiting and diarrhea.  Musculoskeletal:   Negative for focal pain or swelling All other systems reviewed and are negative except as  documented above in ROS and HPI.  ____________________________________________   PHYSICAL EXAM:  VITAL SIGNS: ED Triage Vitals  Enc Vitals Group     BP 12/08/17 1529 (!) 150/100     Pulse Rate 12/08/17 1529 76     Resp 12/08/17 1529 20     Temp 12/08/17 1529 98.9 F (37.2 C)     Temp Source 12/08/17 1529 Oral     SpO2 12/08/17 1529 96 %     Weight 12/08/17 1528 290 lb (131.5 kg)     Height 12/08/17 1528 5\' 8"  (1.727 m)      Head Circumference --      Peak Flow --      Pain Score 12/08/17 1528 4     Pain Loc --      Pain Edu? --      Excl. in [place]? --     Vital signs reviewed, nursing assessments reviewed.   Constitutional:   Alert and oriented. Well appearing and in no distress. Eyes:   Conjunctivae are normal. EOMI. PERRL. ENT      Head:   Normocephalic and atraumatic.      Nose:   No congestion/rhinnorhea.       Mouth/Throat:   MMM, no pharyngeal erythema. No peritonsillar mass.       Neck:   No meningismus. Full ROM. Hematological/Lymphatic/Immunilogical:   No cervical lymphadenopathy. Cardiovascular:   RRR. Symmetric bilateral radial and DP pulses.  No murmurs.  Respiratory:   Normal respiratory effort without tachypnea/retractions. Breath sounds are clear and equal bilaterally. No wheezes/rales/rhonchi. Gastrointestinal:   Soft with right midabdominal tenderness, negative Murphy sign, and left lower quadrant tenderness. There is suprapubic tenderness as well over an enlarged uterus.. Mildly distended. There is no CVA tenderness.  No rebound, rigidity, or guarding.  Musculoskeletal:   Normal range of motion in all extremities. No joint effusions.  No lower extremity tenderness.  No edema. Neurologic:   Normal speech and language.  Motor grossly intact. No acute focal neurologic deficits are appreciated.  Skin:    Skin is warm, dry and intact. No rash noted.  No petechiae, purpura, or bullae.  ____________________________________________    LABS (pertinent positives/negatives) (all labs ordered are listed, but only abnormal results are displayed) Labs Reviewed  COMPREHENSIVE METABOLIC PANEL - Abnormal; Notable for the following components:      Result Value   Potassium 3.1 (*)    Glucose, Bld 129 (*)    Calcium 8.6 (*)    All other components within normal limits  URINALYSIS, COMPLETE (UACMP) WITH MICROSCOPIC - Abnormal; Notable for the following components:   Color, Urine AMBER (*)     APPearance HAZY (*)    Ketones, ur 5 (*)    Protein, ur 30 (*)    Leukocytes, UA TRACE (*)    All other components within normal limits  LIPASE, BLOOD  CBC  POC URINE PREG, ED  POCT PREGNANCY, URINE   ____________________________________________   EKG    ____________________________________________    RADIOLOGY  US Transvaginal Non-ob  Result Date: 12/08/2017 CLINICAL DATA:  Left lower quadrant pelvic pain EXAM: TRANSABDOMINAL AND TRANSVAGINAL ULTRASOUND OF PELVIS DOPPLER ULTRASOUND OF OVARIES TECHNIQUE: Both transabdominal and transvaginal ultrasound examinations of the pelvis were performed. Transabdominal technique was performed for global imaging of the pelvis including uterus, ovaries, adnexal regions, and pelvic cul-de-sac. It was necessary to proceed with endovaginal exam following the transabdominal exam to visualize the uterus and ovaries. Color and duplex Doppler ultrasound was utilized  to evaluate blood flow to the ovaries. COMPARISON:  Pelvic ultrasound 11/01/2017 FINDINGS: Uterus Measurements: 15.7 x 6.5 x 12.2 cm. There are multiple fibroids. Posterior fundal fibroid measures 5.2 x 4.3 x 4.5 cm. A more superior fundal fibroid measures 3.9 x 3.4 x 3.6 cm. Anterior lower uterine segment fibroid measures 3.7 x 3.6 x 3.6 cm. Endometrium Thickness: 5 mm.  No focal abnormality visualized. Right ovary Measurements: 5.6 x 4.6 x 5.1 cm. Visualization of the right ovary is limited by patient body habitus and adjacent fibroids. Complex cystic lesion of the right ovary measures 4.1 x 4.0 x 4.6 cm. Left ovary Measurements: 3.1 x 2.4 x 2.4 cm. Normal appearance/no adnexal mass. Pulsed Doppler evaluation of the left ovary demonstrates normal low-resistance arterial and venous waveforms. Normal venous waveform of the right ovary was obtained, but the arterial waveform was not detected, likely due to limitations caused by patient body habitus. Other findings No abnormal free fluid. IMPRESSION: 1.  No left ovarian torsion. 2. Right ovarian arterial waveform could not be confirmed, likely secondary to limitations caused by patient body habitus. The size of the right ovary is smaller than that reported on the study of 11/01/2017. 3. Complex right ovarian cyst measuring 4.1 x 4.0 x 4.6 cm. 4. Multiple uterine fibroids. Left lower quadrant pelvic pain please select correct "US Pelvis" template depending on combination of exams performed / being read (e.g. both, Doppler, etc). Electronically Signed   By: Ulyses Jarred M.D.   On: 12/08/2017 20:10   US Pelvis Complete  Result Date: 12/08/2017 CLINICAL DATA:  Left lower quadrant pelvic pain EXAM: TRANSABDOMINAL AND TRANSVAGINAL ULTRASOUND OF PELVIS DOPPLER ULTRASOUND OF OVARIES TECHNIQUE: Both transabdominal and transvaginal ultrasound examinations of the pelvis were performed. Transabdominal technique was performed for global imaging of the pelvis including uterus, ovaries, adnexal regions, and pelvic cul-de-sac. It was necessary to proceed with endovaginal exam following the transabdominal exam to visualize the uterus and ovaries. Color and duplex Doppler ultrasound was utilized to evaluate blood flow to the ovaries. COMPARISON:  Pelvic ultrasound 11/01/2017 FINDINGS: Uterus Measurements: 15.7 x 6.5 x 12.2 cm. There are multiple fibroids. Posterior fundal fibroid measures 5.2 x 4.3 x 4.5 cm. A more superior fundal fibroid measures 3.9 x 3.4 x 3.6 cm. Anterior lower uterine segment fibroid measures 3.7 x 3.6 x 3.6 cm. Endometrium Thickness: 5 mm.  No focal abnormality visualized. Right ovary Measurements: 5.6 x 4.6 x 5.1 cm. Visualization of the right ovary is limited by patient body habitus and adjacent fibroids. Complex cystic lesion of the right ovary measures 4.1 x 4.0 x 4.6 cm. Left ovary Measurements: 3.1 x 2.4 x 2.4 cm. Normal appearance/no adnexal mass. Pulsed Doppler evaluation of the left ovary demonstrates normal low-resistance arterial and venous  waveforms. Normal venous waveform of the right ovary was obtained, but the arterial waveform was not detected, likely due to limitations caused by patient body habitus. Other findings No abnormal free fluid. IMPRESSION: 1. No left ovarian torsion. 2. Right ovarian arterial waveform could not be confirmed, likely secondary to limitations caused by patient body habitus. The size of the right ovary is smaller than that reported on the study of 11/01/2017. 3. Complex right ovarian cyst measuring 4.1 x 4.0 x 4.6 cm. 4. Multiple uterine fibroids. Left lower quadrant pelvic pain please select correct "US Pelvis" template depending on combination of exams performed / being read (e.g. both, Doppler, etc). Electronically Signed   By: Ulyses Jarred M.D.   On: 12/08/2017 20:10  US Pelvic Doppler (torsion R/o Or Mass Arterial Flow)  Result Date: 12/08/2017 CLINICAL DATA:  Left lower quadrant pelvic pain EXAM: TRANSABDOMINAL AND TRANSVAGINAL ULTRASOUND OF PELVIS DOPPLER ULTRASOUND OF OVARIES TECHNIQUE: Both transabdominal and transvaginal ultrasound examinations of the pelvis were performed. Transabdominal technique was performed for global imaging of the pelvis including uterus, ovaries, adnexal regions, and pelvic cul-de-sac. It was necessary to proceed with endovaginal exam following the transabdominal exam to visualize the uterus and ovaries. Color and duplex Doppler ultrasound was utilized to evaluate blood flow to the ovaries. COMPARISON:  Pelvic ultrasound 11/01/2017 FINDINGS: Uterus Measurements: 15.7 x 6.5 x 12.2 cm. There are multiple fibroids. Posterior fundal fibroid measures 5.2 x 4.3 x 4.5 cm. A more superior fundal fibroid measures 3.9 x 3.4 x 3.6 cm. Anterior lower uterine segment fibroid measures 3.7 x 3.6 x 3.6 cm. Endometrium Thickness: 5 mm.  No focal abnormality visualized. Right ovary Measurements: 5.6 x 4.6 x 5.1 cm. Visualization of the right ovary is limited by patient body habitus and adjacent  fibroids. Complex cystic lesion of the right ovary measures 4.1 x 4.0 x 4.6 cm. Left ovary Measurements: 3.1 x 2.4 x 2.4 cm. Normal appearance/no adnexal mass. Pulsed Doppler evaluation of the left ovary demonstrates normal low-resistance arterial and venous waveforms. Normal venous waveform of the right ovary was obtained, but the arterial waveform was not detected, likely due to limitations caused by patient body habitus. Other findings No abnormal free fluid. IMPRESSION: 1. No left ovarian torsion. 2. Right ovarian arterial waveform could not be confirmed, likely secondary to limitations caused by patient body habitus. The size of the right ovary is smaller than that reported on the study of 11/01/2017. 3. Complex right ovarian cyst measuring 4.1 x 4.0 x 4.6 cm. 4. Multiple uterine fibroids. Left lower quadrant pelvic pain please select correct "US Pelvis" template depending on combination of exams performed / being read (e.g. both, Doppler, etc). Electronically Signed   By: Ulyses Jarred M.D.   On: 12/08/2017 20:10   US Abdomen Limited Ruq  Result Date: 12/08/2017 CLINICAL DATA:  RIGHT upper quadrant tenderness for 3 days. EXAM: ULTRASOUND ABDOMEN LIMITED RIGHT UPPER QUADRANT COMPARISON:  CT abdomen dated 09/20/2017. FINDINGS: Gallbladder: Small echogenic nodule along the posterior wall of the gallbladder appears to be nonmobile and is, therefore, most likely a small gallbladder wall polyp. No gallstones seen. No gallbladder wall thickening, pericholecystic fluid or other signs of acute cholecystitis. No sonographic Murphy's sign elicited during the exam. Common bile duct: Diameter: 3 mm Liver: No focal lesion identified. Within normal limits in parenchymal echogenicity. Portal vein is patent on color Doppler imaging with normal direction of blood flow towards the liver. IMPRESSION: 1. Probable small gallbladder wall polyp measuring 4 mm, less likely small adherent gallstone. 2. No evidence of cholecystitis.  3. No bile duct dilatation. 4. Liver is unremarkable. Electronically Signed   By: Franki Cabot M.D.   On: 12/08/2017 19:18    ____________________________________________   PROCEDURES Procedures  ____________________________________________  DIFFERENTIAL DIAGNOSIS   biliary colic, ruptured ovarian cyst, ovarian torsion, symptomatic fibroids  CLINICAL IMPRESSION / ASSESSMENT AND PLAN / ED COURSE  Pertinent labs & imaging results that were available during my care of the patient were reviewed by me and considered in my medical decision making (see chart for details).    patient well-appearing no acute distress, unremarkable vital signs. No evidence of incarcerated hernia. Doubt bowel obstruction or free air appendicitis. Patient is not septic, calm and comfortable. Given  her history, I'll obtain pelvic ultrasound as well as ultrasound of the right upper quadrant for further evaluation.  Clinical Course as of Dec 08 2101  Thu Dec 08, 2017  1921 [place] ruq unremarkable. No gallstones or cholecystitis   [PS]  2029 [place] negative. Presentation not c/w torsion based on current results. Will DC to f/u with her gyn Dr. Marcelline Mates.    [PS]    Clinical Course User Index [PS] Carrie Mew, MD     ____________________________________________   FINAL CLINICAL IMPRESSION(S) / ED DIAGNOSES    Final diagnoses:  Pelvic pain  Uterine leiomyoma, unspecified location  Right ovarian cyst     ED Discharge Orders    None      Portions of this note were generated with dragon dictation software. Dictation errors may occur despite best attempts at proofreading.    Carrie Mew, MD 12/08/17 2103

## 2017-12-09 ENCOUNTER — Ambulatory Visit (INDEPENDENT_AMBULATORY_CARE_PROVIDER_SITE_OTHER): Payer: Medicaid Other | Admitting: Obstetrics and Gynecology

## 2017-12-09 VITALS — BP 165/117 | HR 78 | Ht 68.0 in | Wt 295.2 lb

## 2017-12-09 DIAGNOSIS — Z79818 Long term (current) use of other agents affecting estrogen receptors and estrogen levels: Secondary | ICD-10-CM | POA: Diagnosis not present

## 2017-12-09 DIAGNOSIS — D259 Leiomyoma of uterus, unspecified: Secondary | ICD-10-CM | POA: Diagnosis not present

## 2017-12-09 MED ORDER — LEUPROLIDE ACETATE 3.75 MG IM KIT
3.7500 mg | PACK | Freq: Once | INTRAMUSCULAR | Status: AC
  Administered 2017-12-09: 3.75 mg via INTRAMUSCULAR

## 2017-12-09 NOTE — Progress Notes (Signed)
Pt is here for a NV for 1st Lupron inj. Tolerated well.

## 2017-12-15 ENCOUNTER — Encounter: Payer: Self-pay | Admitting: Obstetrics and Gynecology

## 2017-12-15 NOTE — Progress Notes (Signed)
I have reviewed the record and concur with patient management and plan.  Edrei Norgaard, MD [facility]     

## 2018-01-13 ENCOUNTER — Other Ambulatory Visit: Payer: Self-pay

## 2018-01-13 NOTE — Telephone Encounter (Signed)
Pt was called and informed that [facility] was waiting on her to give them a call so they will ship the medication. [facility] number was given to pt and she stated that she would call them as soon as she got off the phone.

## 2018-01-25 ENCOUNTER — Other Ambulatory Visit: Payer: Self-pay

## 2018-01-25 NOTE — Telephone Encounter (Signed)
Pt was called and informed that her Lupron was at the office and she made an appointment to have the injection.

## 2018-01-26 ENCOUNTER — Telehealth: Payer: Self-pay | Admitting: Obstetrics and Gynecology

## 2018-01-26 NOTE — Telephone Encounter (Signed)
The patient called and stated that she needs to reschedule her apt due to some one making a schedule error, I informed the patient that her appointment was scheduled for Friday 6/28 because we do not have a nurse on 6/27. I offered an apt to the patient the morning of 6/28 before she goes in for work at [employer]) the patient declined and stated that she would like to come in on Tuesday 7/2. The patient was rescheduled to that requested day. Please advise.

## 2018-01-27 ENCOUNTER — Ambulatory Visit: Payer: Medicaid Other

## 2018-01-31 ENCOUNTER — Ambulatory Visit (INDEPENDENT_AMBULATORY_CARE_PROVIDER_SITE_OTHER): Payer: Medicaid Other | Admitting: Obstetrics and Gynecology

## 2018-01-31 VITALS — BP 148/90 | HR 98 | Wt 297.4 lb

## 2018-01-31 DIAGNOSIS — D259 Leiomyoma of uterus, unspecified: Secondary | ICD-10-CM | POA: Diagnosis not present

## 2018-01-31 DIAGNOSIS — Z79818 Long term (current) use of other agents affecting estrogen receptors and estrogen levels: Secondary | ICD-10-CM | POA: Diagnosis not present

## 2018-01-31 MED ORDER — LEUPROLIDE ACETATE 3.75 MG IM KIT
3.7500 mg | PACK | Freq: Once | INTRAMUSCULAR | Status: AC
  Administered 2018-01-31: 3.75 mg via INTRAMUSCULAR

## 2018-01-31 NOTE — Progress Notes (Signed)
Pt is here for her 2nd Lupron injection, pt states she "is already feeling better"

## 2018-02-28 ENCOUNTER — Ambulatory Visit: Payer: Medicaid Other

## 2018-03-07 ENCOUNTER — Emergency Department
Admission: EM | Admit: 2018-03-07 | Discharge: 2018-03-07 | Disposition: A | Discharge status: Home / Self Care | Payer: Medicaid Other | Attending: Emergency Medicine | Admitting: Emergency Medicine

## 2018-03-07 ENCOUNTER — Other Ambulatory Visit: Payer: Self-pay

## 2018-03-07 ENCOUNTER — Encounter: Payer: Self-pay | Admitting: Emergency Medicine

## 2018-03-07 ENCOUNTER — Emergency Department: Payer: Medicaid Other

## 2018-03-07 DIAGNOSIS — R112 Nausea with vomiting, unspecified: Secondary | ICD-10-CM | POA: Diagnosis not present

## 2018-03-07 DIAGNOSIS — N390 Urinary tract infection, site not specified: Secondary | ICD-10-CM | POA: Insufficient documentation

## 2018-03-07 DIAGNOSIS — Z87891 Personal history of nicotine dependence: Secondary | ICD-10-CM | POA: Diagnosis not present

## 2018-03-07 DIAGNOSIS — I10 Essential (primary) hypertension: Secondary | ICD-10-CM | POA: Insufficient documentation

## 2018-03-07 DIAGNOSIS — R109 Unspecified abdominal pain: Secondary | ICD-10-CM | POA: Diagnosis present

## 2018-03-07 DIAGNOSIS — R1084 Generalized abdominal pain: Secondary | ICD-10-CM

## 2018-03-07 DIAGNOSIS — J45909 Unspecified asthma, uncomplicated: Secondary | ICD-10-CM | POA: Diagnosis not present

## 2018-03-07 DIAGNOSIS — Z79899 Other long term (current) drug therapy: Secondary | ICD-10-CM | POA: Insufficient documentation

## 2018-03-07 DIAGNOSIS — R197 Diarrhea, unspecified: Secondary | ICD-10-CM | POA: Insufficient documentation

## 2018-03-07 LAB — CBC
HCT: 38.6 % (ref 35.0–47.0)
Hemoglobin: 12.9 g/dL (ref 12.0–16.0)
MCH: 29.4 pg (ref 26.0–34.0)
MCHC: 33.6 g/dL (ref 32.0–36.0)
MCV: 87.6 fL (ref 80.0–100.0)
PLATELETS: 277 10*3/uL (ref 150–440)
RBC: 4.4 MIL/uL (ref 3.80–5.20)
RDW: 14.4 % (ref 11.5–14.5)
WBC: 8 10*3/uL (ref 3.6–11.0)

## 2018-03-07 LAB — COMPREHENSIVE METABOLIC PANEL
ALT: 24 U/L (ref 0–44)
AST: 27 U/L (ref 15–41)
Albumin: 3.6 g/dL (ref 3.5–5.0)
Alkaline Phosphatase: 52 U/L (ref 38–126)
Anion gap: 8 (ref 5–15)
BUN: 10 mg/dL (ref 6–20)
CHLORIDE: 106 mmol/L (ref 98–111)
CO2: 29 mmol/L (ref 22–32)
CREATININE: 0.68 mg/dL (ref 0.44–1.00)
Calcium: 8.9 mg/dL (ref 8.9–10.3)
GFR calc Af Amer: >60 mL/min (ref >60)
GFR calc non Af Amer: >60 mL/min (ref >60)
GLUCOSE: 115 mg/dL — AB (ref 70–99)
Potassium: 3.3 mmol/L — ABNORMAL LOW (ref 3.5–5.1)
SODIUM: 143 mmol/L (ref 135–145)
Total Bilirubin: 0.5 mg/dL (ref 0.3–1.2)
Total Protein: 6.5 g/dL (ref 6.5–8.1)

## 2018-03-07 LAB — URINALYSIS, COMPLETE (UACMP) WITH MICROSCOPIC
Bacteria, UA: NONE SEEN
Bilirubin Urine: NEGATIVE
GLUCOSE, UA: NEGATIVE mg/dL
Ketones, ur: NEGATIVE mg/dL
Nitrite: NEGATIVE
PH: 6 (ref 5.0–8.0)
Protein, ur: NEGATIVE mg/dL
SPECIFIC GRAVITY, URINE: 1.02 (ref 1.005–1.030)

## 2018-03-07 LAB — LIPASE, BLOOD: LIPASE: 28 U/L (ref 11–51)

## 2018-03-07 LAB — POCT PREGNANCY, URINE: Preg Test, Ur: NEGATIVE

## 2018-03-07 MED ORDER — ONDANSETRON 4 MG PO TBDP: 4.0000 mg | ORAL_TABLET | Freq: Three times a day (TID) | ORAL | 0 refills | Status: DC | PRN

## 2018-03-07 MED ORDER — ONDANSETRON HCL 4 MG/2ML IJ SOLN
4.0000 mg | Freq: Once | INTRAMUSCULAR | Status: AC
  Administered 2018-03-07: 4 mg via INTRAVENOUS
  Filled 2018-03-07: qty 2

## 2018-03-07 MED ORDER — FENTANYL CITRATE (PF) 100 MCG/2ML IJ SOLN
50.0000 ug | Freq: Once | INTRAMUSCULAR | Status: AC
  Administered 2018-03-07: 50 ug via INTRAVENOUS
  Filled 2018-03-07: qty 2

## 2018-03-07 MED ORDER — SODIUM CHLORIDE 0.9 % IV BOLUS
1000.0000 mL | Freq: Once | INTRAVENOUS | Status: AC
  Administered 2018-03-07: 1000 mL via INTRAVENOUS

## 2018-03-07 MED ORDER — DICYCLOMINE HCL 20 MG PO TABS: 20.0000 mg | ORAL_TABLET | Freq: Four times a day (QID) | ORAL | 0 refills | Status: DC | PRN

## 2018-03-07 MED ORDER — IOPAMIDOL (ISOVUE-300) INJECTION 61%
30.0000 mL | Freq: Once | INTRAVENOUS | Status: AC
  Administered 2018-03-07: 30 mL via ORAL

## 2018-03-07 MED ORDER — IOPAMIDOL (ISOVUE-300) INJECTION 61%
125.0000 mL | Freq: Once | INTRAVENOUS | Status: AC | PRN
  Administered 2018-03-07: 125 mL via INTRAVENOUS

## 2018-03-07 MED ORDER — CEFTRIAXONE SODIUM 1 G IJ SOLR
1.0000 g | Freq: Once | INTRAMUSCULAR | Status: AC
  Administered 2018-03-07: 1 g via INTRAVENOUS
  Filled 2018-03-07: qty 10

## 2018-03-07 NOTE — ED Notes (Signed)
Pt went to CT

## 2018-03-07 NOTE — ED Triage Notes (Signed)
Pt c/o abdominal pain on Sunday and tonight pt is having N/V. Pt denies diarrhea. Pt reports she was diagnosed in April with multiple fibroids in the uterus that caused pt to have hernia in lower abdomen. Pt currently taking Lupron shots.

## 2018-03-07 NOTE — Discharge Instructions (Signed)
1.  Return stool sample to outpatient laboratory at your convenience. 2.  You may take medicines as needed for abdominal discomfort and nausea (Bentyl/Zofran #20). 3.  Clear liquids x12 hours, then [employer] x3 days, then slowly advance diet as tolerated. 4.  Return to the ER for worsening symptoms, persistent vomiting, fever, difficulty breathing or other concerns.

## 2018-03-07 NOTE — ED Provider Notes (Signed)
[facility] Emergency Department Provider Note   ____________________________________________   First MD Initiated Contact with Patient 03/07/18 [phone]     (approximate)  I have reviewed the triage vital signs and the nursing notes.   HISTORY  Chief Complaint Abdominal Pain; Nausea; and Emesis    HPI Terri Chambers is a 42 y.o. female who presents to the ED from home with a chief complaint of abdominal pain, nausea, vomiting and diarrhea.  Patient reports onset of midline abdominal pain 3 days ago associated with nausea, no vomiting.  Had one episode of vomiting today with multiple loose stools.  Denies associated fever, chills, chest pain, shortness of breath, dysuria.  Denies recent travel or trauma.  Taking Lupron shots for uterine fibroids.   Past Medical History:  Diagnosis Date  . Asthma   . Depression   . History of asthma   . Hypertension   . Migraine headache   . Myasthenia gravis   . Obesity     Patient Active Problem List   Diagnosis Date Noted  . Essential hypertension 11/05/2017  . Morbid obesity ([place]) 11/05/2017  . Umbilical hernia without obstruction and without gangrene 11/05/2017  . Menorrhagia with regular cycle 11/05/2017  . Right ovarian cyst 11/05/2017  . Uterine leiomyoma 11/05/2017  . Encounter for therapeutic drug monitoring 10/02/2012  . Myasthenia gravis without exacerbation ([place]) 10/02/2012  . Myasthenia gravis with exacerbation ([place]) 10/02/2012    Past Surgical History:  Procedure Laterality Date  . EAR TUBE REMOVAL      Prior to Admission medications   Medication Sig Start Date End Date Taking? Authorizing Provider  atenolol (TENORMIN) 50 MG tablet Take by mouth.    [provider]  dicyclomine (BENTYL) 20 MG tablet Take 1 tablet (20 mg total) by mouth every 6 (six) hours as needed. 03/07/18   Paulette Blanch, MD  HYDROcodone-acetaminophen (NORCO/VICODIN) 5-325 MG tablet Take 1 tablet by mouth every 4  (four) hours as needed. Patient not taking: Reported on 12/09/2017 09/20/17   Harvest Dark, MD  ketoconazole (NIZORAL) 2 % shampoo Apply 1 application topically daily. AS DIRECTED 01/26/17   [provider]  ketoconazole (NIZORAL) 2 % shampoo  01/26/17   [provider]  lisinopril (PRINIVIL,ZESTRIL) 20 MG tablet Take 20 mg by mouth daily.    [provider]  Norethindrone Acetate-Ethinyl Estradiol (JUNEL 1.5/30) 1.5-30 MG-MCG tablet Take 1 tablet by mouth daily. Take active pills only for 6 weeks. Patient not taking: Reported on 12/09/2017 10/04/17   Rubie Maid, MD  ondansetron (ZOFRAN ODT) 4 MG disintegrating tablet Take 1 tablet (4 mg total) by mouth every 8 (eight) hours as needed for nausea or vomiting. 03/07/18   Paulette Blanch, MD    Allergies Patient has no known allergies.  Family History  Problem Relation Age of Onset  . Diabetes Mother   . Arthritis Father   . Hypertension Father   . Cerebral aneurysm Maternal Grandmother   . Congestive Heart Failure Paternal Grandmother   . Cervical cancer Other        Maternal great-grandmother died of cervical cancer  . Other Neg Hx     Social History Social History   Tobacco Use  . Smoking status: Former Smoker    Last attempt to quit: 08/02/2008    Years since quitting: 9.6  . Smokeless tobacco: Never Used  Substance Use Topics  . Alcohol use: No  . Drug use: No    Review of Systems  Constitutional: No fever/chills Eyes: No visual changes. ENT: No sore throat. Cardiovascular: Denies chest pain. Respiratory: Denies shortness of breath. Gastrointestinal: Positive for abdominal pain, nausea, vomiting and diarrhea.  No constipation. Genitourinary: Negative for dysuria. Musculoskeletal: Negative for back pain. Skin: Negative for rash. Neurological: Negative for headaches, focal weakness or numbness.   ____________________________________________   PHYSICAL EXAM:  VITAL SIGNS: ED Triage Vitals   Enc Vitals Group     BP 03/07/18 0038 (!) 166/99     Pulse Rate 03/07/18 0038 76     Resp 03/07/18 0038 17     Temp 03/07/18 0038 99 F (37.2 C)     Temp Source 03/07/18 0038 Oral     SpO2 03/07/18 0038 97 %     Weight 03/07/18 0039 297 lb (134.7 kg)     Height --      Head Circumference --      Peak Flow --      Pain Score 03/07/18 0039 0     Pain Loc --      Pain Edu? --      Excl. in [place]? --     Constitutional: Alert and oriented. Well appearing and in mild acute distress. Eyes: Conjunctivae are normal. PERRL. EOMI. Head: Atraumatic. Nose: No congestion/rhinnorhea. Mouth/Throat: Mucous membranes are moist.  Oropharynx non-erythematous. Neck: No stridor.   Cardiovascular: Normal rate, regular rhythm. Grossly normal heart sounds.  Good peripheral circulation. Respiratory: Normal respiratory effort.  No retractions. Lungs CTAB. Gastrointestinal: Soft and mildly diffusely tender to palpation without rebound or guarding. No distention. No abdominal bruits. No CVA tenderness. Musculoskeletal: No lower extremity tenderness nor edema.  No joint effusions. Neurologic:  Normal speech and language. No gross focal neurologic deficits are appreciated. No gait instability. Skin:  Skin is warm, dry and intact. No rash noted. Psychiatric: Mood and affect are normal. Speech and behavior are normal.  ____________________________________________   LABS (all labs ordered are listed, but only abnormal results are displayed)  Labs Reviewed  COMPREHENSIVE METABOLIC PANEL - Abnormal; Notable for the following components:      Result Value   Potassium 3.3 (*)    Glucose, Bld 115 (*)    All other components within normal limits  URINALYSIS, COMPLETE (UACMP) WITH MICROSCOPIC - Abnormal; Notable for the following components:   Color, Urine YELLOW (*)    APPearance HAZY (*)    Hgb urine dipstick SMALL (*)    Leukocytes, UA TRACE (*)    All other components within normal limits  C DIFFICILE  QUICK SCREEN W PCR REFLEX  GASTROINTESTINAL PANEL BY PCR, STOOL (REPLACES STOOL CULTURE)  LIPASE, BLOOD  CBC  POCT PREGNANCY, URINE  POC URINE PREG, ED   ____________________________________________  EKG  None ____________________________________________  RADIOLOGY  ED MD interpretation: No acute process  Official radiology report(s): Ct Abdomen Pelvis W Contrast  Result Date: 03/07/2018 CLINICAL DATA:  42 y/o  F; abdominal pain, nausea, vomiting. EXAM: CT ABDOMEN AND PELVIS WITH CONTRAST TECHNIQUE: Multidetector CT imaging of the abdomen and pelvis was performed using the standard protocol following bolus administration of intravenous contrast. CONTRAST:  128m ISOVUE-300 IOPAMIDOL (ISOVUE-300) INJECTION 61% COMPARISON:  09/20/2017 CT abdomen and pelvis. 11/01/2017 pelvic ultrasound FINDINGS: Lower chest: No acute abnormality. Hepatobiliary: Hepatomegaly. No focal liver abnormality is seen. No gallstones, gallbladder wall thickening, or biliary dilatation. Pancreas: Unremarkable. No pancreatic ductal dilatation or surrounding inflammatory changes. Spleen: Normal in size without focal abnormality. Adrenals/Urinary Tract: Adrenal glands are unremarkable. Kidneys are normal, without renal calculi,  focal lesion, or hydronephrosis. Bladder is unremarkable. Stomach/Bowel: Stomach is within normal limits. Appendix appears normal. No evidence of bowel wall thickening, distention, or inflammatory changes. Vascular/Lymphatic: No significant vascular findings are present. No enlarged abdominal or pelvic lymph nodes. 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 Multiple stable uterine myomas. Other: Small paraumbilical hernia containing fat is stable. Musculoskeletal: No fracture is seen. Mild S-shaped curvature of the spine. IMPRESSION: 1. No acute process identified. 2. Stable right adnexal dermoid cysts.  Stable uterine myomas. 3. Hepatomegaly. 4. Mild S-shaped curvature of the spine.  Electronically Signed   By: Kristine Garbe M.D.   On: 03/07/2018 05:01    ____________________________________________   PROCEDURES  Procedure(s) performed: None  Procedures  Critical Care performed: No  ____________________________________________   INITIAL IMPRESSION / ASSESSMENT AND PLAN / ED COURSE  As part of my medical decision making, I reviewed the following data within the [place] notes reviewed and incorporated, Labs reviewed, Radiograph reviewed and Notes from prior ED visits   42 year old female who presents with abdominal pain, nausea, vomiting and diarrhea. Differential diagnosis includes, but is not limited to, biliary disease (biliary colic, acute cholecystitis, cholangitis, choledocholithiasis, etc), intrathoracic causes for epigastric abdominal pain including ACS, gastritis, duodenitis, pancreatitis, enteritis, colitis, diverticulitis, small bowel or large bowel obstruction, abdominal aortic aneurysm, hernia, and ulcer(s).  Laboratory results remarkable for mild UTI.  Will initiate IV fluid resuscitation, 50 mcg IV fentanyl for pain, paired with 4 mg IV Zofran for nausea and proceed with CT abdomen/pelvis to evaluate etiology of patient's symptoms.  Will collect stool specimen if patient is able to produce one.  Clinical Course as of Mar 07 613  Tue Mar 07, 2018  6237 Patient feeling significantly better.  Tolerate oral contrast without emesis.  Stable pelvic findings on CT scan.  No stool while in ED. Will sent home with stool collection kit for her to return at her convenience.  Discharge home with prescriptions for ODT Zofran as needed, Bentyl.  Single dose Rocephin in the ED for trace UTI.  Patient will follow-up with her PCP this week.  Strict return precautions given.  Patient verbalizes understanding agrees with plan of care.   [JS]    Clinical Course User Index [JS] Paulette Blanch, MD      ____________________________________________   FINAL CLINICAL IMPRESSION(S) / ED DIAGNOSES  Final diagnoses:  Nausea vomiting and diarrhea  Generalized abdominal pain  Lower urinary tract infectious disease     ED Discharge Orders        Ordered    dicyclomine (BENTYL) 20 MG tablet  Every 6 hours PRN     03/07/18 0523    ondansetron (ZOFRAN ODT) 4 MG disintegrating tablet  Every 8 hours PRN     03/07/18 0523       Note:  This document was prepared using Dragon voice recognition software and may include unintentional dictation errors.    Paulette Blanch, MD 03/07/18 [phone]

## 2018-03-15 ENCOUNTER — Ambulatory Visit (INDEPENDENT_AMBULATORY_CARE_PROVIDER_SITE_OTHER): Payer: Medicaid Other | Admitting: Obstetrics and Gynecology

## 2018-03-15 ENCOUNTER — Encounter: Payer: Self-pay | Admitting: Obstetrics and Gynecology

## 2018-03-15 VITALS — BP 159/106 | HR 76 | Ht 68.0 in | Wt 295.4 lb

## 2018-03-15 DIAGNOSIS — N920 Excessive and frequent menstruation with regular cycle: Secondary | ICD-10-CM | POA: Diagnosis not present

## 2018-03-15 DIAGNOSIS — Z79818 Long term (current) use of other agents affecting estrogen receptors and estrogen levels: Secondary | ICD-10-CM

## 2018-03-15 DIAGNOSIS — D259 Leiomyoma of uterus, unspecified: Secondary | ICD-10-CM | POA: Diagnosis not present

## 2018-03-15 DIAGNOSIS — Z6841 Body Mass Index (BMI) 40.0 and over, adult: Secondary | ICD-10-CM

## 2018-03-15 NOTE — Progress Notes (Signed)
Pt stated that she wants to discuss fibroids and other options.

## 2018-03-15 NOTE — Progress Notes (Signed)
    GYNECOLOGY PROGRESS NOTE  Subjective:    Patient ID: Terri Chambers, female    DOB: [birth year], 42 y.o.   MRN: [id number]  HPI  Patient is a 42 y.o. G63P1001 female who presents for discussion of options. She has a with a PMH of asthma, HTN, migraines, morbid obesity, and myasthenia gravis. Patient is currently receiving Lupron injections (has received 2 of 6 total injections) for management of large symptomatic uterine fibroids and  menorrhagia, however notes that it has been very difficult in dealing with the company to receive her injections each month.  She desires to know if there is a way to assess if the medication is working, or if she should discontinue use and consider an alternative option.   The following portions of the patient's history were reviewed and updated as appropriate: allergies, current medications, past family history, past medical history, past social history, past surgical history and problem list.  Review of Systems Pertinent items noted in HPI and remainder of comprehensive ROS otherwise negative.   Objective:   Blood pressure (!) 159/106, pulse 76, height 5\' 8"  (1.727 m), weight 295 lb 6.4 oz (134 kg), last menstrual period 02/12/2018. Body mass index is 44.92 kg/m.   General appearance: alert and no distress Exam deferred.    Assessment:   Lupron use Enlarged fibroid uterus Menorrhagia Multiple comorbidities  Plan:   - Discussed ordering pelvic ultrasound to assess for any changes in uterine and fibroid size since initiation of Lupron injections. Cautioned that patient has only had 2 injections and noticeable changes may not be appreciated until after the third dose.  Patient notes understanding.  - She is due for her next dose today, however has not been received by the company as of yet.  Is supposed to be overnight shipped. Will have patient f/u tomorrow for ultrasound and injection. Will notify patient of results to see if she desires to continue  use of the medication after this.    Rubie Maid, MD [facility]

## 2018-03-16 ENCOUNTER — Other Ambulatory Visit: Payer: Self-pay | Admitting: Obstetrics and Gynecology

## 2018-03-16 ENCOUNTER — Encounter: Payer: Self-pay | Admitting: Obstetrics and Gynecology

## 2018-03-16 ENCOUNTER — Ambulatory Visit (INDEPENDENT_AMBULATORY_CARE_PROVIDER_SITE_OTHER): Payer: Medicaid Other | Admitting: Obstetrics and Gynecology

## 2018-03-16 ENCOUNTER — Ambulatory Visit (INDEPENDENT_AMBULATORY_CARE_PROVIDER_SITE_OTHER): Payer: Medicaid Other

## 2018-03-16 VITALS — BP 155/90 | HR 73 | Ht 68.0 in | Wt 298.2 lb

## 2018-03-16 DIAGNOSIS — D259 Leiomyoma of uterus, unspecified: Secondary | ICD-10-CM

## 2018-03-16 DIAGNOSIS — N83201 Unspecified ovarian cyst, right side: Secondary | ICD-10-CM

## 2018-03-16 DIAGNOSIS — N83202 Unspecified ovarian cyst, left side: Secondary | ICD-10-CM | POA: Diagnosis not present

## 2018-03-16 DIAGNOSIS — Z79818 Long term (current) use of other agents affecting estrogen receptors and estrogen levels: Secondary | ICD-10-CM

## 2018-03-16 MED ORDER — LEUPROLIDE ACETATE 3.75 MG IM KIT
3.7500 mg | PACK | Freq: Once | INTRAMUSCULAR | Status: AC
  Administered 2018-03-16: 3.75 mg via INTRAMUSCULAR

## 2018-03-16 NOTE — Progress Notes (Signed)
Pt is present today for her 3rd Lupron injection.   BP (!) 155/90   Pulse 73   Ht 5\' 8"  (1.727 m)   Wt 298 lb 3.2 oz (135.3 kg)   BMI 45.34 kg/m

## 2018-03-17 ENCOUNTER — Telehealth: Payer: Self-pay

## 2018-03-17 NOTE — Telephone Encounter (Signed)
Pt called no answer LM via voicemail to call the office to speak more about her Lupron injections.

## 2018-04-05 ENCOUNTER — Telehealth: Payer: Self-pay | Admitting: Obstetrics and Gynecology

## 2018-04-05 NOTE — Telephone Encounter (Signed)
Pt was called back no answer lm via voicemail to call the office.

## 2018-04-05 NOTE — Telephone Encounter (Signed)
The patient called the nurse back, and just asked the nurse to return her call, please advise, thanks.

## 2018-04-06 NOTE — Telephone Encounter (Signed)
Pt was called and pt needed the number to contact [facility] pt was given the number [phone].

## 2018-04-14 ENCOUNTER — Other Ambulatory Visit: Payer: Self-pay

## 2018-04-14 NOTE — Telephone Encounter (Signed)
Pt was called no answer LM via voicemail that her lupron had arrived and asked pt to please call the office to schedule an appt for lupron injection.

## 2018-04-20 ENCOUNTER — Ambulatory Visit (INDEPENDENT_AMBULATORY_CARE_PROVIDER_SITE_OTHER): Payer: Medicaid Other | Admitting: Obstetrics and Gynecology

## 2018-04-20 VITALS — BP 142/89 | HR 72 | Ht 68.0 in | Wt 291.3 lb

## 2018-04-20 DIAGNOSIS — D259 Leiomyoma of uterus, unspecified: Secondary | ICD-10-CM | POA: Diagnosis not present

## 2018-04-20 DIAGNOSIS — Z79818 Long term (current) use of other agents affecting estrogen receptors and estrogen levels: Secondary | ICD-10-CM | POA: Diagnosis not present

## 2018-04-20 MED ORDER — LEUPROLIDE ACETATE 3.75 MG IM KIT
3.7500 mg | PACK | Freq: Once | INTRAMUSCULAR | Status: AC
  Administered 2018-04-20: 3.75 mg via INTRAMUSCULAR

## 2018-04-20 NOTE — Progress Notes (Signed)
Pt present for Lupron 3.75mg  injection. Per pt no c/o. Tolerated injection well. To return in 1 month.  Vitals:  Blood pressure (!) 142/89, pulse 72, height 5\' 8"  (1.727 m), weight 291 lb 5 oz (132.1 kg).

## 2018-04-22 NOTE — Progress Notes (Signed)
I have reviewed the record and concur with patient management and plan.  Bryahna Lesko, MD [facility]     

## 2018-06-01 ENCOUNTER — Telehealth: Payer: Self-pay

## 2018-06-01 NOTE — Telephone Encounter (Signed)
Pt was called and informed that her Lupron had arrived. Pt made an appointment to have the injection done on 06/05/18.

## 2018-06-05 ENCOUNTER — Ambulatory Visit (INDEPENDENT_AMBULATORY_CARE_PROVIDER_SITE_OTHER): Payer: Medicaid Other | Admitting: Obstetrics and Gynecology

## 2018-06-05 VITALS — BP 144/108 | HR 73 | Ht 68.0 in | Wt 286.5 lb

## 2018-06-05 DIAGNOSIS — D219 Benign neoplasm of connective and other soft tissue, unspecified: Secondary | ICD-10-CM | POA: Diagnosis not present

## 2018-06-05 DIAGNOSIS — Z79818 Long term (current) use of other agents affecting estrogen receptors and estrogen levels: Secondary | ICD-10-CM

## 2018-06-05 MED ORDER — LEUPROLIDE ACETATE 3.75 MG IM KIT
3.7500 mg | PACK | Freq: Once | INTRAMUSCULAR | Status: AC
  Administered 2018-06-05: 3.75 mg via INTRAMUSCULAR

## 2018-06-05 NOTE — Progress Notes (Signed)
Pt is present today for Lupron injection. Pt stated that she is having some hot flashes. Pt was informed that was a side effect from the medication.    BP (!) 144/108   Pulse 73   Ht 5\' 8"  (1.727 m)   Wt 286 lb 8 oz (130 kg)   LMP  (LMP Unknown)   BMI 43.56 kg/m

## 2018-06-13 ENCOUNTER — Emergency Department: Payer: Medicaid Other

## 2018-06-13 ENCOUNTER — Emergency Department
Admission: EM | Admit: 2018-06-13 | Discharge: 2018-06-13 | Disposition: A | Discharge status: Home / Self Care | Payer: Medicaid Other | Attending: Emergency Medicine | Admitting: Emergency Medicine

## 2018-06-13 ENCOUNTER — Other Ambulatory Visit: Payer: Self-pay

## 2018-06-13 DIAGNOSIS — Z79899 Other long term (current) drug therapy: Secondary | ICD-10-CM | POA: Diagnosis not present

## 2018-06-13 DIAGNOSIS — Z87891 Personal history of nicotine dependence: Secondary | ICD-10-CM | POA: Diagnosis not present

## 2018-06-13 DIAGNOSIS — I10 Essential (primary) hypertension: Secondary | ICD-10-CM | POA: Diagnosis not present

## 2018-06-13 DIAGNOSIS — R1084 Generalized abdominal pain: Secondary | ICD-10-CM | POA: Diagnosis not present

## 2018-06-13 DIAGNOSIS — J45909 Unspecified asthma, uncomplicated: Secondary | ICD-10-CM | POA: Diagnosis not present

## 2018-06-13 LAB — COMPREHENSIVE METABOLIC PANEL
ALBUMIN: 3.3 g/dL — AB (ref 3.5–5.0)
ALK PHOS: 71 U/L (ref 38–126)
ALT: 19 U/L (ref 0–44)
ANION GAP: 7 (ref 5–15)
AST: 19 U/L (ref 15–41)
BILIRUBIN TOTAL: 0.7 mg/dL (ref 0.3–1.2)
BUN: 8 mg/dL (ref 6–20)
CO2: 30 mmol/L (ref 22–32)
Calcium: 8.6 mg/dL — ABNORMAL LOW (ref 8.9–10.3)
Chloride: 104 mmol/L (ref 98–111)
Creatinine, Ser: 0.7 mg/dL (ref 0.44–1.00)
GFR calc Af Amer: >60 mL/min (ref >60)
GFR calc non Af Amer: >60 mL/min (ref >60)
GLUCOSE: 98 mg/dL (ref 70–99)
POTASSIUM: 3.2 mmol/L — AB (ref 3.5–5.1)
SODIUM: 141 mmol/L (ref 135–145)
TOTAL PROTEIN: 7 g/dL (ref 6.5–8.1)

## 2018-06-13 LAB — URINALYSIS, COMPLETE (UACMP) WITH MICROSCOPIC
BACTERIA UA: NONE SEEN
Bilirubin Urine: NEGATIVE
Glucose, UA: NEGATIVE mg/dL
Hgb urine dipstick: NEGATIVE
Ketones, ur: NEGATIVE mg/dL
Leukocytes, UA: NEGATIVE
Nitrite: NEGATIVE
PROTEIN: NEGATIVE mg/dL
Specific Gravity, Urine: 1.016 (ref 1.005–1.030)
pH: 6 (ref 5.0–8.0)

## 2018-06-13 LAB — CBC
HCT: 37.3 % (ref 36.0–46.0)
HEMOGLOBIN: 12.1 g/dL (ref 12.0–15.0)
MCH: 28.2 pg (ref 26.0–34.0)
MCHC: 32.4 g/dL (ref 30.0–36.0)
MCV: 86.9 fL (ref 80.0–100.0)
Platelets: 355 10*3/uL (ref 150–400)
RBC: 4.29 MIL/uL (ref 3.87–5.11)
RDW: 13.3 % (ref 11.5–15.5)
WBC: 9.5 10*3/uL (ref 4.0–10.5)
nRBC: 0 % (ref 0.0–0.2)

## 2018-06-13 LAB — POCT PREGNANCY, URINE: PREG TEST UR: NEGATIVE

## 2018-06-13 LAB — LIPASE, BLOOD: Lipase: 21 U/L (ref 11–51)

## 2018-06-13 MED ORDER — ALUM & MAG HYDROXIDE-SIMETH 200-200-20 MG/5ML PO SUSP
30.0000 mL | Freq: Once | ORAL | Status: AC
  Administered 2018-06-13: 30 mL via ORAL
  Filled 2018-06-13: qty 30

## 2018-06-13 MED ORDER — HYDROCODONE-ACETAMINOPHEN 5-325 MG PO TABS
2.0000 | ORAL_TABLET | Freq: Once | ORAL | Status: AC
  Administered 2018-06-13: 2 via ORAL
  Filled 2018-06-13: qty 2

## 2018-06-13 MED ORDER — MORPHINE SULFATE (PF) 4 MG/ML IV SOLN
4.0000 mg | Freq: Once | INTRAVENOUS | Status: AC
  Administered 2018-06-13: 4 mg via INTRAVENOUS
  Filled 2018-06-13: qty 1

## 2018-06-13 MED ORDER — SIMETHICONE 80 MG PO CHEW: 80.0000 mg | CHEWABLE_TABLET | Freq: Four times a day (QID) | ORAL | 2 refills | Status: DC | PRN

## 2018-06-13 MED ORDER — IOPAMIDOL (ISOVUE-300) INJECTION 61%
100.0000 mL | Freq: Once | INTRAVENOUS | Status: AC | PRN
  Administered 2018-06-13: 125 mL via INTRAVENOUS
  Filled 2018-06-13: qty 100

## 2018-06-13 MED ORDER — IOPAMIDOL (ISOVUE-300) INJECTION 61%
30.0000 mL | Freq: Once | INTRAVENOUS | Status: AC
  Administered 2018-06-13: 30 mL via ORAL
  Filled 2018-06-13: qty 30

## 2018-06-13 MED ORDER — ONDANSETRON HCL 4 MG/2ML IJ SOLN
4.0000 mg | Freq: Once | INTRAMUSCULAR | Status: AC
  Administered 2018-06-13: 4 mg via INTRAVENOUS
  Filled 2018-06-13: qty 2

## 2018-06-13 MED ORDER — HYDROCODONE-ACETAMINOPHEN 5-325 MG PO TABS: 1.0000 | ORAL_TABLET | ORAL | 0 refills | Status: DC | PRN

## 2018-06-13 NOTE — ED Notes (Signed)
Pt ambulatory to the toilet by self. Finished oral contrast, CT notified.

## 2018-06-13 NOTE — ED Triage Notes (Signed)
States R shoulder and neck pain and generalized R sided abd pain that began yesterday. Still has belly organs. States hx hernia. Denies urinary symptoms. Denies N&V&D&fever. A&O, ambulatory.

## 2018-06-13 NOTE — ED Provider Notes (Addendum)
[facility] Emergency Department Provider Note  Time seen: 7:52 PM  I have reviewed the triage vital signs and the nursing notes.   HISTORY  Chief Complaint Abdominal Pain and Shoulder Pain    HPI Terri Chambers is a 42 y.o. female with a past medical history of asthma, depression, hypertension, presents to the emergency department for abdominal pain.  According to the patient her abdomen started hurting last night and now she is experiencing generalized abdominal pain throughout her entire abdomen.  Is also complaining of some right shoulder pain but refers that the pain in her abdomen.  Patient states she is only been able to eat very little today because of her abdominal discomfort although denies any vomiting.  Denies any diarrhea, states she had a small bowel movement yesterday but none today.  States it feels like she is full of gas but cannot get any gas out today.  Describes her pain as a 7/10 diffuse pressure type pain across the entire abdomen.   Past Medical History:  Diagnosis Date  . Asthma   . Depression   . History of asthma   . Hypertension   . Migraine headache   . Myasthenia gravis   . Obesity     Patient Active Problem List   Diagnosis Date Noted  . Essential hypertension 11/05/2017  . Morbid obesity ([place]) 11/05/2017  . Umbilical hernia without obstruction and without gangrene 11/05/2017  . Menorrhagia with regular cycle 11/05/2017  . Right ovarian cyst 11/05/2017  . Uterine leiomyoma 11/05/2017  . Encounter for therapeutic drug monitoring 10/02/2012  . Myasthenia gravis without exacerbation ([place]) 10/02/2012  . Myasthenia gravis with exacerbation ([place]) 10/02/2012    Past Surgical History:  Procedure Laterality Date  . EAR TUBE REMOVAL      Prior to Admission medications   Medication Sig Start Date End Date Taking? Authorizing Provider  atenolol (TENORMIN) 50 MG tablet Take by mouth.    [provider]  ketoconazole  (NIZORAL) 2 % shampoo Apply 1 application topically daily. AS DIRECTED 01/26/17   [provider]  leuprolide (LUPRON) 3.75 MG injection Inject 3.75 mg into the muscle once.    [provider]  lisinopril (PRINIVIL,ZESTRIL) 20 MG tablet Take 20 mg by mouth daily.    [provider]    No Known Allergies  Family History  Problem Relation Age of Onset  . Diabetes Mother   . Arthritis Father   . Hypertension Father   . Cerebral aneurysm Maternal Grandmother   . Congestive Heart Failure Paternal Grandmother   . Cervical cancer Other        Maternal great-grandmother died of cervical cancer  . Other Neg Hx     Social History Social History   Tobacco Use  . Smoking status: Former Smoker    Last attempt to quit: 08/02/2008    Years since quitting: 9.8  . Smokeless tobacco: Never Used  Substance Use Topics  . Alcohol use: No  . Drug use: No    Review of Systems Constitutional: Negative for fever. Cardiovascular: Negative for chest pain. Respiratory: Negative for shortness of breath. Gastrointestinal: Positive for diffuse abdominal pain since last night. Musculoskeletal: Negative for musculoskeletal complaints Skin: Negative for skin complaints  Neurological: Negative for headache All other ROS negative  ____________________________________________   PHYSICAL EXAM:  VITAL SIGNS: ED Triage Vitals  Enc Vitals Group     BP 06/13/18 1830 (!) 184/107     Pulse Rate 06/13/18 1830 87  Resp 06/13/18 1830 18     Temp 06/13/18 1830 98.8 F (37.1 C)     Temp Source 06/13/18 1830 Oral     SpO2 06/13/18 1830 96 %     Weight 06/13/18 1830 286 lb (129.7 kg)     Height 06/13/18 1830 5\' 8"  (1.727 m)     Head Circumference --      Peak Flow --      Pain Score 06/13/18 1833 7     Pain Loc --      Pain Edu? --      Excl. in [place]? --    Constitutional: Alert and oriented. Well appearing and in no distress. Eyes: Normal exam ENT   Head: Normocephalic  and atraumatic.   Mouth/Throat: Mucous membranes are moist. Cardiovascular: Normal rate, regular rhythm. No murmur Respiratory: Normal respiratory effort without tachypnea nor retractions. Breath sounds are clear Gastrointestinal: Mild distention, moderate tenderness throughout the entire abdomen.  No obvious rebound or guarding. Musculoskeletal: Nontender with normal range of motion in all extremities.  Great range of motion in the right shoulder without pain. Neurologic:  Normal speech and language. No gross focal neurologic deficits  Skin:  Skin is warm, dry and intact.  Psychiatric: Mood and affect are normal.   ____________________________________________    RADIOLOGY IMPRESSION: 1. No definite explanation for patient's abdominal pain. Specifically, no evidence of enteric or urinary obstruction. 2. Unchanged size of known 6.2 cm macroscopic fat containing right-sided ovarian dermoid, similar to abdominal CT performed 09/2017, however there has been interval development of right pelvic sidewall lymphadenopathy. If not previously performed, nonemergent gynecologic referral is advised. 3. Re-demonstrated myomatous uterus including suspected approximately 8.3 cm right-sided broad ligament fibroid. 4. Scattered colonic diverticulosis without evidence of diverticulitis.  ____________________________________________   INITIAL IMPRESSION / ASSESSMENT AND PLAN / ED COURSE  Pertinent labs & imaging results that were available during my care of the patient were reviewed by me and considered in my medical decision making (see chart for details).  Patient presents to the emergency department for abdominal pain since last night which has become diffuse and moderate.  Also states intermittent right shoulder pain especially when her abdominal pain worsens or she moves in certain directions.  Differential would include gallbladder disease, small bowel obstruction, colitis or diverticulitis.   Patient's labs are largely within normal limits including a normal white blood cell count normal LFTs, normal lipase.  Urinalysis is negative.  Patient has moderate diffuse tenderness to palpation with mild distention and tympanic percussion which would be concerning for possible small bowel obstruction.  We will obtain CT imaging to further evaluate.  Patient agreeable to plan of care.  CT scan is essentially negative besides a dermoid cyst.  Patient states she is Artie following up with OB/GYN regarding this as well as her fibroids.  We will discharge patient with pain medication and simethicone.  I discussed return precautions for any worsening pain or development of fever.  Patient is agreeable to this plan of care.  EKG reviewed and interpreted by myself shows normal sinus rhythm 82 bpm with a narrow QRS, normal axis, normal intervals, no concerning ST changes.  ____________________________________________   FINAL CLINICAL IMPRESSION(S) / ED DIAGNOSES  Abdominal pain    Harvest Dark, MD 06/13/18 2154    Harvest Dark, MD 06/13/18 [id number]    Harvest Dark, MD 07/01/18 [phone]

## 2018-07-04 ENCOUNTER — Encounter: Payer: Medicaid Other | Admitting: Obstetrics and Gynecology

## 2018-07-07 ENCOUNTER — Encounter: Payer: Medicaid Other | Admitting: Obstetrics and Gynecology

## 2018-07-13 ENCOUNTER — Ambulatory Visit (INDEPENDENT_AMBULATORY_CARE_PROVIDER_SITE_OTHER): Payer: Medicaid Other

## 2018-07-13 DIAGNOSIS — D259 Leiomyoma of uterus, unspecified: Secondary | ICD-10-CM | POA: Diagnosis not present

## 2018-07-13 DIAGNOSIS — N83201 Unspecified ovarian cyst, right side: Secondary | ICD-10-CM

## 2018-07-17 ENCOUNTER — Telehealth: Payer: Self-pay

## 2018-07-17 NOTE — Telephone Encounter (Signed)
Called [facility] to see where the pt's medication was due to the medication was scheduled for delivery 07/14/18 and never arrived. Was the phone for approx: 30 minutes due to operator calling and contacting others to see about the pt's delivery of medication. Was informed that pharmacy is currently out of Lupron and was advised to call back tomorrow to speak to someone to schedule a shipping of the pt's medication.

## 2018-07-19 ENCOUNTER — Encounter: Payer: Medicaid Other | Admitting: Obstetrics and Gynecology

## 2018-07-19 ENCOUNTER — Telehealth: Payer: Self-pay

## 2018-07-19 ENCOUNTER — Other Ambulatory Visit: Payer: Medicaid Other

## 2018-07-19 NOTE — Telephone Encounter (Signed)
Call the pharmacy [facility] stated that the medication Lupron is out of stock.

## 2018-07-19 NOTE — Telephone Encounter (Signed)
Pt was called and informed her that the pharmacy stated that her Lupron is out of stock. Pt seem to be very upset stating that she was really tired of the pharmacy company with the [place]. Pt stated that she would keep her appointment tomorrow with [facility] to decision surgery options.

## 2018-07-20 ENCOUNTER — Ambulatory Visit (INDEPENDENT_AMBULATORY_CARE_PROVIDER_SITE_OTHER): Payer: Medicaid Other | Admitting: Obstetrics and Gynecology

## 2018-07-20 ENCOUNTER — Encounter: Payer: Self-pay | Admitting: Obstetrics and Gynecology

## 2018-07-20 VITALS — BP 167/114 | HR 76 | Ht 68.0 in | Wt 289.1 lb

## 2018-07-20 DIAGNOSIS — D251 Intramural leiomyoma of uterus: Secondary | ICD-10-CM

## 2018-07-20 DIAGNOSIS — K429 Umbilical hernia without obstruction or gangrene: Secondary | ICD-10-CM | POA: Diagnosis not present

## 2018-07-20 DIAGNOSIS — D252 Subserosal leiomyoma of uterus: Secondary | ICD-10-CM

## 2018-07-20 DIAGNOSIS — N938 Other specified abnormal uterine and vaginal bleeding: Secondary | ICD-10-CM | POA: Diagnosis not present

## 2018-07-20 DIAGNOSIS — N83201 Unspecified ovarian cyst, right side: Secondary | ICD-10-CM

## 2018-07-20 DIAGNOSIS — I10 Essential (primary) hypertension: Secondary | ICD-10-CM | POA: Diagnosis not present

## 2018-07-20 NOTE — Progress Notes (Signed)
GYNECOLOGY PROGRESS NOTE  Subjective:    Patient ID: Terri Chambers, female    DOB: 1976-01-07, 42 y.o.   MRN: [id number]  HPI  Patient is a 42 y.o. G61P1001 female who presents for discussion of ultrasound results.  She is currently on Lupron therapy for enlarged fibroid uterus and heavy menstrual bleeding.  She has completed 5 of the 6 injections. She reports that she is now considering surgery for management of her fibroids as she does not feel that the Lupron has helped much to shrink the size of her fibroids. She does report that she did not have a cycle last month due to the Lupron, which was helpful. She is also tired of dealing with the company that provides Lupron as it is a hassle each month to get her medication in a timely fashion. Lastly, she notes that she was seen in the ER last month due to significant pain, was told that it may be due not only to her cyst but her large ovarian cyst  The following portions of the patient's history were reviewed and updated as appropriate: allergies, current medications, past family history, past medical history, past social history, past surgical history and problem list.  Review of Systems Pertinent items noted in HPI and remainder of comprehensive ROS otherwise negative.   Objective:   Blood pressure (!) 167/114, pulse 76, height 5\' 8"  (1.727 m), weight 289 lb 1.6 oz (131.1 kg). Body mass index is 43.96 kg/m.  General appearance: alert and no distress Abdomen: soft, mildly tender in lowe pelvis.  Mass (uterus with fibroids) palpable up to ~ 3-4 cm below umbilicus. Small umbilical hernia present, non-strangulated.  Pelvis: deferred.    Imaging:  US Transvaginal Non-OB Patient Name: Terri Chambers DOB: [birth year] MRN: [id number]  ULTRASOUND REPORT  Location: [facility]  Date of Service: 07/13/2018   Indications:Enlarged Uterus/uterine fibroids Findings:  The uterus is anteverted and measures 9.3x7.4x.4.4cm Echo texture is  heterogenous with evidence of focal masses. Within the uterus are multiple suspected fibroids measuring: Fibroid 1:SS vs pedunculated ,On the Lt uterine wall,near the fundus =  4.8x6x4.4cm Fibroid 2:SS, Lt ut wall, anterior = 2x1.6x3.2cm Fibroid 3: IM vs SM, posterior ut wall near LUS = 1.7x1.3cm FB 4: SS vs pedunculated, Rt ut wall = 7.4x6.1cm  The Endometrium measures 7.1 mm.  Right Ovary not seen d/t filled bowel and big ut fibroids Left Ovary measures 4.1 cm3. It is normal in appearance. Survey of the adnexa demonstrates no adnexal masses. There is no free fluid in the cul de sac.  Impression: 1. Within the uterus are multiple suspected fibroids the fibroid that are  clear to see are measuring: Fibroid 1:SS vs pedunculated ,On the Lt uterine wall,near the fundus =  4.8x6x4.4cm Fibroid 2:SS, Lt ut wall, anterior = 2x1.6x3.2cm Fibroid 3: IM vs SM, posterior ut wall near LUS = 1.7x1.3cm FB 4: SS vs pedunculated, Rt ut wall = 7.4x6.1cm  Recommendations: 1.Clinical correlation with the patient's History and Physical Exam.  Abeer Alsammarraie,RDMS  ,I have reviewed this study and agree with documented findings.   Rubie Maid, MD [facility]     CLINICAL DATA:  Generalized right-sided abdominal pain beginning yesterday.  EXAM: CT ABDOMEN AND PELVIS WITH CONTRAST (performed 06/13/2018)  TECHNIQUE: Multidetector CT imaging of the abdomen and pelvis was performed using the standard protocol following bolus administration of intravenous contrast.  CONTRAST:  136mL ISOVUE-300 IOPAMIDOL (ISOVUE-300) INJECTION 61%  COMPARISON:  CT abdomen and pelvis -  03/07/2018; 09/20/2017  FINDINGS: Lower chest: Limited visualization of the lower thorax demonstrates minimal dependent subpleural ground-glass atelectasis within the image right lower lobe. No discrete focal airspace opacities. No pleural effusion  Borderline cardiomegaly.  No pericardial  effusion.  Hepatobiliary: Hepatomegaly. Normal hepatic contour. No discrete hepatic lesions. Normal appearance of the gallbladder given degree distention. No radiopaque gallstones. No intra or extrahepatic biliary ductal dilatation. No ascites.  Pancreas: Normal appearance the pancreas  Spleen: Normal appearance of the spleen  Adrenals/Urinary Tract: There is symmetric enhancement and excretion of the bilateral kidneys. No definite renal stones on this postcontrast examination. No discrete renal lesions. No urine obstruction or perinephric stranding.  Normal appearance of the bilateral adrenal glands.  Normal appearance of the urinary bladder given degree of distention.  Stomach/Bowel: Scattered colonic diverticulosis without evidence of superimposed acute diverticulitis. Normal appearance of the terminal ileum and appendix. No pneumoperitoneum, pneumatosis or portal venous gas.  Vascular/Lymphatic: Normal caliber the abdominal aorta. The major branch vessels of the abdominal aorta appear patent on this non CTA examination. Interval development of right pelvic sidewall lymphadenopathy with index right pelvic sidewall lymph node measuring 1 cm in greatest short axis diameter (image 77, series 2), previously, 0.7 cm. Adjacent pelvic sidewall lymph node now measures approximately 1.1 cm (image 79) and right external iliac chain lymph node measuring 1.4 cm (image 77, series 2). Scattered retroperitoneal lymph nodes are numerous though individually not enlarged by size criteria.  Reproductive: Re demonstrated approximately 6.2 x 5.5 cm fat containing right-sided ovarian dermoid, similar to abdominal CT performed 09/20/2017, previously, 6.7 x 6.0 cm. Myomatous uterus including approximately 7.9 x 5.1 x 8.3 cm right-sided suspected broad ligament fibroid (axial image 67, series 2; coronal image 44, series 5). No discrete left-sided adnexal lesion. No free fluid the pelvic  cul-de-sac.  Other: Small mesenteric fat containing periumbilical hernia. Minimal subcutaneous edema about the midline low back pain.  Musculoskeletal: No acute or aggressive osseous abnormalities. 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  IMPRESSION: 1. No definite explanation for patient's abdominal pain. Specifically, no evidence of enteric or urinary obstruction. 2. Unchanged size of known 6.2 cm macroscopic fat containing right-sided ovarian dermoid, similar to abdominal CT performed 09/2017, however there has been interval development of right pelvic sidewall lymphadenopathy. If not previously performed, nonemergent gynecologic referral is advised. 3. Re-demonstrated myomatous uterus including suspected approximately 8.3 cm right-sided broad ligament fibroid. 4. Scattered colonic diverticulosis without evidence of diverticulitis.   Electronically Signed   By: Sandi Mariscal M.D.   On: 06/13/2018 21:32  Assessment:   Enlarged uterine fibroids  DUB Morbid obesity Uncontrolled HTN Umbilical hernia Right ovarian cyst  Plan:  - Patient desires surgical management with myomectomy.  The risks of surgery were discussed in detail with the patient including but not limited to: bleeding which may require transfusion or reoperation; infection which may require prolonged hospitalization or re-hospitalization and antibiotic therapy; injury to bowel, bladder, ureters and major vessels or other surrounding organs; need for additional procedures including hysterectomy; thromboembolic phenomenon, incisional problems and other postoperative or anesthesia complications.  Patient was told that the likelihood that her condition and symptoms will be treated effectively with this surgical management was very high; the postoperative expectations were also discussed in detail. The patient also understands the alternative treatment options which  were discussed in full. All questions were answered.  She was told that she will be contacted by our surgical scheduler regarding the time  and date of her surgery; routine preoperative instructions of having nothing to eat or drink after midnight on the day prior to surgery and also coming to the hospital 1.5 hours prior to her time of surgery were also emphasized.  She was told she may be called for a preoperative appointment about a week prior to surgery and will be given further preoperative instructions at that visit. Printed patient education handouts about the procedure were given to the patient to review at home. To be scheduled for abdominal myomectomy on 08/21/2017.  - DUB - discussed that patient has the option of receiving final Lupron dose to continue amenorrhea until time of surgery. Patient ok to receive. Still waiting on medication from distributor.  - Uncontrolled HTN. PCP just increased BP meds which made BP higher than her norm. Is planning to see her PCP again by next week.  - Umbilical hernia - patient questions if she can have the hernia repair at the same time.  Will refer to General Surgery.  - Right ovarian cyst - dermoid cyst, has remained unchanged since last imaging, however now noting some lymphadenopathy.  Discussed need for removal at time of myomectomy. Will also order ROMA score to assess malignant potential.    A total of 25 minutes were spent face-to-face with the patient during this encounter and over half of that time involved counseling and coordination of care.   Rubie Maid, MD [facility]

## 2018-07-20 NOTE — Progress Notes (Signed)
Pt stated that she wanted to discuss surgery for her fibroids.

## 2018-07-31 ENCOUNTER — Encounter: Payer: Self-pay | Admitting: *Deleted

## 2018-07-31 ENCOUNTER — Encounter: Payer: Self-pay | Admitting: General Surgery

## 2018-07-31 ENCOUNTER — Ambulatory Visit (INDEPENDENT_AMBULATORY_CARE_PROVIDER_SITE_OTHER): Payer: Medicaid Other | Admitting: General Surgery

## 2018-07-31 ENCOUNTER — Other Ambulatory Visit: Payer: Self-pay

## 2018-07-31 VITALS — BP 162/95 | HR 71 | Temp 97.7°F | Resp 16 | Ht 68.0 in | Wt 289.8 lb

## 2018-07-31 DIAGNOSIS — K429 Umbilical hernia without obstruction or gangrene: Secondary | ICD-10-CM

## 2018-07-31 NOTE — Progress Notes (Signed)
Patient was seen in the office by Dr.Cannon today.  Dr. Celine Ahr will contact [place] regarding the scheduling surgery which will be coordinated when both MD's are available.   Dr. Celine Ahr will be in touch with the patient after she speaks with Dr. Marcelline Mates.   Patient verbalizes understanding.

## 2018-07-31 NOTE — Patient Instructions (Addendum)
Dr.Cannon will contact Dr.Cherry regarding the scheduling the two surgeries and will be in touch with you.         Umbilical Hernia, Adult  A hernia is a bulge of tissue that pushes through an opening between muscles. An umbilical hernia happens in the abdomen, near the belly button (umbilicus). The hernia may contain tissues from the small intestine, large intestine, or fatty tissue covering the intestines (omentum). Umbilical hernias in adults tend to get worse over time, and they require surgical treatment. There are several types of umbilical hernias. You may have:  A hernia located just above or below the umbilicus (indirect hernia). This is the most common type of umbilical hernia in adults.  A hernia that forms through an opening formed by the umbilicus (direct hernia).  A hernia that comes and goes (reducible hernia). A reducible hernia may be visible only when you strain, lift something heavy, or cough. This type of hernia can be pushed back into the abdomen (reduced).  A hernia that traps abdominal tissue inside the hernia (incarcerated hernia). This type of hernia cannot be reduced.  A hernia that cuts off blood flow to the tissues inside the hernia (strangulated hernia). The tissues can start to die if this happens. This type of hernia requires emergency treatment. What are the causes? An umbilical hernia happens when tissue inside the abdomen presses on a weak area of the abdominal muscles. What increases the risk? You may have a greater risk of this condition if you:  Are obese.  Have had several pregnancies.  Have a buildup of fluid inside your abdomen (ascites).  Have had surgery that weakens the abdominal muscles. What are the signs or symptoms? The main symptom of this condition is a painless bulge at or near the belly button. A reducible hernia may be visible only when you strain, lift something heavy, or cough. Other symptoms may include:  Dull pain.  A  feeling of pressure. Symptoms of a strangulated hernia may include:  Pain that gets increasingly worse.  Nausea and vomiting.  Pain when pressing on the hernia.  Skin over the hernia becoming red or purple.  Constipation.  Blood in the stool. How is this diagnosed? This condition may be diagnosed based on:  A physical exam. You may be asked to cough or strain while standing. These actions increase the pressure inside your abdomen and force the hernia through the opening in your muscles. Your health care provider may try to reduce the hernia by pressing on it.  Your symptoms and medical history. How is this treated? Surgery is the only treatment for an umbilical hernia. Surgery for a strangulated hernia is done as soon as possible. If you have a small hernia that is not incarcerated, you may need to lose weight before having surgery. Follow these instructions at home:  Lose weight, if told by your health care provider.  Do not try to push the hernia back in.  Watch your hernia for any changes in color or size. Tell your health care provider if any changes occur.  You may need to avoid activities that increase pressure on your hernia.  Do not lift anything that is heavier than 10 lb (4.5 kg) until your health care provider says that this is safe.  Take over-the-counter and prescription medicines only as told by your health care provider.  Keep all follow-up visits as told by your health care provider. This is important. Contact a health care provider if:  Your  hernia gets larger.  Your hernia becomes painful. Get help right away if:  You develop sudden, severe pain near the area of your hernia.  You have pain as well as nausea or vomiting.  You have pain and the skin over your hernia changes color.  You develop a fever. This information is not intended to replace advice given to you by your health care provider. Make sure you discuss any questions you have with your  health care provider. Document Released: 12/19/2015 Document Revised: 08/31/2017 Document Reviewed: 01/17/2017 Elsevier Interactive Patient Education  2019 [employer].

## 2018-07-31 NOTE — Progress Notes (Signed)
Patient ID: Terri Chambers, female   DOB: [birth year], 42 y.o.   MRN: [id number]  Chief Complaint  Patient presents with  . New Patient (Initial Visit)    Umbilical Hernia    HPI Terri Chambers is a 42 y.o. female.   She was referred by Dr. Marcelline Mates for evaluation of an umbilical hernia.  Terri Chambers was seen in the emergency department in November with abdominal pain.  Imaging demonstrated uterine fibroids, a dermoid cyst of the ovary, and an umbilical hernia.  She is seeing  Dr. Marcelline Mates for management of the fibroids.  She asked whether or not her umbilical hernia could be addressed at the same time.  She states that she noticed a hernia in her bellybutton around June 2019.  She was performing sit ups at that time.  She says she is also had some abdominal bloating but no nausea, vomiting, fevers, or chills.  She has never been obstipated.  She has never had any significant pain referrable to her umbilicus.   Past Medical History:  Diagnosis Date  . Asthma   . Depression   . History of asthma   . Hypertension   . Migraine headache   . Myasthenia gravis   . Obesity     Past Surgical History:  Procedure Laterality Date  . EAR TUBE REMOVAL      Family History  Problem Relation Age of Onset  . Diabetes Mother   . Arthritis Father   . Hypertension Father   . Cerebral aneurysm Maternal Grandmother   . Congestive Heart Failure Paternal Grandmother   . Cervical cancer Other        Maternal great-grandmother died of cervical cancer  . Other Neg Hx     Social History Social History   Tobacco Use  . Smoking status: Former Smoker    Last attempt to quit: 2004    Years since quitting: 16.0  . Smokeless tobacco: Never Used  Substance Use Topics  . Alcohol use: No  . Drug use: No    No Known Allergies  Current Outpatient Medications  Medication Sig Dispense Refill  . atenolol (TENORMIN) 100 MG tablet Take 100 mg by mouth daily.    Marland Kitchen ketoconazole (NIZORAL) 2 % shampoo Apply 1  application topically daily. AS DIRECTED    . leuprolide (LUPRON) 3.75 MG injection Inject 3.75 mg into the muscle once.    Marland Kitchen lisinopril (PRINIVIL,ZESTRIL) 20 MG tablet Take 20 mg by mouth daily.     No current facility-administered medications for this visit.     Review of Systems Review of Systems  All other systems reviewed and are negative.   Blood pressure (!) 162/95, pulse 71, temperature 97.7 F (36.5 C), temperature source Tympanic, resp. rate 16, height 5\' 8"  (1.727 m), weight 289 lb 12.8 oz (131.5 kg), SpO2 97 %.  Physical Exam Physical Exam Vitals signs reviewed. Exam conducted with a chaperone present.  Constitutional:      Appearance: She is obese.  HENT:     Head: Normocephalic and atraumatic.     Nose: Nose normal. No rhinorrhea.     Mouth/Throat:     Mouth: Mucous membranes are moist.     Pharynx: No oropharyngeal exudate or posterior oropharyngeal erythema.  Eyes:     General: No scleral icterus.    Conjunctiva/sclera: Conjunctivae normal.     Pupils: Pupils are equal, round, and reactive to light.  Neck:     Musculoskeletal: Normal range of motion and neck  supple.     Comments: Fullness in the left lobe of the thyroid without discrete mass. Cardiovascular:     Rate and Rhythm: Normal rate and regular rhythm.     Heart sounds: Normal heart sounds.  Pulmonary:     Effort: Pulmonary effort is normal.     Comments: Distant breath sounds  Abdominal:     General: Bowel sounds are normal.     Palpations: Abdomen is soft.     Hernia: A hernia is present.     Comments: Morbidly obese with BMI of 44  Umbilical hernia is easily reducible.  Musculoskeletal: Normal range of motion.  Lymphadenopathy:     Cervical: No cervical adenopathy.  Skin:    General: Skin is warm and dry.     Findings: No rash.  Neurological:     General: No focal deficit present.     Mental Status: She is alert and oriented to person, place, and time.  Psychiatric:        Mood and  Affect: Mood normal.        Behavior: Behavior normal.        Thought Content: Thought content normal.     Data Reviewed CLINICAL DATA:  Generalized right-sided abdominal pain beginning yesterday.  EXAM: CT ABDOMEN AND PELVIS WITH CONTRAST  TECHNIQUE: Multidetector CT imaging of the abdomen and pelvis was performed using the standard protocol following bolus administration of intravenous contrast.  CONTRAST:  111mL ISOVUE-300 IOPAMIDOL (ISOVUE-300) INJECTION 61%  COMPARISON:  CT abdomen and pelvis - 03/07/2018; 09/20/2017  FINDINGS: Lower chest: Limited visualization of the lower thorax demonstrates minimal dependent subpleural ground-glass atelectasis within the image right lower lobe. No discrete focal airspace opacities. No pleural effusion  Borderline cardiomegaly.  No pericardial effusion.  Hepatobiliary: Hepatomegaly. Normal hepatic contour. No discrete hepatic lesions. Normal appearance of the gallbladder given degree distention. No radiopaque gallstones. No intra or extrahepatic biliary ductal dilatation. No ascites.  Pancreas: Normal appearance the pancreas  Spleen: Normal appearance of the spleen  Adrenals/Urinary Tract: There is symmetric enhancement and excretion of the bilateral kidneys. No definite renal stones on this postcontrast examination. No discrete renal lesions. No urine obstruction or perinephric stranding.  Normal appearance of the bilateral adrenal glands.  Normal appearance of the urinary bladder given degree of distention.  Stomach/Bowel: Scattered colonic diverticulosis without evidence of superimposed acute diverticulitis. Normal appearance of the terminal ileum and appendix. No pneumoperitoneum, pneumatosis or portal venous gas.  Vascular/Lymphatic: Normal caliber the abdominal aorta. The major branch vessels of the abdominal aorta appear patent on this non CTA examination. Interval development of right pelvic  sidewall lymphadenopathy with index right pelvic sidewall lymph node measuring 1 cm in greatest short axis diameter (image 77, series 2), previously, 0.7 cm. Adjacent pelvic sidewall lymph node now measures approximately 1.1 cm (image 79) and right external iliac chain lymph node measuring 1.4 cm (image 77, series 2). Scattered retroperitoneal lymph nodes are numerous though individually not enlarged by size criteria.  Reproductive: Re demonstrated approximately 6.2 x 5.5 cm fat containing right-sided ovarian dermoid, similar to abdominal CT performed 09/20/2017, previously, 6.7 x 6.0 cm. Myomatous uterus including approximately 7.9 x 5.1 x 8.3 cm right-sided suspected broad ligament fibroid (axial image 67, series 2; coronal image 44, series 5). No discrete left-sided adnexal lesion. No free fluid the pelvic cul-de-sac.  Other: Small mesenteric fat containing periumbilical hernia. Minimal subcutaneous edema about the midline low back pain.  Musculoskeletal: No acute or aggressive osseous abnormalities. 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  IMPRESSION: 1. No definite explanation for patient's abdominal pain. Specifically, no evidence of enteric or urinary obstruction. 2. Unchanged size of known 6.2 cm macroscopic fat containing right-sided ovarian dermoid, similar to abdominal CT performed 09/2017, however there has been interval development of right pelvic sidewall lymphadenopathy. If not previously performed, nonemergent gynecologic referral is advised. 3. Re-demonstrated myomatous uterus including suspected approximately 8.3 cm right-sided broad ligament fibroid. 4. Scattered colonic diverticulosis without evidence of Diverticulitis.    Assessment    Umbilical hernia.  Not incarcerated and unlikely to be the cause of patient's discomfort. Would normally not repair unless she became symptomatic, however, if she is  undergoing another operation wherein it would be feasible to repair the hernia, we could certainly do so.    Plan    Will contact Dr. Marcelline Mates regarding her plan for surgery.  If she plans to perform this either robotically or laparoscopically, it may be better to have 1 of my colleagues perform the umbilical hernia repair, as I do not perform laparoscopic hernia repairs.  If the operation is planned to be open, then certainly the hernia could be repaired as part of closure of the midline incision.  I will await a response from Dr. Marcelline Mates and then we will make plans accordingly.       Fredirick Maudlin 07/31/2018, 12:22 PM

## 2018-07-31 NOTE — H&P (View-Only) (Signed)
Patient ID: Terri Chambers, female   DOB: [birth year], 42 y.o.   MRN: [id number]  Chief Complaint  Patient presents with  . New Patient (Initial Visit)    Umbilical Hernia    HPI Terri Chambers is a 42 y.o. female.   She was referred by Terri Chambers for evaluation of an umbilical hernia.  Terri Chambers was seen in the emergency department in November with abdominal pain.  Imaging demonstrated uterine fibroids, a dermoid cyst of the ovary, and an umbilical hernia.  She is seeing  Terri Chambers for management of the fibroids.  She asked whether or not her umbilical hernia could be addressed at the same time.  She states that she noticed a hernia in her bellybutton around June 2019.  She was performing sit ups at that time.  She says she is also had some abdominal bloating but no nausea, vomiting, fevers, or chills.  She has never been obstipated.  She has never had any significant pain referrable to her umbilicus.   Past Medical History:  Diagnosis Date  . Asthma   . Depression   . History of asthma   . Hypertension   . Migraine headache   . Myasthenia gravis   . Obesity     Past Surgical History:  Procedure Laterality Date  . EAR TUBE REMOVAL      Family History  Problem Relation Age of Onset  . Diabetes Mother   . Arthritis Father   . Hypertension Father   . Cerebral aneurysm Maternal Grandmother   . Congestive Heart Failure Paternal Grandmother   . Cervical cancer Other        Maternal great-grandmother died of cervical cancer  . Other Neg Hx     Social History Social History   Tobacco Use  . Smoking status: Former Smoker    Last attempt to quit: 2004    Years since quitting: 16.0  . Smokeless tobacco: Never Used  Substance Use Topics  . Alcohol use: No  . Drug use: No    No Known Allergies  Current Outpatient Medications  Medication Sig Dispense Refill  . atenolol (TENORMIN) 100 MG tablet Take 100 mg by mouth daily.    Marland Kitchen ketoconazole (NIZORAL) 2 % shampoo Apply 1  application topically daily. AS DIRECTED    . leuprolide (LUPRON) 3.75 MG injection Inject 3.75 mg into the muscle once.    Marland Kitchen lisinopril (PRINIVIL,ZESTRIL) 20 MG tablet Take 20 mg by mouth daily.     No current facility-administered medications for this visit.     Review of Systems Review of Systems  All other systems reviewed and are negative.   Blood pressure (!) 162/95, pulse 71, temperature 97.7 F (36.5 C), temperature source Tympanic, resp. rate 16, height 5\' 8"  (1.727 m), weight 289 lb 12.8 oz (131.5 kg), SpO2 97 %.  Physical Exam Physical Exam Vitals signs reviewed. Exam conducted with a chaperone present.  Constitutional:      Appearance: She is obese.  HENT:     Head: Normocephalic and atraumatic.     Nose: Nose normal. No rhinorrhea.     Mouth/Throat:     Mouth: Mucous membranes are moist.     Pharynx: No oropharyngeal exudate or posterior oropharyngeal erythema.  Eyes:     General: No scleral icterus.    Conjunctiva/sclera: Conjunctivae normal.     Pupils: Pupils are equal, round, and reactive to light.  Neck:     Musculoskeletal: Normal range of motion and neck  supple.     Comments: Fullness in the left lobe of the thyroid without discrete mass. Cardiovascular:     Rate and Rhythm: Normal rate and regular rhythm.     Heart sounds: Normal heart sounds.  Pulmonary:     Effort: Pulmonary effort is normal.     Comments: Distant breath sounds  Abdominal:     General: Bowel sounds are normal.     Palpations: Abdomen is soft.     Hernia: A hernia is present.     Comments: Morbidly obese with BMI of 44  Umbilical hernia is easily reducible.  Musculoskeletal: Normal range of motion.  Lymphadenopathy:     Cervical: No cervical adenopathy.  Skin:    General: Skin is warm and dry.     Findings: No rash.  Neurological:     General: No focal deficit present.     Mental Status: She is alert and oriented to person, place, and time.  Psychiatric:        Mood and  Affect: Mood normal.        Behavior: Behavior normal.        Thought Content: Thought content normal.     Data Reviewed CLINICAL DATA:  Generalized right-sided abdominal pain beginning yesterday.  EXAM: CT ABDOMEN AND PELVIS WITH CONTRAST  TECHNIQUE: Multidetector CT imaging of the abdomen and pelvis was performed using the standard protocol following bolus administration of intravenous contrast.  CONTRAST:  156mL ISOVUE-300 IOPAMIDOL (ISOVUE-300) INJECTION 61%  COMPARISON:  CT abdomen and pelvis - 03/07/2018; 09/20/2017  FINDINGS: Lower chest: Limited visualization of the lower thorax demonstrates minimal dependent subpleural ground-glass atelectasis within the image right lower lobe. No discrete focal airspace opacities. No pleural effusion  Borderline cardiomegaly.  No pericardial effusion.  Hepatobiliary: Hepatomegaly. Normal hepatic contour. No discrete hepatic lesions. Normal appearance of the gallbladder given degree distention. No radiopaque gallstones. No intra or extrahepatic biliary ductal dilatation. No ascites.  Pancreas: Normal appearance the pancreas  Spleen: Normal appearance of the spleen  Adrenals/Urinary Tract: There is symmetric enhancement and excretion of the bilateral kidneys. No definite renal stones on this postcontrast examination. No discrete renal lesions. No urine obstruction or perinephric stranding.  Normal appearance of the bilateral adrenal glands.  Normal appearance of the urinary bladder given degree of distention.  Stomach/Bowel: Scattered colonic diverticulosis without evidence of superimposed acute diverticulitis. Normal appearance of the terminal ileum and appendix. No pneumoperitoneum, pneumatosis or portal venous gas.  Vascular/Lymphatic: Normal caliber the abdominal aorta. The major branch vessels of the abdominal aorta appear patent on this non CTA examination. Interval development of right pelvic  sidewall lymphadenopathy with index right pelvic sidewall lymph node measuring 1 cm in greatest short axis diameter (image 77, series 2), previously, 0.7 cm. Adjacent pelvic sidewall lymph node now measures approximately 1.1 cm (image 79) and right external iliac chain lymph node measuring 1.4 cm (image 77, series 2). Scattered retroperitoneal lymph nodes are numerous though individually not enlarged by size criteria.  Reproductive: Re demonstrated approximately 6.2 x 5.5 cm fat containing right-sided ovarian dermoid, similar to abdominal CT performed 09/20/2017, previously, 6.7 x 6.0 cm. Myomatous uterus including approximately 7.9 x 5.1 x 8.3 cm right-sided suspected broad ligament fibroid (axial image 67, series 2; coronal image 44, series 5). No discrete left-sided adnexal lesion. No free fluid the pelvic cul-de-sac.  Other: Small mesenteric fat containing periumbilical hernia. Minimal subcutaneous edema about the midline low back pain.  Musculoskeletal: No acute or aggressive osseous abnormalities. 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  IMPRESSION: 1. No definite explanation for patient's abdominal pain. Specifically, no evidence of enteric or urinary obstruction. 2. Unchanged size of known 6.2 cm macroscopic fat containing right-sided ovarian dermoid, similar to abdominal CT performed 09/2017, however there has been interval development of right pelvic sidewall lymphadenopathy. If not previously performed, nonemergent gynecologic referral is advised. 3. Re-demonstrated myomatous uterus including suspected approximately 8.3 cm right-sided broad ligament fibroid. 4. Scattered colonic diverticulosis without evidence of Diverticulitis.    Assessment    Umbilical hernia.  Not incarcerated and unlikely to be the cause of patient's discomfort. Would normally not repair unless she became symptomatic, however, if she is  undergoing another operation wherein it would be feasible to repair the hernia, we could certainly do so.    Plan    Will contact Terri Chambers regarding her plan for surgery.  If she plans to perform this either robotically or laparoscopically, it may be better to have 1 of my colleagues perform the umbilical hernia repair, as I do not perform laparoscopic hernia repairs.  If the operation is planned to be open, then certainly the hernia could be repaired as part of closure of the midline incision.  I will await a response from Terri Chambers and then we will make plans accordingly.       Fredirick Maudlin 07/31/2018, 12:22 PM

## 2018-08-04 ENCOUNTER — Telehealth: Payer: Self-pay

## 2018-08-04 ENCOUNTER — Telehealth: Payer: Self-pay | Admitting: Obstetrics and Gynecology

## 2018-08-04 NOTE — Telephone Encounter (Signed)
Pharmacy called and medication is scheduled to be shipped on 08/08/2018.

## 2018-08-04 NOTE — Telephone Encounter (Signed)
Pharmacy was called and the pt's medication is scheduled to arrived at the office on 08/08/18.

## 2018-08-04 NOTE — Telephone Encounter (Signed)
Terri Chambers called from [employer] [facility] and is wanting to set up a delivery for this patient of a Lupron E Pump; call back number [phone], please advise, thanks.

## 2018-08-07 ENCOUNTER — Other Ambulatory Visit: Payer: Self-pay | Admitting: General Surgery

## 2018-08-07 ENCOUNTER — Telehealth: Payer: Self-pay | Admitting: *Deleted

## 2018-08-07 DIAGNOSIS — K429 Umbilical hernia without obstruction or gangrene: Secondary | ICD-10-CM

## 2018-08-07 NOTE — Telephone Encounter (Signed)
Patient's surgery has been scheduled for 08-21-18 at [facility] with Dr. Celine Ahr. Combined case with Dr. Marcelline Mates.   The patient is aware she will be contacted by the [facility] to complete a phone interview on 08-16-18 between 1 and 5 pm.  The patient is aware to call the office should she have further questions.

## 2018-08-07 NOTE — Telephone Encounter (Signed)
-----   Message from Fredirick Maudlin, MD sent at 08/07/2018 10:05 AM EST ----- Regarding: RE: combined case Thanks!  Done. ----- Message ----- From: Dominga Ferry, CMA Sent: 08/07/2018   9:40 AM EST To: Fredirick Maudlin, MD Subject: RE: combined case                              Just put your orders in as you normally would for umbilical hernia repair. I will fill out a scheduling sheet and fax to [facility] to let them know of the plans. Thanks.  ----- Message ----- From: Fredirick Maudlin, MD Sent: 08/07/2018   9:20 AM EST To: Dominga Ferry, CMA, # Subject: combined case                                  I'm doing a combined case with Dr. Marcelline Mates on January 20th.  She is doing a myomectomy and I'm doing an umbilical hernia repair after she is done.  Since her operation is the primary one, I'm not sure what I need to do from my end.  What orders do I need to put in?   Thanks! --Lavella Hammock

## 2018-08-07 NOTE — Telephone Encounter (Signed)
Message left for patient to call the office.   Patient's surgery has been scheduled for 08-21-18 at [facility] with Dr. Celine Ahr. This is a combined case with Dr. Marcelline Mates who will be doing a myomectomy.  The patient will be contacted by the [facility] to complete a phone interview on 08-16-18 between 1 and 5  pm.  *The surgery scheduler with Dr. Andreas Blower office was on vacation today but I will have Caryl-Lyn follow up with her tomorrow in regards to patient's surgery scheduling.

## 2018-08-08 NOTE — Telephone Encounter (Signed)
I spoke with Crystal at [facility] and their portion has been posted. It will be an ambulatory case.

## 2018-08-09 ENCOUNTER — Ambulatory Visit (INDEPENDENT_AMBULATORY_CARE_PROVIDER_SITE_OTHER): Payer: Medicaid Other | Admitting: Obstetrics and Gynecology

## 2018-08-09 ENCOUNTER — Encounter: Payer: Self-pay | Admitting: Obstetrics and Gynecology

## 2018-08-09 VITALS — BP 128/79 | HR 66 | Ht 68.0 in | Wt 282.8 lb

## 2018-08-09 DIAGNOSIS — D219 Benign neoplasm of connective and other soft tissue, unspecified: Secondary | ICD-10-CM | POA: Diagnosis not present

## 2018-08-09 DIAGNOSIS — N920 Excessive and frequent menstruation with regular cycle: Secondary | ICD-10-CM

## 2018-08-09 DIAGNOSIS — G7000 Myasthenia gravis without (acute) exacerbation: Secondary | ICD-10-CM

## 2018-08-09 DIAGNOSIS — Z01818 Encounter for other preprocedural examination: Secondary | ICD-10-CM

## 2018-08-09 DIAGNOSIS — K429 Umbilical hernia without obstruction or gangrene: Secondary | ICD-10-CM

## 2018-08-09 DIAGNOSIS — D270 Benign neoplasm of right ovary: Secondary | ICD-10-CM

## 2018-08-09 DIAGNOSIS — Z6841 Body Mass Index (BMI) 40.0 and over, adult: Secondary | ICD-10-CM

## 2018-08-09 MED ORDER — LEUPROLIDE ACETATE 3.75 MG IM KIT
3.7500 mg | PACK | Freq: Once | INTRAMUSCULAR | Status: AC
  Administered 2018-08-09: 3.75 mg via INTRAMUSCULAR

## 2018-08-09 NOTE — H&P (View-Only) (Signed)
GYNECOLOGY PREOPERATIVE HISTORY AND PHYSICAL   Subjective:  Terri Chambers is a 43 y.o. G1P1001 here for surgical management of uterine fibroids, heavy menses, right ovarian cyst (dermoid), pelvic pain, and umbilical hernia.  Significant preoperative concerns include morbid obesity.  Currently on Depot Lupron for treatment of fibroids and heavy menses.   Proposed surgery: Abdominal myomectomy with right ovarian cystectomy, and umbilical hernia repair   Pertinent Gynecological History: Menses: flow is moderate to heavy, lasting 6 days, with no intermenstrual bleeding, changing pad q 2-3 hours.  Contraception: none Last mammogram: patient has never had a mammogram Last pap: 2018 (performed at [facility])   Past Medical History:  Diagnosis Date  . Asthma   . Depression   . History of asthma   . Hypertension   . Migraine headache   . Myasthenia gravis   . Obesity     Past Surgical History:  Procedure Laterality Date  . EAR TUBE REMOVAL      OB History  Gravida Para Term Preterm AB Living  1 1 1     1   SAB TAB Ectopic Multiple Live Births               # Outcome Date GA Lbr Len/2nd Weight Sex Delivery Anes PTL Lv  1 Term             Family History  Problem Relation Age of Onset  . Diabetes Mother   . Arthritis Father   . Hypertension Father   . Cerebral aneurysm Maternal Grandmother   . Congestive Heart Failure Paternal Grandmother   . Cervical cancer Other        Maternal great-grandmother died of cervical cancer  . Other Neg Hx     Social History   Socioeconomic History  . Marital status: Divorced    Spouse name: Not on file  . Number of children: 1  . Years of education: Not on file  . Highest education level: Not on file  Occupational History  . Occupation: Unempolyed    Employer: [employer]  . Financial resource strain: Not on file  . Food insecurity:    Worry: Not on file    Inability: Not on file  .  Transportation needs:    Medical: Not on file    Non-medical: Not on file  Tobacco Use  . Smoking status: Former Smoker    Last attempt to quit: 2004    Years since quitting: 16.0  . Smokeless tobacco: Never Used  Substance and Sexual Activity  . Alcohol use: No  . Drug use: No  . Sexual activity: Not Currently  Lifestyle  . Physical activity:    Days per week: Not on file    Minutes per session: Not on file  . Stress: Not on file  Relationships  . Social connections:    Talks on phone: Not on file    Gets together: Not on file    Attends religious service: Not on file    Active member of club or organization: Not on file    Attends meetings of clubs or organizations: Not on file    Relationship status: Not on file  . Intimate partner violence:    Fear of current or ex partner: Not on file    Emotionally abused: Not on file    Physically abused: Not on file    Forced sexual activity: Not on file  Other Topics Concern  . Not on  file  Social History Narrative  . Not on file    Current Outpatient Medications on File Prior to Visit  Medication Sig Dispense Refill  . atenolol (TENORMIN) 100 MG tablet Take 100 mg by mouth daily.    Marland Kitchen ketoconazole (NIZORAL) 2 % shampoo Apply 1 application topically daily as needed for irritation. AS DIRECTED    . leuprolide (LUPRON) 3.75 MG injection Inject 3.75 mg into the muscle every 28 (twenty-eight) days.     Marland Kitchen lisinopril (PRINIVIL,ZESTRIL) 20 MG tablet Take 20 mg by mouth daily.     No current facility-administered medications on file prior to visit.     No Known Allergies   Review of Systems Constitutional: No recent fever/chills/sweats Respiratory: No recent cough/bronchitis Cardiovascular: No chest pain Gastrointestinal: No recent nausea/vomiting/diarrhea Genitourinary: No UTI symptoms Hematologic/lymphatic:No history of coagulopathy or recent blood thinner use    Objective:   Blood pressure 128/79, pulse 66, height 5\' 8"   (1.727 m), weight 282 lb 12.8 oz (128.3 kg). CONSTITUTIONAL: Well-developed, well-nourished female in no acute distress.  HENT:  Normocephalic, atraumatic, External right and left ear normal. Oropharynx is clear and moist EYES: Conjunctivae and EOM are normal. Pupils are equal, round, and reactive to light. No scleral icterus.  NECK: Normal range of motion, supple, no masses SKIN: Skin is warm and dry. No rash noted. Not diaphoretic. No erythema. No pallor. NEUROLOGIC: Alert and oriented to person, place, and time. Normal reflexes, muscle tone coordination. No cranial nerve deficit noted. PSYCHIATRIC: Normal mood and affect. Normal behavior. Normal judgment and thought content. CARDIOVASCULAR: Normal heart rate noted, regular rhythm RESPIRATORY: Effort and breath sounds normal, no problems with respiration noted ABDOMEN: Soft, nontender, nondistended.  Pelvic mass at midway between pubic symphysis and umbilicus.  Small Umbilical hernia present.  PELVIC: Deferred MUSCULOSKELETAL: Normal range of motion. No edema and no tenderness. 2+ distal pulses.    Labs: No results found for this or any previous visit (from the past 336 hour(s)).   Imaging Studies: US Transvaginal Non-ob  Result Date: 07/13/2018 Patient Name: Terri Chambers DOB: 1976/03/07 MRN: [id number] ULTRASOUND REPORT Location: [facility] Date of Service: 07/13/2018 Indications:Enlarged Uterus/uterine fibroids Findings: The uterus is anteverted and measures 9.3x7.4x.4.4cm Echo texture is heterogenous with evidence of focal masses. Within the uterus are multiple suspected fibroids measuring: Fibroid 1:SS vs pedunculated ,On the Lt uterine wall,near the fundus = 4.8x6x4.4cm Fibroid 2:SS, Lt ut wall, anterior = 2x1.6x3.2cm Fibroid 3: IM vs SM, posterior ut wall near LUS = 1.7x1.3cm FB 4: SS vs pedunculated, Rt ut wall = 7.4x6.1cm The Endometrium measures 7.1 mm. Right Ovary not seen d/t filled bowel and big ut fibroids Left Ovary  measures 4.1 cm3. It is normal in appearance. Survey of the adnexa demonstrates no adnexal masses. There is no free fluid in the cul de sac. Impression: 1. Within the uterus are multiple suspected fibroids the fibroid that are clear to see are measuring: Fibroid 1:SS vs pedunculated ,On the Lt uterine wall,near the fundus = 4.8x6x4.4cm Fibroid 2:SS, Lt ut wall, anterior = 2x1.6x3.2cm Fibroid 3: IM vs SM, posterior ut wall near LUS = 1.7x1.3cm FB 4: SS vs pedunculated, Rt ut wall = 7.4x6.1cm Recommendations: 1.Clinical correlation with the patient's History and Physical Exam. Abeer Alsammarraie,RDMS ,I have reviewed this study and agree with documented findings. Rubie Maid, MD [facility]       EXAM: CT ABDOMEN AND PELVIS WITH CONTRAST (performed 06/13/2018)  CLINICAL DATA: Generalized right-sided abdominal pain beginning Yesterday.  TECHNIQUE: Multidetector  CT imaging of the abdomen and pelvis was performed using the standard protocol following bolus administration of intravenous contrast.  CONTRAST: 160mL ISOVUE-300 IOPAMIDOL (ISOVUE-300) INJECTION 61%  COMPARISON: CT abdomen and pelvis - 03/07/2018; 09/20/2017  FINDINGS: Lower chest: Limited visualization of the lower thorax demonstrates minimal dependent subpleural ground-glass atelectasis within the image right lower lobe. No discrete focal airspace opacities. No pleural effusion  Borderline cardiomegaly. No pericardial effusion.  Hepatobiliary: Hepatomegaly. Normal hepatic contour. No discrete hepatic lesions. Normal appearance of the gallbladder given degree distention. No radiopaque gallstones. No intra or extrahepatic biliary ductal dilatation. No ascites.  Pancreas: Normal appearance the pancreas  Spleen: Normal appearance of the spleen  Adrenals/Urinary Tract: There is symmetric enhancement and excretion of the bilateral kidneys. No definite renal stones on this postcontrast examination. No  discrete renal lesions. No urine obstruction or perinephric stranding.  Normal appearance of the bilateral adrenal glands.  Normal appearance of the urinary bladder given degree of distention.  Stomach/Bowel: Scattered colonic diverticulosis without evidence of superimposed acute diverticulitis. Normal appearance of the terminal ileum and appendix. No pneumoperitoneum, pneumatosis or portal venous gas.  Vascular/Lymphatic: Normal caliber the abdominal aorta. The major branch vessels of the abdominal aorta appear patent on this non CTA examination. Interval development of right pelvic sidewall lymphadenopathy with index right pelvic sidewall lymph node measuring 1 cm in greatest short axis diameter (image 77, series 2), previously, 0.7 cm. Adjacent pelvic sidewall lymph node now measures approximately 1.1 cm (image 79) and right external iliac chain lymph node measuring 1.4 cm (image 77, series 2). Scattered retroperitoneal lymph nodes are numerous though individually not enlarged by size criteria.  Reproductive: Re demonstrated approximately 6.2 x 5.5 cm fat containing right-sided ovarian dermoid, similar to abdominal CT performed 09/20/2017, previously, 6.7 x 6.0 cm. Myomatous uterus including approximately 7.9 x 5.1 x 8.3 cm right-sided suspected broad ligament fibroid (axial image 67, series 2; coronal image 44, series 5). No discrete left-sided adnexal lesion. No free fluid the pelvic cul-de-sac.  Other: Small mesenteric fat containing periumbilical hernia. Minimal subcutaneous edema about the midline low back pain.  Musculoskeletal: No acute or aggressive osseous abnormalities. 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  IMPRESSION: 1. No definite explanation for patient's abdominal pain. Specifically, no evidence of enteric or urinary obstruction. 2. Unchanged size of known 6.2 cm macroscopic fat  containing right-sided ovarian dermoid, similar to abdominal CT performed 09/2017, however there has been interval development of right pelvic sidewall lymphadenopathy. If not previously performed, nonemergent gynecologic referral is advised. 3. Re-demonstrated myomatous uterus including suspected approximately 8.3 cm right-sided broad ligament fibroid. 4. Scattered colonic diverticulosis without evidence of diverticulitis.   Electronically Signed By: Sandi Mariscal M.D. On: 06/13/2018 21:32   Assessment:    Fibroids Dermoid cyst of right ovary Umbilical hernia without obstruction and without gangrene Morbid obesity Pelvic pain Morbid obesity  Myasthenia gravis Plan:    Counseling: Procedure, risks, reasons, benefits and complications (including injury to bowel, bladder, major blood vessel, ureter, bleeding, possibility of transfusion, infection, or fistula formation) reviewed in detail. Likelihood of success in alleviating the patient's condition was discussed. Routine postoperative instructions will be reviewed with the patient and her family in detail after surgery.  The patient concurred with the proposed plan, giving informed written consent for the surgery.   Preop testing ordered. Instructions reviewed, including NPO after midnight. Coordinating surgery with General Surgery for repair of umbiical hernia to be performed during joint surgery.  Continue Depot Lupron (just received final injection) for medical management of fibroids and heavy menses.  Myasthenia gravis in remission, currently on no meds.      Rubie Maid, MD [facility]

## 2018-08-09 NOTE — H&P (Signed)
GYNECOLOGY PREOPERATIVE HISTORY AND PHYSICAL   Subjective:  Terri Chambers is a 43 y.o. G1P1001 here for surgical management of uterine fibroids, heavy menses, right ovarian cyst (dermoid), pelvic pain, and umbilical hernia.  Significant preoperative concerns include morbid obesity.  Currently on Depot Lupron for treatment of fibroids and heavy menses.   Proposed surgery: Abdominal myomectomy with right ovarian cystectomy, and umbilical hernia repair   Pertinent Gynecological History: Menses: flow is moderate to heavy, lasting 6 days, with no intermenstrual bleeding, changing pad q 2-3 hours.  Contraception: none Last mammogram: patient has never had a mammogram Last pap: 2018 (performed at [facility])   Past Medical History:  Diagnosis Date  . Asthma   . Depression   . History of asthma   . Hypertension   . Migraine headache   . Myasthenia gravis   . Obesity     Past Surgical History:  Procedure Laterality Date  . EAR TUBE REMOVAL      OB History  Gravida Para Term Preterm AB Living  1 1 1     1   SAB TAB Ectopic Multiple Live Births               # Outcome Date GA Lbr Len/2nd Weight Sex Delivery Anes PTL Lv  1 Term             Family History  Problem Relation Age of Onset  . Diabetes Mother   . Arthritis Father   . Hypertension Father   . Cerebral aneurysm Maternal Grandmother   . Congestive Heart Failure Paternal Grandmother   . Cervical cancer Other        Maternal great-grandmother died of cervical cancer  . Other Neg Hx     Social History   Socioeconomic History  . Marital status: Divorced    Spouse name: Not on file  . Number of children: 1  . Years of education: Not on file  . Highest education level: Not on file  Occupational History  . Occupation: Unempolyed    Employer: [employer]  . Financial resource strain: Not on file  . Food insecurity:    Worry: Not on file    Inability: Not on file  .  Transportation needs:    Medical: Not on file    Non-medical: Not on file  Tobacco Use  . Smoking status: Former Smoker    Last attempt to quit: 2004    Years since quitting: 16.0  . Smokeless tobacco: Never Used  Substance and Sexual Activity  . Alcohol use: No  . Drug use: No  . Sexual activity: Not Currently  Lifestyle  . Physical activity:    Days per week: Not on file    Minutes per session: Not on file  . Stress: Not on file  Relationships  . Social connections:    Talks on phone: Not on file    Gets together: Not on file    Attends religious service: Not on file    Active member of club or organization: Not on file    Attends meetings of clubs or organizations: Not on file    Relationship status: Not on file  . Intimate partner violence:    Fear of current or ex partner: Not on file    Emotionally abused: Not on file    Physically abused: Not on file    Forced sexual activity: Not on file  Other Topics Concern  . Not on  file  Social History Narrative  . Not on file    Current Outpatient Medications on File Prior to Visit  Medication Sig Dispense Refill  . atenolol (TENORMIN) 100 MG tablet Take 100 mg by mouth daily.    Marland Kitchen ketoconazole (NIZORAL) 2 % shampoo Apply 1 application topically daily as needed for irritation. AS DIRECTED    . leuprolide (LUPRON) 3.75 MG injection Inject 3.75 mg into the muscle every 28 (twenty-eight) days.     Marland Kitchen lisinopril (PRINIVIL,ZESTRIL) 20 MG tablet Take 20 mg by mouth daily.     No current facility-administered medications on file prior to visit.     No Known Allergies   Review of Systems Constitutional: No recent fever/chills/sweats Respiratory: No recent cough/bronchitis Cardiovascular: No chest pain Gastrointestinal: No recent nausea/vomiting/diarrhea Genitourinary: No UTI symptoms Hematologic/lymphatic:No history of coagulopathy or recent blood thinner use    Objective:   Blood pressure 128/79, pulse 66, height 5\' 8"   (1.727 m), weight 282 lb 12.8 oz (128.3 kg). CONSTITUTIONAL: Well-developed, well-nourished female in no acute distress.  HENT:  Normocephalic, atraumatic, External right and left ear normal. Oropharynx is clear and moist EYES: Conjunctivae and EOM are normal. Pupils are equal, round, and reactive to light. No scleral icterus.  NECK: Normal range of motion, supple, no masses SKIN: Skin is warm and dry. No rash noted. Not diaphoretic. No erythema. No pallor. NEUROLOGIC: Alert and oriented to person, place, and time. Normal reflexes, muscle tone coordination. No cranial nerve deficit noted. PSYCHIATRIC: Normal mood and affect. Normal behavior. Normal judgment and thought content. CARDIOVASCULAR: Normal heart rate noted, regular rhythm RESPIRATORY: Effort and breath sounds normal, no problems with respiration noted ABDOMEN: Soft, nontender, nondistended.  Pelvic mass at midway between pubic symphysis and umbilicus.  Small Umbilical hernia present.  PELVIC: Deferred MUSCULOSKELETAL: Normal range of motion. No edema and no tenderness. 2+ distal pulses.    Labs: No results found for this or any previous visit (from the past 336 hour(s)).   Imaging Studies: US Transvaginal Non-ob  Result Date: 07/13/2018 Patient Name: Terri Chambers DOB: 10/21/1975 MRN: [id number] ULTRASOUND REPORT Location: [facility] Date of Service: 07/13/2018 Indications:Enlarged Uterus/uterine fibroids Findings: The uterus is anteverted and measures 9.3x7.4x.4.4cm Echo texture is heterogenous with evidence of focal masses. Within the uterus are multiple suspected fibroids measuring: Fibroid 1:SS vs pedunculated ,On the Lt uterine wall,near the fundus = 4.8x6x4.4cm Fibroid 2:SS, Lt ut wall, anterior = 2x1.6x3.2cm Fibroid 3: IM vs SM, posterior ut wall near LUS = 1.7x1.3cm FB 4: SS vs pedunculated, Rt ut wall = 7.4x6.1cm The Endometrium measures 7.1 mm. Right Ovary not seen d/t filled bowel and big ut fibroids Left Ovary  measures 4.1 cm3. It is normal in appearance. Survey of the adnexa demonstrates no adnexal masses. There is no free fluid in the cul de sac. Impression: 1. Within the uterus are multiple suspected fibroids the fibroid that are clear to see are measuring: Fibroid 1:SS vs pedunculated ,On the Lt uterine wall,near the fundus = 4.8x6x4.4cm Fibroid 2:SS, Lt ut wall, anterior = 2x1.6x3.2cm Fibroid 3: IM vs SM, posterior ut wall near LUS = 1.7x1.3cm FB 4: SS vs pedunculated, Rt ut wall = 7.4x6.1cm Recommendations: 1.Clinical correlation with the patient's History and Physical Exam. Abeer Alsammarraie,RDMS ,I have reviewed this study and agree with documented findings. Rubie Maid, MD [facility]       EXAM: CT ABDOMEN AND PELVIS WITH CONTRAST (performed 06/13/2018)  CLINICAL DATA: Generalized right-sided abdominal pain beginning Yesterday.  TECHNIQUE: Multidetector  CT imaging of the abdomen and pelvis was performed using the standard protocol following bolus administration of intravenous contrast.  CONTRAST: 170mL ISOVUE-300 IOPAMIDOL (ISOVUE-300) INJECTION 61%  COMPARISON: CT abdomen and pelvis - 03/07/2018; 09/20/2017  FINDINGS: Lower chest: Limited visualization of the lower thorax demonstrates minimal dependent subpleural ground-glass atelectasis within the image right lower lobe. No discrete focal airspace opacities. No pleural effusion  Borderline cardiomegaly. No pericardial effusion.  Hepatobiliary: Hepatomegaly. Normal hepatic contour. No discrete hepatic lesions. Normal appearance of the gallbladder given degree distention. No radiopaque gallstones. No intra or extrahepatic biliary ductal dilatation. No ascites.  Pancreas: Normal appearance the pancreas  Spleen: Normal appearance of the spleen  Adrenals/Urinary Tract: There is symmetric enhancement and excretion of the bilateral kidneys. No definite renal stones on this postcontrast examination. No  discrete renal lesions. No urine obstruction or perinephric stranding.  Normal appearance of the bilateral adrenal glands.  Normal appearance of the urinary bladder given degree of distention.  Stomach/Bowel: Scattered colonic diverticulosis without evidence of superimposed acute diverticulitis. Normal appearance of the terminal ileum and appendix. No pneumoperitoneum, pneumatosis or portal venous gas.  Vascular/Lymphatic: Normal caliber the abdominal aorta. The major branch vessels of the abdominal aorta appear patent on this non CTA examination. Interval development of right pelvic sidewall lymphadenopathy with index right pelvic sidewall lymph node measuring 1 cm in greatest short axis diameter (image 77, series 2), previously, 0.7 cm. Adjacent pelvic sidewall lymph node now measures approximately 1.1 cm (image 79) and right external iliac chain lymph node measuring 1.4 cm (image 77, series 2). Scattered retroperitoneal lymph nodes are numerous though individually not enlarged by size criteria.  Reproductive: Re demonstrated approximately 6.2 x 5.5 cm fat containing right-sided ovarian dermoid, similar to abdominal CT performed 09/20/2017, previously, 6.7 x 6.0 cm. Myomatous uterus including approximately 7.9 x 5.1 x 8.3 cm right-sided suspected broad ligament fibroid (axial image 67, series 2; coronal image 44, series 5). No discrete left-sided adnexal lesion. No free fluid the pelvic cul-de-sac.  Other: Small mesenteric fat containing periumbilical hernia. Minimal subcutaneous edema about the midline low back pain.  Musculoskeletal: No acute or aggressive osseous abnormalities. 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  IMPRESSION: 1. No definite explanation for patient's abdominal pain. Specifically, no evidence of enteric or urinary obstruction. 2. Unchanged size of known 6.2 cm macroscopic fat  containing right-sided ovarian dermoid, similar to abdominal CT performed 09/2017, however there has been interval development of right pelvic sidewall lymphadenopathy. If not previously performed, nonemergent gynecologic referral is advised. 3. Re-demonstrated myomatous uterus including suspected approximately 8.3 cm right-sided broad ligament fibroid. 4. Scattered colonic diverticulosis without evidence of diverticulitis.   Electronically Signed By: Sandi Mariscal M.D. On: 06/13/2018 21:32   Assessment:    Fibroids Dermoid cyst of right ovary Umbilical hernia without obstruction and without gangrene Morbid obesity Pelvic pain Morbid obesity  Myasthenia gravis Plan:    Counseling: Procedure, risks, reasons, benefits and complications (including injury to bowel, bladder, major blood vessel, ureter, bleeding, possibility of transfusion, infection, or fistula formation) reviewed in detail. Likelihood of success in alleviating the patient's condition was discussed. Routine postoperative instructions will be reviewed with the patient and her family in detail after surgery.  The patient concurred with the proposed plan, giving informed written consent for the surgery.   Preop testing ordered. Instructions reviewed, including NPO after midnight. Coordinating surgery with General Surgery for repair of umbiical hernia to be performed during joint surgery.  Continue Depot Lupron (just received final injection) for medical management of fibroids and heavy menses.  Myasthenia gravis in remission, currently on no meds.      Rubie Maid, MD [facility]

## 2018-08-09 NOTE — Progress Notes (Signed)
    GYNECOLOGY PROGRESS NOTE  Subjective:    Patient ID: Terri Chambers, female    DOB: [birth year], 43 y.o.   MRN: [id number]  HPI  Patient is a 43 y.o. G70P1001 female with a PMH of morbid obesity and myasthenia gravis who presents for pre-operative visit for scheduled procedure of abdominal myomectomy, right ovarian cystectomy (possible oophorectomy), and umbilical hernia repair (to be performed jointly with General Surgery).  Indications for procedure include fibroid uterus, heavy menses, pelvic pain, and umbilical hernia.  Is currently due for last Lupron injection today.   The following portions of the patient's history were reviewed and updated as appropriate: allergies, current medications, past family history, past medical history, past social history, past surgical history and problem list.  Review of Systems Pertinent items noted in HPI and remainder of comprehensive ROS otherwise negative.   Objective:   Blood pressure 128/79, pulse 66, height 5\' 8"  (1.727 m), weight 282 lb 12.8 oz (128.3 kg). Body mass index is 43 kg/m. General appearance: alert and no distress Abdomen: normal findings: bowel sounds normal and soft, non-tender and abnormal findings:  mass, located arising from the pelvis, ~ 16 week sized, mobile and umbilical hernia (reducible) Pelvic: deferred See H&P for remainder of exam.   Assessment:   Fibroid uterus Heavy menses Umbilical hernia Right ovarian cyst (dermoid) Myasthenia gravis Morbid obesity   Plan:   - Patient desires surgical management with abdominal myomectomy with right oophorectomy and umbilical hernia repair.  The risks of surgery were discussed in detail with the patient including but not limited to: bleeding which may require transfusion or reoperation; infection which may require prolonged hospitalization or re-hospitalization and antibiotic therapy; injury to bowel, bladder, ureters and major vessels or other surrounding organs; need for  additional procedures including laparotomy; thromboembolic phenomenon, incisional problems and other postoperative or anesthesia complications.  Patient was told that the likelihood that her condition and symptoms will be treated effectively with this surgical management was very high; the postoperative expectations were also discussed in detail. The patient also understands the alternative treatment options which were discussed in full. All questions were answered.  She was told that she will be contacted by our surgical scheduler regarding the time and date of her surgery; routine preoperative instructions of having nothing to eat or drink after midnight on the day prior to surgery and also coming to the hospital 1.5 hours prior to her time of surgery were also emphasized.  She was told she may be called for a preoperative appointment about a week prior to surgery and will be given further preoperative instructions at that visit. Printed patient education handouts about the procedure were given to the patient to review at home.  Surgery is currently scheduled for 08/21/18, with General Surgery performing umbilical hernia repair.  Will have cell-savers present for procedure.  - Given final injection of Depot Lupron today to continue to attempt decreasing size of fibroids as well as management of patient's heavy bleeding.  - H/o Mysathenia gravis, currently in remission. On no meds.   Rubie Maid, MD [facility]

## 2018-08-09 NOTE — Progress Notes (Signed)
Pt is present today for lupron injection and for pre-op visit.

## 2018-08-09 NOTE — Patient Instructions (Signed)
Myomectomy    Myomectomy is a surgery in which a non-cancerous fibroid (myoma) is removed from the uterus. Myomas are tumors made up of fibrous tissue. They are often called fibroid tumors. Fibroid tumors can range from the size of a pea to the size of a grapefruit. In a myomectomy, the fibroid tumor is removed without removing the uterus. Because these tumors are rarely cancerous, this surgery is usually done only if the tumor is growing or causing symptoms such as pain, pressure, bleeding, or pain with intercourse.  Tell a health care provider about:  · Any allergies you have.  · All medicines you are taking, including vitamins, herbs, eye drops, creams, and over-the-counter medicines.  · Any problems you or family members have had with anesthetic medicines.  · Any blood disorders you have.  · Any surgeries you have had.  · Any medical conditions you have.  What are the risks?  Generally, this is a safe procedure. However, problems may occur, including:  · Bleeding.  · Infection.  · Allergic reactions to medicines.  · Damage to other structures or organs.  · Blood clots in the legs, chest, or brain.  · Scar tissue on other organs and in the pelvis. This may require another surgery to remove the scar tissue.  What happens before the procedure?  Staying hydrated  Follow instructions from your health care provider about hydration, which may include:  · Up to 2 hours before the procedure - you may continue to drink clear liquids, such as water, clear fruit juice, black coffee, and plain tea.  Eating and drinking restrictions  Follow instructions from your health care provider about eating and drinking, which may include:  · 8 hours before the procedure - stop eating heavy meals or foods such as meat, fried foods, or fatty foods.  · 6 hours before the procedure - stop eating light meals or foods, such as toast or cereal.  · 6 hours before the procedure - stop drinking milk or drinks that contain milk.  · 2 hours before  the procedure - stop drinking clear liquids.  General instructions  · Ask your health care provider about:  ? Changing or stopping your regular medicines. This is especially important if you are taking diabetes medicines or blood thinners.  ? Taking medicines such as aspirin and ibuprofen. These medicines can thin your blood. Do not take these medicines before your procedure if your health care provider instructs you not to.  · Do not drink alcohol the day before the surgery.  · Do not use any products that contain nicotine or tobacco, such as cigarettes and e-cigarettes, for 2 weeks before the procedure. If you need help quitting, ask your health care provider.  · Plan to have someone take you home from the hospital or clinic. Also arrange for someone to help you with activities during your recovery.  What happens during the procedure?  · To reduce your risk of infection:  ? Your health care team will wash or sanitize their hands.  ? Your skin will be washed with soap.  ? Hair may be removed from the surgical area.  · An IV tube will be inserted into one of your veins. Medicines will be able to flow directly into your body through this IV tube.  · You will be given one or more of the following:  ? A medicine to help you relax (sedative).  ? A medicine to make you fall asleep (general anesthetic).  ·   Small monitors will be attached to your body. They will be used to check your heart, blood pressure, and oxygen level.  · A breathing tube will be placed into your lungs during the procedure.  · A thin, flexible tube (catheter) will be inserted into your bladder to collect urine.  · Your surgeon will use one of the following methods to perform the procedure. The method used will depend on the size, shape, location, and number of fibroids.  Hysteroscopic myomectomy  This method may be used when the fibroid tumor is inside the cavity of the uterus. A long, thin tube with a lens (hysteroscope) will be inserted into the  uterus through the vagina. A saline solution will be put into the uterus. This will expand the uterus and allow the surgeon to see the fibroids. Tools will be passed through the hysteroscope to remove the fibroid tumor in pieces.  Laparoscopic myomectomy  A few small incisions will be made in the lower abdomen. A thin, lighted tube with a camera (laparoscope) will be inserted through one of the incisions. This will give the surgeon a good view of the area. The fibroid tumor will be removed through the other incisions. The incisions will then be closed with stitches (sutures) or staples.  Abdominal myomectomy  This method is used when the fibroid tumor cannot be removed with a hysteroscope or laparoscope. The surgery will be done through a larger surgical incision in the abdomen. The fibroid tumor will be removed through this incision. The incision will be closed with sutures or staples. Recovery time will be longer if this method is used.  The procedures may vary among health care providers and hospitals.  What happens after the procedure?  · Your blood pressure, heart rate, breathing rate, and blood oxygen level will be monitored until the medicines you were given have worn off.  · The IV access tube and catheter will remain on your body for a period of time.  · You may be given medicine for pain or to help you sleep.  · You may be given an antibiotic medicine if needed.  · Do not drive for 24 hours if you were given a sedative.  Summary  · Myomectomy is surgery to remove a noncancerous fibroid (myoma) from the uterus.  · This surgery is usually done only if the tumor is growing or causing symptoms such as pain, pressure, bleeding, or pain during intercourse.  · Follow instructions from your health care provider about eating and drinking before the procedure.  · Recovery time from this procedure depends on the method. The abdominal method will require a longer recovery time.  This information is not intended to  replace advice given to you by your health care provider. Make sure you discuss any questions you have with your health care provider.  Document Released: 05/16/2007 Document Revised: 08/19/2016 Document Reviewed: 08/19/2016  [employer] Interactive Patient Education © 2019 [employer]

## 2018-08-16 ENCOUNTER — Other Ambulatory Visit: Payer: Self-pay

## 2018-08-16 ENCOUNTER — Encounter
Admission: RE | Admit: 2018-08-16 | Discharge: 2018-08-16 | Disposition: A | Discharge status: Home / Self Care | Payer: Medicaid Other | Source: Ambulatory Visit | Attending: Obstetrics and Gynecology | Admitting: Obstetrics and Gynecology

## 2018-08-16 HISTORY — DX: Anemia, unspecified: D64.9

## 2018-08-16 HISTORY — DX: Personal history of other medical treatment: Z92.89

## 2018-08-16 HISTORY — DX: Myasthenia gravis without (acute) exacerbation: G70.00

## 2018-08-16 NOTE — Patient Instructions (Addendum)
Your procedure is scheduled on: 08-21-18 MONDAY Report to Same Day Surgery 2nd floor medical mall [facility] Entrance-take elevator on left to 2nd floor.  Check in with surgery information desk.) To find out your arrival time please call [phone] between 1PM - 3PM on 08-18-18 FRIDAY  Remember: Instructions that are not followed completely may result in serious medical risk, up to and including death, or upon the discretion of your surgeon and anesthesiologist your surgery may need to be rescheduled.    _x___ 1. Do not eat food after midnight the night before your procedure. NO GUM OR CANDY AFTER MIDNIGHT.  You may drink clear liquids up to 2 hours before you are scheduled to arrive at the hospital for your procedure.  Do not drink clear liquids within 2 hours of your scheduled arrival to the hospital.  Clear liquids include  --Water or Apple juice without pulp  --Clear carbohydrate beverage such as ClearFast or Gatorade  --Black Coffee or Clear Tea (No milk, no creamers, do not add anything to the coffee or Tea   ____Ensure clear carbohydrate drink on the way to the hospital for bariatric patients  _X___Ensure clear carbohydrate drink 3 hours before surgery     __x__ 2. No Alcohol for 24 hours before or after surgery.   __x__3. No Smoking or e-cigarettes for 24 prior to surgery.  Do not use any chewable tobacco products for at least 6 hour prior to surgery   ____  4. Bring all medications with you on the day of surgery if instructed.    __x__ 5. Notify your doctor if there is any change in your medical condition     (cold, fever, infections).    x___6. On the morning of surgery brush your teeth with toothpaste and water.  You may rinse your mouth with mouth wash if you wish.  Do not swallow any toothpaste or mouthwash.   Do not wear jewelry, make-up, hairpins, clips or nail polish.  Do not wear lotions, powders, or perfumes. You may wear deodorant.  Do not shave 48 hours prior  to surgery. Men may shave face and neck.  Do not bring valuables to the hospital.    [facility] is not responsible for any belongings or valuables.               Contacts, dentures or bridgework may not be worn into surgery.  Leave your suitcase in the car. After surgery it may be brought to your room.  For patients admitted to the hospital, discharge time is determined by your  treatment team.  _  Patients discharged the day of surgery will not be allowed to drive home.  You will need someone to drive you home and stay with you the night of your procedure.    Please read over the following fact sheets that you were given:   [facility] Preparing for Surgery   _x___ TAKE THE FOLLOWING MEDICATION THE MORNING OF SURGERY WITH A SMALL SIP OF WATER. These include:  1. ATENOLOL  2.  3.  4.  5.  6.  ____Fleets enema or Magnesium Citrate as directed.   _x___ Use CHG Soap or sage wipes as directed on instruction sheet   ____ Use inhalers on the day of surgery and bring to hospital day of surgery  ____ Stop Metformin and Janumet 2 days prior to surgery.    ____ Take 1/2 of usual insulin dose the night before surgery and none on the morning  surgery.   ____ Follow recommendations from Cardiologist, Pulmonologist or PCP regarding stopping Aspirin, Coumadin, Plavix ,Eliquis, Effient, or Pradaxa, and Pletal.  X____Stop Anti-inflammatories such as Advil, Aleve, Ibuprofen, Motrin, Naproxen, Naprosyn, Goodies powders or aspirin products NOW-OK to take Tylenol    ____ Stop supplements until after surgery.     ____ Bring C-Pap to the hospital.

## 2018-08-17 ENCOUNTER — Encounter
Admission: RE | Admit: 2018-08-17 | Discharge: 2018-08-17 | Disposition: A | Discharge status: Home / Self Care | Payer: Medicaid Other | Source: Ambulatory Visit | Attending: Obstetrics and Gynecology | Admitting: Obstetrics and Gynecology

## 2018-08-17 ENCOUNTER — Other Ambulatory Visit: Payer: Self-pay

## 2018-08-17 DIAGNOSIS — Z01812 Encounter for preprocedural laboratory examination: Secondary | ICD-10-CM | POA: Diagnosis not present

## 2018-08-17 LAB — RAPID HIV SCREEN (HIV 1/2 AB+AG)
HIV 1/2 ANTIBODIES: NONREACTIVE
HIV-1 P24 ANTIGEN - HIV24: NONREACTIVE

## 2018-08-17 LAB — COMPREHENSIVE METABOLIC PANEL
ALT: 24 U/L (ref 0–44)
AST: 22 U/L (ref 15–41)
Albumin: 3.6 g/dL (ref 3.5–5.0)
Alkaline Phosphatase: 67 U/L (ref 38–126)
Anion gap: 5 (ref 5–15)
BUN: 9 mg/dL (ref 6–20)
CO2: 31 mmol/L (ref 22–32)
Calcium: 9.2 mg/dL (ref 8.9–10.3)
Chloride: 103 mmol/L (ref 98–111)
Creatinine, Ser: 0.57 mg/dL (ref 0.44–1.00)
GFR calc Af Amer: >60 mL/min (ref >60)
GFR calc non Af Amer: >60 mL/min (ref >60)
GLUCOSE: 98 mg/dL (ref 70–99)
Potassium: 2.9 mmol/L — ABNORMAL LOW (ref 3.5–5.1)
SODIUM: 139 mmol/L (ref 135–145)
TOTAL PROTEIN: 7.2 g/dL (ref 6.5–8.1)
Total Bilirubin: 0.4 mg/dL (ref 0.3–1.2)

## 2018-08-17 LAB — CBC
HCT: 37.2 % (ref 36.0–46.0)
Hemoglobin: 12.1 g/dL (ref 12.0–15.0)
MCH: 27.9 pg (ref 26.0–34.0)
MCHC: 32.5 g/dL (ref 30.0–36.0)
MCV: 85.9 fL (ref 80.0–100.0)
Platelets: 313 10*3/uL (ref 150–400)
RBC: 4.33 MIL/uL (ref 3.87–5.11)
RDW: 14.1 % (ref 11.5–15.5)
WBC: 6.8 10*3/uL (ref 4.0–10.5)
nRBC: 0 % (ref 0.0–0.2)

## 2018-08-17 LAB — TYPE AND SCREEN
ABO/RH(D): O POS
Antibody Screen: NEGATIVE

## 2018-08-17 MED ORDER — POTASSIUM CHLORIDE CRYS ER 20 MEQ PO TBCR: 40.0000 meq | EXTENDED_RELEASE_TABLET | Freq: Every day | ORAL | 0 refills | Status: DC

## 2018-08-17 NOTE — Pre-Procedure Instructions (Signed)
CALLED DR CHERRYS OFFICE AND NOTIFIED THEM OF 2.9 K+ AND ALSO FAXED THIS OVER TO THEIR OFFICE WITH FAX CONFIRMATION RECEIVED

## 2018-08-18 LAB — POSTMENOPAUSAL INTERP: LOW

## 2018-08-18 LAB — OVARIAN MALIGNANCY RISK-ROMA
Cancer Antigen (CA) 125: 4.2 U/mL (ref 0.0–38.1)
HE4 (HUMAN EPID PROT 4): 52.5 pmol/L (ref 0.0–63.6)
Postmenopausal ROMA: 0.51
Premenopausal ROMA: 0.77

## 2018-08-18 LAB — RPR: RPR Ser Ql: NONREACTIVE

## 2018-08-18 LAB — PREMENOPAUSAL INTERP: LOW

## 2018-08-20 MED ORDER — SODIUM CHLORIDE 0.9 % IV SOLN
2.0000 g | INTRAVENOUS | Status: AC
  Administered 2018-08-21: 2 g via INTRAVENOUS
  Filled 2018-08-20: qty 2

## 2018-08-21 ENCOUNTER — Encounter: Payer: Self-pay | Admitting: *Deleted

## 2018-08-21 ENCOUNTER — Encounter
Admission: RE | Disposition: A | Discharge status: Home / Self Care | Payer: Self-pay | Source: Home / Self Care | Attending: Obstetrics and Gynecology

## 2018-08-21 ENCOUNTER — Ambulatory Visit: Payer: Medicaid Other | Admitting: Anesthesiology

## 2018-08-21 ENCOUNTER — Inpatient Hospital Stay
Admission: RE | Admit: 2018-08-21 | Discharge: 2018-08-23 | DRG: 742 | Disposition: A | Discharge status: Home / Self Care | Payer: Medicaid Other | Attending: Obstetrics and Gynecology | Admitting: Obstetrics and Gynecology

## 2018-08-21 ENCOUNTER — Other Ambulatory Visit: Payer: Self-pay

## 2018-08-21 DIAGNOSIS — D259 Leiomyoma of uterus, unspecified: Principal | ICD-10-CM | POA: Diagnosis present

## 2018-08-21 DIAGNOSIS — G7000 Myasthenia gravis without (acute) exacerbation: Secondary | ICD-10-CM | POA: Diagnosis present

## 2018-08-21 DIAGNOSIS — Z6841 Body Mass Index (BMI) 40.0 and over, adult: Secondary | ICD-10-CM

## 2018-08-21 DIAGNOSIS — Z87891 Personal history of nicotine dependence: Secondary | ICD-10-CM

## 2018-08-21 DIAGNOSIS — D649 Anemia, unspecified: Secondary | ICD-10-CM | POA: Diagnosis present

## 2018-08-21 DIAGNOSIS — D252 Subserosal leiomyoma of uterus: Secondary | ICD-10-CM

## 2018-08-21 DIAGNOSIS — N920 Excessive and frequent menstruation with regular cycle: Secondary | ICD-10-CM

## 2018-08-21 DIAGNOSIS — N938 Other specified abnormal uterine and vaginal bleeding: Secondary | ICD-10-CM

## 2018-08-21 DIAGNOSIS — I10 Essential (primary) hypertension: Secondary | ICD-10-CM

## 2018-08-21 DIAGNOSIS — D270 Benign neoplasm of right ovary: Secondary | ICD-10-CM | POA: Diagnosis present

## 2018-08-21 DIAGNOSIS — R102 Pelvic and perineal pain: Secondary | ICD-10-CM | POA: Diagnosis present

## 2018-08-21 DIAGNOSIS — K5730 Diverticulosis of large intestine without perforation or abscess without bleeding: Secondary | ICD-10-CM | POA: Diagnosis present

## 2018-08-21 DIAGNOSIS — Z9889 Other specified postprocedural states: Secondary | ICD-10-CM

## 2018-08-21 DIAGNOSIS — K429 Umbilical hernia without obstruction or gangrene: Secondary | ICD-10-CM | POA: Diagnosis present

## 2018-08-21 DIAGNOSIS — D251 Intramural leiomyoma of uterus: Secondary | ICD-10-CM

## 2018-08-21 HISTORY — PX: UMBILICAL HERNIA REPAIR: SHX196

## 2018-08-21 HISTORY — PX: MYOMECTOMY: SHX85

## 2018-08-21 LAB — POCT I-STAT 4, (NA,K, GLUC, HGB,HCT)
Glucose, Bld: 137 mg/dL — ABNORMAL HIGH (ref 70–99)
HCT: 35 % — ABNORMAL LOW (ref 36.0–46.0)
Hemoglobin: 11.9 g/dL — ABNORMAL LOW (ref 12.0–15.0)
Potassium: 3.1 mmol/L — ABNORMAL LOW (ref 3.5–5.1)
Sodium: 138 mmol/L (ref 135–145)

## 2018-08-21 LAB — POCT PREGNANCY, URINE: PREG TEST UR: NEGATIVE

## 2018-08-21 LAB — ABO/RH: ABO/RH(D): O POS

## 2018-08-21 SURGERY — MYOMECTOMY, ABDOMINAL APPROACH
Anesthesia: General | Laterality: Right

## 2018-08-21 MED ORDER — MAGNESIUM CITRATE PO SOLN
1.0000 | Freq: Once | ORAL | Status: DC | PRN
  Filled 2018-08-21: qty 296

## 2018-08-21 MED ORDER — SUGAMMADEX SODIUM 200 MG/2ML IV SOLN
INTRAVENOUS | Status: AC
  Filled 2018-08-21: qty 6

## 2018-08-21 MED ORDER — LIDOCAINE HCL (CARDIAC) PF 100 MG/5ML IV SOSY
PREFILLED_SYRINGE | INTRAVENOUS | Status: DC | PRN
  Administered 2018-08-21: 100 mg via INTRAVENOUS

## 2018-08-21 MED ORDER — DIPHENHYDRAMINE HCL 25 MG PO CAPS: 25.0000 mg | ORAL_CAPSULE | Freq: Four times a day (QID) | ORAL | Status: DC | PRN

## 2018-08-21 MED ORDER — TRIAMTERENE-HCTZ 37.5-25 MG PO CAPS
1.0000 | ORAL_CAPSULE | ORAL | Status: DC
  Filled 2018-08-21: qty 1

## 2018-08-21 MED ORDER — MEPERIDINE HCL 50 MG/ML IJ SOLN: 6.2500 mg | INTRAMUSCULAR | Status: DC | PRN

## 2018-08-21 MED ORDER — CELECOXIB 200 MG PO CAPS
200.0000 mg | ORAL_CAPSULE | ORAL | Status: AC
  Administered 2018-08-21: 200 mg via ORAL

## 2018-08-21 MED ORDER — GABAPENTIN 300 MG PO CAPS
ORAL_CAPSULE | ORAL | Status: AC
  Filled 2018-08-21: qty 1

## 2018-08-21 MED ORDER — MIDAZOLAM HCL 2 MG/2ML IJ SOLN
INTRAMUSCULAR | Status: DC | PRN
  Administered 2018-08-21: 2 mg via INTRAVENOUS

## 2018-08-21 MED ORDER — FAMOTIDINE 20 MG PO TABS
20.0000 mg | ORAL_TABLET | Freq: Every day | ORAL | Status: DC
  Administered 2018-08-21 – 2018-08-23 (×3): 20 mg via ORAL
  Filled 2018-08-21 (×3): qty 1

## 2018-08-21 MED ORDER — LACTATED RINGERS IV SOLN
INTRAVENOUS | Status: DC
  Administered 2018-08-21 (×2): via INTRAVENOUS

## 2018-08-21 MED ORDER — ATENOLOL 100 MG PO TABS
100.0000 mg | ORAL_TABLET | ORAL | Status: DC
  Administered 2018-08-22 – 2018-08-23 (×2): 100 mg via ORAL
  Filled 2018-08-21 (×2): qty 1

## 2018-08-21 MED ORDER — TRIAMTERENE-HCTZ 37.5-25 MG PO TABS
1.0000 | ORAL_TABLET | ORAL | Status: DC
  Filled 2018-08-21 (×4): qty 1

## 2018-08-21 MED ORDER — LISINOPRIL 20 MG PO TABS
20.0000 mg | ORAL_TABLET | ORAL | Status: DC
  Administered 2018-08-21 – 2018-08-23 (×3): 20 mg via ORAL
  Filled 2018-08-21 (×3): qty 1

## 2018-08-21 MED ORDER — PROPOFOL 500 MG/50ML IV EMUL
INTRAVENOUS | Status: AC
  Filled 2018-08-21: qty 50

## 2018-08-21 MED ORDER — BUPIVACAINE LIPOSOME 1.3 % IJ SUSP: 20.0000 mL | Freq: Once | INTRAMUSCULAR | Status: DC

## 2018-08-21 MED ORDER — ONDANSETRON HCL 4 MG PO TABS: 4.0000 mg | ORAL_TABLET | Freq: Four times a day (QID) | ORAL | Status: DC | PRN

## 2018-08-21 MED ORDER — CHLORHEXIDINE GLUCONATE CLOTH 2 % EX PADS: 6.0000 | MEDICATED_PAD | Freq: Once | CUTANEOUS | Status: DC

## 2018-08-21 MED ORDER — FENTANYL CITRATE (PF) 250 MCG/5ML IJ SOLN
INTRAMUSCULAR | Status: AC
  Filled 2018-08-21: qty 5

## 2018-08-21 MED ORDER — OXYCODONE-ACETAMINOPHEN 5-325 MG PO TABS
1.0000 | ORAL_TABLET | Freq: Four times a day (QID) | ORAL | Status: DC
  Administered 2018-08-22 (×2): 1 via ORAL
  Administered 2018-08-22: 2 via ORAL
  Administered 2018-08-22 (×2): 1 via ORAL
  Administered 2018-08-23: 2 via ORAL
  Administered 2018-08-23: 1 via ORAL
  Administered 2018-08-23: 2 via ORAL
  Filled 2018-08-21: qty 2
  Filled 2018-08-21 (×2): qty 1
  Filled 2018-08-21: qty 2
  Filled 2018-08-21 (×3): qty 1
  Filled 2018-08-21: qty 2
  Filled 2018-08-21: qty 1

## 2018-08-21 MED ORDER — BISACODYL 10 MG RE SUPP: 10.0000 mg | Freq: Every day | RECTAL | Status: DC | PRN

## 2018-08-21 MED ORDER — ACETAMINOPHEN 500 MG PO TABS
1000.0000 mg | ORAL_TABLET | ORAL | Status: AC
  Administered 2018-08-21: 1000 mg via ORAL

## 2018-08-21 MED ORDER — TRIAMTERENE-HCTZ 37.5-25 MG PO CAPS: 1.0000 | ORAL_CAPSULE | ORAL | Status: DC

## 2018-08-21 MED ORDER — LIDOCAINE 5 % EX PTCH
MEDICATED_PATCH | CUTANEOUS | Status: AC
  Filled 2018-08-21: qty 1

## 2018-08-21 MED ORDER — PROMETHAZINE HCL 25 MG/ML IJ SOLN
25.0000 mg | Freq: Four times a day (QID) | INTRAMUSCULAR | Status: DC | PRN
  Administered 2018-08-21: 25 mg via INTRAVENOUS
  Filled 2018-08-21: qty 1

## 2018-08-21 MED ORDER — GLYCOPYRROLATE 0.2 MG/ML IJ SOLN
INTRAMUSCULAR | Status: DC | PRN
  Administered 2018-08-21: 0.1 mg via INTRAVENOUS

## 2018-08-21 MED ORDER — LIDOCAINE 5 % EX PTCH
MEDICATED_PATCH | CUTANEOUS | Status: DC | PRN
  Administered 2018-08-21: 1 via TRANSDERMAL

## 2018-08-21 MED ORDER — ALUM & MAG HYDROXIDE-SIMETH 200-200-20 MG/5ML PO SUSP: 30.0000 mL | ORAL | Status: DC | PRN

## 2018-08-21 MED ORDER — PROPOFOL 10 MG/ML IV BOLUS
INTRAVENOUS | Status: DC | PRN
  Administered 2018-08-21: 200 mg via INTRAVENOUS
  Administered 2018-08-21: 50 mg via INTRAVENOUS
  Administered 2018-08-21: 30 mg via INTRAVENOUS
  Administered 2018-08-21: 50 mg via INTRAVENOUS

## 2018-08-21 MED ORDER — HEPARIN SODIUM (PORCINE) 10000 UNIT/ML IJ SOLN
INTRAMUSCULAR | Status: AC
  Filled 2018-08-21: qty 3

## 2018-08-21 MED ORDER — CELECOXIB 200 MG PO CAPS
ORAL_CAPSULE | ORAL | Status: AC
  Administered 2018-08-21: 200 mg via ORAL
  Filled 2018-08-21: qty 1

## 2018-08-21 MED ORDER — FENTANYL CITRATE (PF) 100 MCG/2ML IJ SOLN
INTRAMUSCULAR | Status: DC | PRN
  Administered 2018-08-21 (×2): 50 ug via INTRAVENOUS
  Administered 2018-08-21: 25 ug via INTRAVENOUS
  Administered 2018-08-21: 50 ug via INTRAVENOUS
  Administered 2018-08-21: 25 ug via INTRAVENOUS

## 2018-08-21 MED ORDER — LISINOPRIL 20 MG PO TABS: 20.0000 mg | ORAL_TABLET | ORAL | Status: DC

## 2018-08-21 MED ORDER — FENTANYL CITRATE (PF) 100 MCG/2ML IJ SOLN
INTRAMUSCULAR | Status: AC
  Administered 2018-08-21: 25 ug via INTRAVENOUS
  Filled 2018-08-21: qty 2

## 2018-08-21 MED ORDER — ACETAMINOPHEN 500 MG PO TABS
ORAL_TABLET | ORAL | Status: AC
  Administered 2018-08-21: 1000 mg via ORAL
  Filled 2018-08-21: qty 2

## 2018-08-21 MED ORDER — ROCURONIUM BROMIDE 100 MG/10ML IV SOLN
INTRAVENOUS | Status: DC | PRN
  Administered 2018-08-21: 35 mg via INTRAVENOUS
  Administered 2018-08-21: 5 mg via INTRAVENOUS

## 2018-08-21 MED ORDER — SIMETHICONE 80 MG PO CHEW
80.0000 mg | CHEWABLE_TABLET | Freq: Four times a day (QID) | ORAL | Status: DC | PRN
  Administered 2018-08-22: 80 mg via ORAL
  Filled 2018-08-21: qty 1

## 2018-08-21 MED ORDER — HYDROMORPHONE HCL 1 MG/ML IJ SOLN
1.0000 mg | INTRAMUSCULAR | Status: DC | PRN
  Filled 2018-08-21: qty 1

## 2018-08-21 MED ORDER — LIDOCAINE HCL (PF) 2 % IJ SOLN
INTRAMUSCULAR | Status: AC
  Filled 2018-08-21: qty 10

## 2018-08-21 MED ORDER — GLYCOPYRROLATE 0.2 MG/ML IJ SOLN
INTRAMUSCULAR | Status: AC
  Filled 2018-08-21: qty 1

## 2018-08-21 MED ORDER — FENTANYL CITRATE (PF) 100 MCG/2ML IJ SOLN
25.0000 ug | INTRAMUSCULAR | Status: DC | PRN
  Administered 2018-08-21 (×4): 25 ug via INTRAVENOUS

## 2018-08-21 MED ORDER — SUCCINYLCHOLINE CHLORIDE 20 MG/ML IJ SOLN
INTRAMUSCULAR | Status: AC
  Filled 2018-08-21: qty 1

## 2018-08-21 MED ORDER — SODIUM CHLORIDE (PF) 0.9 % IJ SOLN
INTRAMUSCULAR | Status: AC
  Filled 2018-08-21: qty 100

## 2018-08-21 MED ORDER — MIDAZOLAM HCL 2 MG/2ML IJ SOLN
INTRAMUSCULAR | Status: AC
  Filled 2018-08-21: qty 2

## 2018-08-21 MED ORDER — DEXAMETHASONE SODIUM PHOSPHATE 10 MG/ML IJ SOLN
INTRAMUSCULAR | Status: DC | PRN
  Administered 2018-08-21: 10 mg via INTRAVENOUS

## 2018-08-21 MED ORDER — SUCCINYLCHOLINE CHLORIDE 20 MG/ML IJ SOLN
INTRAMUSCULAR | Status: DC | PRN
  Administered 2018-08-21: 120 mg via INTRAVENOUS

## 2018-08-21 MED ORDER — ONDANSETRON HCL 4 MG/2ML IJ SOLN
4.0000 mg | Freq: Four times a day (QID) | INTRAMUSCULAR | Status: DC | PRN
  Administered 2018-08-21: 4 mg via INTRAVENOUS
  Filled 2018-08-21: qty 2

## 2018-08-21 MED ORDER — OXYCODONE HCL 5 MG PO TABS: 5.0000 mg | ORAL_TABLET | Freq: Once | ORAL | Status: DC | PRN

## 2018-08-21 MED ORDER — SENNOSIDES-DOCUSATE SODIUM 8.6-50 MG PO TABS: 1.0000 | ORAL_TABLET | Freq: Every evening | ORAL | Status: DC | PRN

## 2018-08-21 MED ORDER — DOCUSATE SODIUM 100 MG PO CAPS
100.0000 mg | ORAL_CAPSULE | Freq: Two times a day (BID) | ORAL | Status: DC
  Administered 2018-08-21 – 2018-08-23 (×4): 100 mg via ORAL
  Filled 2018-08-21 (×4): qty 1

## 2018-08-21 MED ORDER — PROMETHAZINE HCL 25 MG/ML IJ SOLN: 6.2500 mg | INTRAMUSCULAR | Status: DC | PRN

## 2018-08-21 MED ORDER — EPHEDRINE SULFATE 50 MG/ML IJ SOLN
INTRAMUSCULAR | Status: DC | PRN
  Administered 2018-08-21: 5 mg via INTRAVENOUS
  Administered 2018-08-21: 10 mg via INTRAVENOUS
  Administered 2018-08-21: 5 mg via INTRAVENOUS
  Administered 2018-08-21: 10 mg via INTRAVENOUS
  Administered 2018-08-21: 5 mg via INTRAVENOUS

## 2018-08-21 MED ORDER — LABETALOL HCL 5 MG/ML IV SOLN
INTRAVENOUS | Status: AC
  Administered 2018-08-21: 10 mg via INTRAVENOUS
  Filled 2018-08-21: qty 4

## 2018-08-21 MED ORDER — VASOPRESSIN 20 UNIT/ML IV SOLN
INTRAVENOUS | Status: DC | PRN
  Administered 2018-08-21: 3 mL via INTRAMUSCULAR

## 2018-08-21 MED ORDER — FAMOTIDINE 20 MG PO TABS
ORAL_TABLET | ORAL | Status: AC
  Administered 2018-08-21: 20 mg
  Filled 2018-08-21: qty 1

## 2018-08-21 MED ORDER — SUGAMMADEX SODIUM 200 MG/2ML IV SOLN
INTRAVENOUS | Status: DC | PRN
  Administered 2018-08-21: 300 mg via INTRAVENOUS
  Administered 2018-08-21: 200 mg via INTRAVENOUS

## 2018-08-21 MED ORDER — PHENYLEPHRINE HCL 10 MG/ML IJ SOLN
INTRAMUSCULAR | Status: AC
  Filled 2018-08-21: qty 1

## 2018-08-21 MED ORDER — LABETALOL HCL 5 MG/ML IV SOLN
10.0000 mg | Freq: Once | INTRAVENOUS | Status: AC
  Administered 2018-08-21: 10 mg via INTRAVENOUS

## 2018-08-21 MED ORDER — LACTATED RINGERS IV SOLN
INTRAVENOUS | Status: DC
  Administered 2018-08-21 – 2018-08-22 (×3): via INTRAVENOUS

## 2018-08-21 MED ORDER — FENTANYL CITRATE (PF) 100 MCG/2ML IJ SOLN
INTRAMUSCULAR | Status: AC
  Filled 2018-08-21: qty 2

## 2018-08-21 MED ORDER — EPHEDRINE SULFATE 50 MG/ML IJ SOLN
INTRAMUSCULAR | Status: AC
  Filled 2018-08-21: qty 1

## 2018-08-21 MED ORDER — SUGAMMADEX SODIUM 500 MG/5ML IV SOLN: INTRAVENOUS | Status: DC | PRN

## 2018-08-21 MED ORDER — ONDANSETRON HCL 4 MG/2ML IJ SOLN
INTRAMUSCULAR | Status: DC | PRN
  Administered 2018-08-21: 4 mg via INTRAVENOUS

## 2018-08-21 MED ORDER — DIPHENHYDRAMINE HCL 50 MG/ML IJ SOLN: 12.5000 mg | Freq: Four times a day (QID) | INTRAMUSCULAR | Status: DC | PRN

## 2018-08-21 MED ORDER — ROCURONIUM BROMIDE 50 MG/5ML IV SOLN
INTRAVENOUS | Status: AC
  Filled 2018-08-21: qty 1

## 2018-08-21 MED ORDER — DEXAMETHASONE SODIUM PHOSPHATE 10 MG/ML IJ SOLN
INTRAMUSCULAR | Status: AC
  Filled 2018-08-21: qty 1

## 2018-08-21 MED ORDER — KETOROLAC TROMETHAMINE 30 MG/ML IJ SOLN
30.0000 mg | Freq: Four times a day (QID) | INTRAMUSCULAR | Status: AC
  Administered 2018-08-21 – 2018-08-22 (×4): 30 mg via INTRAVENOUS
  Filled 2018-08-21 (×4): qty 1

## 2018-08-21 MED ORDER — OXYCODONE HCL 5 MG/5ML PO SOLN: 5.0000 mg | Freq: Once | ORAL | Status: DC | PRN

## 2018-08-21 MED ORDER — VASOPRESSIN 20 UNIT/ML IV SOLN
INTRAVENOUS | Status: AC
  Filled 2018-08-21: qty 1

## 2018-08-21 MED ORDER — MENTHOL 3 MG MT LOZG
1.0000 | LOZENGE | OROMUCOSAL | Status: DC | PRN
  Filled 2018-08-21: qty 9

## 2018-08-21 MED ORDER — GABAPENTIN 300 MG PO CAPS: 300.0000 mg | ORAL_CAPSULE | ORAL | Status: DC

## 2018-08-21 MED ORDER — ONDANSETRON HCL 4 MG/2ML IJ SOLN
INTRAMUSCULAR | Status: AC
  Filled 2018-08-21: qty 2

## 2018-08-21 MED ORDER — ZOLPIDEM TARTRATE 5 MG PO TABS: 5.0000 mg | ORAL_TABLET | Freq: Every evening | ORAL | Status: DC | PRN

## 2018-08-21 MED ORDER — PANTOPRAZOLE SODIUM 40 MG PO TBEC
40.0000 mg | DELAYED_RELEASE_TABLET | Freq: Every day | ORAL | Status: DC
  Administered 2018-08-21 – 2018-08-23 (×3): 40 mg via ORAL
  Filled 2018-08-21 (×3): qty 1

## 2018-08-21 MED ORDER — PHENYLEPHRINE HCL 10 MG/ML IJ SOLN
INTRAMUSCULAR | Status: DC | PRN
  Administered 2018-08-21: 100 ug via INTRAVENOUS
  Administered 2018-08-21: 200 ug via INTRAVENOUS
  Administered 2018-08-21 (×2): 100 ug via INTRAVENOUS
  Administered 2018-08-21 (×2): 200 ug via INTRAVENOUS

## 2018-08-21 SURGICAL SUPPLY — 61 items
ADH SKN CLS APL DERMABOND .7 (GAUZE/BANDAGES/DRESSINGS) ×2
BLADE SURG 15 STRL LF DISP TIS (BLADE) ×2 IMPLANT
BLADE SURG 15 STRL SS (BLADE) ×4
CANISTER SUCT 1200ML W/VALVE (MISCELLANEOUS) ×8 IMPLANT
CELL SAVER ADDITIONAL TIME PER (MISCELLANEOUS) ×4
CELL SAVER STANDBY W/COLL (MISCELLANEOUS) ×4
CHLORAPREP W/TINT 26ML (MISCELLANEOUS) ×8 IMPLANT
CLOSURE WOUND 1/2 X4 (GAUZE/BANDAGES/DRESSINGS) ×1
COVER WAND RF STERILE (DRAPES) ×8 IMPLANT
DERMABOND ADVANCED (GAUZE/BANDAGES/DRESSINGS) ×2
DERMABOND ADVANCED .7 DNX12 (GAUZE/BANDAGES/DRESSINGS) ×2 IMPLANT
DRAPE LAPAROTOMY 100X77 ABD (DRAPES) ×2 IMPLANT
DRAPE LAPAROTOMY 77X122 PED (DRAPES) ×4 IMPLANT
DRAPE LAPAROTOMY TRNSV 106X77 (MISCELLANEOUS) ×4 IMPLANT
DRSG TELFA 3X8 NADH (GAUZE/BANDAGES/DRESSINGS) ×12 IMPLANT
ELECT BLADE 6 FLAT ULTRCLN (ELECTRODE) ×4 IMPLANT
ELECT CAUTERY BLADE TIP 2.5 (TIP) ×4
ELECT REM PT RETURN 9FT ADLT (ELECTROSURGICAL) ×8
ELECTRODE CAUTERY BLDE TIP 2.5 (TIP) ×2 IMPLANT
ELECTRODE REM PT RTRN 9FT ADLT (ELECTROSURGICAL) ×4 IMPLANT
GAUZE SPONGE 4X4 12PLY STRL (GAUZE/BANDAGES/DRESSINGS) ×4 IMPLANT
GLOVE BIO SURGEON STRL SZ 6.5 (GLOVE) ×6 IMPLANT
GLOVE BIO SURGEONS STRL SZ 6.5 (GLOVE) ×2
GLOVE INDICATOR 7.0 STRL GRN (GLOVE) ×8 IMPLANT
GOWN STRL REUS W/ TWL LRG LVL3 (GOWN DISPOSABLE) ×8 IMPLANT
GOWN STRL REUS W/TWL LRG LVL3 (GOWN DISPOSABLE) ×16
HANDLE SUCTION POOLE (INSTRUMENTS) IMPLANT
HEMOSTAT SURGICEL 2X14 (HEMOSTASIS) ×2 IMPLANT
HEMOSTAT SURGICEL 2X3 (HEMOSTASIS) ×2 IMPLANT
KIT TURNOVER CYSTO (KITS) ×4 IMPLANT
KIT TURNOVER KIT A (KITS) ×4 IMPLANT
LABEL OR SOLS (LABEL) ×8 IMPLANT
NEEDLE HYPO 22GX1.5 SAFETY (NEEDLE) ×4 IMPLANT
NS IRRIG 1000ML POUR BTL (IV SOLUTION) ×4 IMPLANT
NS IRRIG 500ML POUR BTL (IV SOLUTION) ×4 IMPLANT
PACK BASIN MAJOR ARMC (MISCELLANEOUS) ×4 IMPLANT
PACK BASIN MINOR ARMC (MISCELLANEOUS) ×4 IMPLANT
PAD DRESSING TELFA 3X8 NADH (GAUZE/BANDAGES/DRESSINGS) ×2 IMPLANT
RETRACTOR WND ALEXIS-O 25 LRG (MISCELLANEOUS) IMPLANT
RTRCTR WOUND ALEXIS O 25CM LRG (MISCELLANEOUS) ×4
SPONGE KITTNER 5P (MISCELLANEOUS) ×2 IMPLANT
SPONGE LAP 18X18 RF (DISPOSABLE) ×4 IMPLANT
STANDBY W/COLL CELL SAVER (MISCELLANEOUS) IMPLANT
STAPLER INSORB 30 2030 C-SECTI (MISCELLANEOUS) ×4 IMPLANT
STAPLER SKIN PROX 35W (STAPLE) ×4 IMPLANT
STRIP CLOSURE SKIN 1/2X4 (GAUZE/BANDAGES/DRESSINGS) ×3 IMPLANT
SUCTION POOLE HANDLE (INSTRUMENTS) ×4
SUT ETHIBOND NAB MO 7 #0 18IN (SUTURE) IMPLANT
SUT MNCRL 4-0 (SUTURE) ×8
SUT MNCRL 4-0 27XMFL (SUTURE) ×4
SUT VIC AB 0 CT1 27 (SUTURE) ×4
SUT VIC AB 0 CT1 27XCR 8 STRN (SUTURE) ×2 IMPLANT
SUT VIC AB 0 CT1 36 (SUTURE) ×16 IMPLANT
SUT VIC AB 3-0 SH 27 (SUTURE) ×4
SUT VIC AB 3-0 SH 27X BRD (SUTURE) ×2 IMPLANT
SUTURE MNCRL 4-0 27XMF (SUTURE) ×4 IMPLANT
SYR 10ML LL (SYRINGE) ×4 IMPLANT
SYR BULB IRRIG 60ML STRL (SYRINGE) ×8 IMPLANT
TIME ADDITIONAL PER CELL SAVER (MISCELLANEOUS) IMPLANT
TRAY FOLEY MTR SLVR 16FR STAT (SET/KITS/TRAYS/PACK) ×4 IMPLANT
TRAY PREP VAG/GEN (MISCELLANEOUS) ×4 IMPLANT

## 2018-08-21 NOTE — Interval H&P Note (Signed)
History and Physical Interval Note:  08/21/2018 7:32 AM  Terri Chambers  has presented today for surgery, with the diagnosis of UMBILICAL HERNIA, MYOMA OF UTERUS  The various methods of treatment have been discussed with the patient and family. After consideration of risks, benefits and other options for treatment, the patient has consented to  Procedure(s): ABDOMINAL MYOMECTOMY (N/A) OVARIAN CYSTECTOMY (Right) HERNIA REPAIR UMBILICAL ADULT (N/A) as a surgical intervention . I will be performing the umbilical hernia repair; Dr. Marcelline Mates will perform the remaining parts of the procedure. The patient's history has been reviewed, patient examined, no change in status, stable for surgery.  I have reviewed the patient's chart and labs.  Questions were answered to the patient's satisfaction.     Fredirick Maudlin

## 2018-08-21 NOTE — Op Note (Signed)
Procedure(s): ABDOMINAL MYOMECTOMY LAPAROSCOPIC OOPHORECTOMY HERNIA REPAIR UMBILICAL ADULT Procedure Note  Terri Chambers female 43 y.o. 08/21/2018  Indications: The patient is a 43 y.o. [id number] female with fibroid uterus, h/o dysfunctional uterine bleeding, abdominal discomfort, s/p Lupron therapy, right dermoid cyst, morbid obesity, and PMH of HTN.  Patient also with umbilical hernia desiring repair at time of myomectomy surgery.   Pre-operative Diagnosis:  fibroid uterus, h/o dysfunctional uterine bleeding, abdominal discomfort s/p Lupron therapy, right dermoid cyst, morbid obesity, umbilical hernia, and PMH of HTN.  Post-operative Diagnosis: Same  Surgeon: Rubie Maid, MD, Fredirick Maudlin, MD  Assistants:  Jeannie Fend, MD. No other capable assistant available in surgery requiring a high level assistant.   Anesthesia: General endotracheal anesthesia  Procedure Details: The patient was seen in the Holding Room. The risks, benefits, complications, treatment options, and expected outcomes were discussed with the patient.  The patient concurred with the proposed plan, giving informed consent.  The site of surgery properly noted/marked. The patient was taken to the Operating Room, identified as Terri Chambers and the procedure verified as Procedure(s) (LRB): ABDOMINAL MYOMECTOMY (N/A), LAPAROSCOPIC OOPHORECTOMY (Right), HERNIA REPAIR UMBILICAL ADULT (N/A).  A Time Out was held and the above information confirmed.   The patient received IV preoperative antibiotics approximately 30 minutes prior to procedure.  She was then taken to the operating room and general anesthesia was administered without difficulty and SCDs were placed on the lower extremities.   She was placed in dorsal supine position and prepped and draped in a sterile manner.  A Foley catheter was inserted into the bladder and attached to constant drainage.  After an adequate timeout was performed, attention was then turned to  the abdomen where a infraumbilical midline incision was made with a scalpel.  This incision was carried down to the fascia using electrocautery.  The fascia was incised in the midline and this incision was extended vertically using the bovie.  Kocher's were applied to the left lateral aspect of the fascial incision, and the rectus muscles were dissected off bluntly. The rectus muscles were separated in the midline bluntly and the peritoneum was entered bluntly, with good visualization of the bladder and bowel.  Attention was then turned to the uterus where multiple myomas were noted.  The bowel were packed away with moist laparotomy sponges and an Alexis abdominal retractor was placed.  The uterus was then delivered up through the incision.  Vasopressin solution was injected over the surface of the leiomyoma to aid with hemostasis.   A vertical incision was made over the uterine leiomyomas into the leiomyomas, and the capsules were recognized.  Using blunt methods, the leiomyoma was freed from the surrounding myometrial tissue and removed intact. There was also a pedunculated fibroid on the left lateral aspect of the uterus 1 cm from the fallopian tube, which was cauterized at the base and removed. The incisions of the myomectomy sites were closed in multiple layers, using 0 Vicryl running stitches, and the serosa was reapproximated using a 3-0 Vicryl.  Overall good hemostasis was noted.   Attention was then turned to the right ovary, which was elevated, and infundibulopelvic ligament was clamped x 2 with Heaney clamps and incised between the clamps. A 0-Vicryl using a Haeney stitch was used to ligate the distal portion of the infundibulopelvic ligament. The ovary and cyst were sent to pathology.  Good hemostasis was still noted. surgicele was placed over all incision sites after irrigation was performed.  The uterus was returned to the abdomen.  The laparotomy sponges were then removed from the abdomen.   The abdominal retractor was then removed. The peritoneum was closed using 3-0 Vicryl running stitch.  At this time, Dr. Fredirick Maudlin was called in to perform the umbilical hernia repair. Please refer to her operative note for details of that procedure including closing of the fascia with 0-PDS looped.   The subcutaneous layer was reapproximated with 2-0 Vicryl in an interrupted fashion.  The skin was closed with absorbable staples. Steri-strips were placed over the incision site. A lidoderm patch was placed laterally on either sides of the incision for pain relief.  The patient tolerated the procedure well. There were no complications during this case.  Sponge, lap, needle and instrument counts were correct x 3.  The patient was taken to the recovery room extubated and in stable condition.   Findings: Uterine leiomyomata, 5-6 in number, ranging in size  3-6 cm. Endometrial cavity was not breached. Fallopian tubes and left ovary appeared normal.  Right ovary with large dermoid cyst, ~ 6 m.  Estimated Blood Loss:  150      Drains: straight catheterization prior to procedure with  750 ml of clear urine         Total IV Fluids:  1870 ml  Specimens: Uterine fibroids, right ovary with cyst         Implants: None         Complications:  None; patient tolerated the procedure well.         Disposition: PACU - hemodynamically stable.         Condition: stable   Rubie Maid, MD [facility]

## 2018-08-21 NOTE — Transfer of Care (Signed)
Immediate Anesthesia Transfer of Care Note  Patient: Terri Chambers  Procedure(s) Performed: ABDOMINAL MYOMECTOMY (N/A ) LAPAROSCOPIC OOPHORECTOMY (Right ) HERNIA REPAIR UMBILICAL ADULT (N/A )  Patient Location: PACU  Anesthesia Type:General  Level of Consciousness: drowsy  Airway & Oxygen Therapy: Patient Spontanous Breathing and Patient connected to face mask oxygen  Post-op Assessment: Report given to RN and Post -op Vital signs reviewed and stable  Post vital signs: stable  Last Vitals:  Vitals Value Taken Time  BP 148/99 08/21/2018 10:50 AM  Temp    Pulse 71 08/21/2018 10:58 AM  Resp 24 08/21/2018 10:58 AM  SpO2 99 % 08/21/2018 10:58 AM  Vitals shown include unvalidated device data.  Last Pain:  Vitals:   08/21/18 9802  TempSrc: Oral  PainSc: 0-No pain         Complications: No apparent anesthesia complications

## 2018-08-21 NOTE — Anesthesia Preprocedure Evaluation (Signed)
Anesthesia Evaluation  Patient identified by MRN, date of birth, ID band Patient awake    Reviewed: Allergy & Precautions, NPO status , Patient's Chart, lab work & pertinent test results  History of Anesthesia Complications Negative for: history of anesthetic complications  Airway Mallampati: II  TM Distance: >3 FB Neck ROM: Full    Dental no notable dental hx.    Pulmonary asthma , neg sleep apnea, neg COPD, former smoker,    breath sounds clear to auscultation- rhonchi (-) wheezing      Cardiovascular Exercise Tolerance: Good hypertension, Pt. on medications (-) CAD, (-) Past MI, (-) Cardiac Stents and (-) CABG  Rhythm:Regular Rate:Normal - Systolic murmurs and - Diastolic murmurs    Neuro/Psych  Headaches, neg Seizures PSYCHIATRIC DISORDERS Depression  Neuromuscular disease (myasthenia gravis, pt states in remission, not on medication)    GI/Hepatic negative GI ROS, Neg liver ROS,   Endo/Other  negative endocrine ROSneg diabetes  Renal/GU negative Renal ROS     Musculoskeletal negative musculoskeletal ROS (+)   Abdominal (+) + obese,   Peds  Hematology  (+) anemia ,   Anesthesia Other Findings Past Medical History: No date: Anemia     Comment:  WITH PREGNANCY No date: Asthma     Comment:  AS A CHILD No date: Depression No date: History of asthma No date: History of plasmapheresis No date: Hypertension No date: Migraine headache No date: Myasthenia gravis 2005: Myasthenia gravis ([place]) No date: Obesity   Reproductive/Obstetrics                             Anesthesia Physical Anesthesia Plan  ASA: II  Anesthesia Plan: General   Post-op Pain Management:    Induction: Intravenous  PONV Risk Score and Plan: 2 and Ondansetron, Dexamethasone and Midazolam  Airway Management Planned: Oral ETT  Additional Equipment:   Intra-op Plan:   Post-operative Plan: Extubation in  OR  Informed Consent: I have reviewed the patients History and Physical, chart, labs and discussed the procedure including the risks, benefits and alternatives for the proposed anesthesia with the patient or authorized representative who has indicated his/her understanding and acceptance.     Dental advisory given  Plan Discussed with: CRNA and Anesthesiologist  Anesthesia Plan Comments:         Anesthesia Quick Evaluation

## 2018-08-21 NOTE — Progress Notes (Signed)
   08/21/18 1720  Clinical Encounter Type  Visited With Health care provider  Visit Type Initial  Referral From Nurse  Consult/Referral To Chaplain  Spiritual Encounters  Spiritual Needs Other (Comment)  CH was informed that it was not a good time to engage patient with AD info from patient's nurse. Agreed to return later.

## 2018-08-21 NOTE — Addendum Note (Signed)
Addendum  created 08/21/18 1453 by Lavone Orn, CRNA   Intraprocedure Flowsheets edited

## 2018-08-21 NOTE — Anesthesia Post-op Follow-up Note (Signed)
Anesthesia QCDR form completed.        

## 2018-08-21 NOTE — Anesthesia Postprocedure Evaluation (Signed)
Anesthesia Post Note  Patient: Terri Chambers  Procedure(s) Performed: ABDOMINAL MYOMECTOMY (N/A ) LAPAROSCOPIC OOPHORECTOMY (Right ) HERNIA REPAIR UMBILICAL ADULT (N/A )  Patient location during evaluation: PACU Anesthesia Type: General Level of consciousness: awake and alert and oriented Pain management: pain level controlled Vital Signs Assessment: post-procedure vital signs reviewed and stable Respiratory status: spontaneous breathing, nonlabored ventilation and respiratory function stable Cardiovascular status: blood pressure returned to baseline and stable Postop Assessment: no signs of nausea or vomiting Anesthetic complications: no     Last Vitals:  Vitals:   08/21/18 1140 08/21/18 1150  BP: (!) 169/119 (!) 144/100  Pulse: 72 74  Resp: (!) 21 19  Temp:    SpO2: 90% 90%    Last Pain:  Vitals:   08/21/18 1150  TempSrc:   PainSc: Asleep                 Adem Costlow

## 2018-08-21 NOTE — Anesthesia Procedure Notes (Signed)
Procedure Name: Intubation Date/Time: 08/21/2018 7:45 AM Performed by: Lavone Orn, CRNA Pre-anesthesia Checklist: Patient identified, Emergency Drugs available, Suction available, Patient being monitored and Timeout performed Patient Re-evaluated:Patient Re-evaluated prior to induction Oxygen Delivery Method: Circle system utilized Preoxygenation: Pre-oxygenation with 100% oxygen Induction Type: IV induction Ventilation: Mask ventilation without difficulty Laryngoscope Size: Mac and 4 Grade View: Grade I Tube type: Oral Tube size: 7.0 mm Number of attempts: 1 Airway Equipment and Method: Stylet Placement Confirmation: ETT inserted through vocal cords under direct vision,  positive ETCO2 and breath sounds checked- equal and bilateral Secured at: 22 cm Tube secured with: Tape Dental Injury: Teeth and Oropharynx as per pre-operative assessment

## 2018-08-21 NOTE — Op Note (Signed)
Operative Note  Pre-operative Diagnosis: umbilical hernia  Post-operative Diagnosis: same  Operation: umbilical hernia repair without mesh  Surgeon: Fredirick Maudlin, MD  Anesthesia: GETA  Assistant:Dr. Marcelline Mates, MD  Findings:  Hernia sac containing a small amount of fat  Estimated Blood Loss: <2 cc for my portion of the procedure         Drains: none         Specimens: hernia sac          Complications: none immediately apparent         Condition: stable  Procedure Details  The patient was identified in the preoperative holding area. The benefits, complications, treatment options, and expected outcomes were discussed with the patient. The risks of bleeding, infection, recurrence of symptoms, failure to resolve symptoms, bowel injury, any of which could require further surgery were reviewed with the patient. The patient agreed to accept these risks. The patient was then taken to the operating room, identified as Terri Chambers and the procedure verified.  A time out was performed and the above information confirmed.  Dr. Marcelline Mates performed her portion of the operation.  See her part of note for full details.  I was called into the operating room after they had closed the peritoneum of their lower midline abdominal incision.  I then extended the incision up to the umbilicus.  Incision was carried down through the subcutaneum and fat using electrocautery.  The hernia static was identified.  It was carefully dissected away from the surrounding tissues.  It was dissected off the fascia and handed off as a specimen.  The fascial defect was quite small and could easily be incorporated into the fascial closure of the lower abdominal incision.  I elected to do this using #1 looped PDS suture.  The fascia was closed primarily beginning at the apex just deep to the umbilicus.  Standard closure techniques were used with a second suture being brought up from the inferior portion.  The suture was then  knotted.  The empty space where the hernia sac had been was closed partially using several interrupted Ethibond sutures.  At that point I returned the patient to the care of Dr. Marcelline Mates for completion of the incision closure.  Postoperative care will be per the OB/GYN service.  Patient tolerated procedure well and there were no immediate complications identified. Needle, instrument, and sponge counts were reported to be correct by the nursing staff.  Fredirick Maudlin, MD FACS

## 2018-08-21 NOTE — Progress Notes (Signed)
   08/21/18 1830  Clinical Encounter Type  Visited With Health care provider  Visit Type Follow-up  Referral From Nurse  Consult/Referral To Chaplain  Spiritual Encounters  Spiritual Needs Other (Comment)  CH was informed by patient's RN that it was not a good time. Agreed to have another [facility] visit patient tomorrow.

## 2018-08-21 NOTE — Progress Notes (Signed)
Diastolic elevated 301 , Dr. Randa Lynn notified , new orders received , see [facility]

## 2018-08-21 NOTE — Interval H&P Note (Signed)
History and Physical Interval Note:  08/21/2018 7:18 AM  Terri Chambers  has presented today for surgery, with the diagnosis of UMBILICAL HERNIA, MYOMA OF UTERUS  The various methods of treatment have been discussed with the patient and family. After consideration of risks, benefits and other options for treatment, the patient has consented to  Procedure(s): ABDOMINAL MYOMECTOMY (N/A) OVARIAN CYSTECTOMY (Right), HERNIA REPAIR UMBILICAL ADULT (N/A) as a surgical intervention .  The patient's history has been reviewed, patient examined, no change in status, stable for surgery.  I have reviewed the patient's chart and labs.  Questions were answered to the patient's satisfaction.     Rubie Maid, MD [facility]

## 2018-08-22 LAB — CBC
HCT: 33 % — ABNORMAL LOW (ref 36.0–46.0)
Hemoglobin: 10.8 g/dL — ABNORMAL LOW (ref 12.0–15.0)
MCH: 28.5 pg (ref 26.0–34.0)
MCHC: 32.7 g/dL (ref 30.0–36.0)
MCV: 87.1 fL (ref 80.0–100.0)
PLATELETS: 281 10*3/uL (ref 150–400)
RBC: 3.79 MIL/uL — ABNORMAL LOW (ref 3.87–5.11)
RDW: 14.3 % (ref 11.5–15.5)
WBC: 10.4 10*3/uL (ref 4.0–10.5)
nRBC: 0 % (ref 0.0–0.2)

## 2018-08-22 NOTE — Progress Notes (Signed)
   08/22/18 1300  Clinical Encounter Type  Visited With Patient and family together  Visit Type Initial  Referral From Nurse  Consult/Referral To Chaplain  Chaplain received OR for AD. Chaplain went to patient room and introduced herself and patient was finishing lunch. Chaplain ask was this a good time to visit and patient stated yes. Chaplain ask patient was she interested in completing a AD, she share that someone mention it to her. Chaplain educated patient on AD. Chaplain notice hesitation from patient. Chaplain ask if patient had concerns or questions for her. Patient was concerned about who her two people would be and she didn't want to feel rush. Chaplain insured and explain to patient that she did not have to make any decisions today. Patient said she was leaving tomorrow. Chaplain informed patient that she could take copy with her and go to her bank to complete it . Patient seemed to be relieve by taking a deep breathe. Chaplain explained that she did not want her to feel pressured. Patient thank Chaplain for her consideration. Chaplain wish patient well and ask if there was anything else she could do for her, patient stated no.

## 2018-08-22 NOTE — Progress Notes (Signed)
Patient requesting bedside commode to go home with so she does not have to walk up/down steps at her house. Dr. Marcelline Mates notified. MD states she discussed this with patient/family yesterday and that patient is encouraged to stay on the floor with a restroom until she can walk up/down stairs as tolerated. Patient's mom to bring food and run errands, per discussion yesterday. RN to update patient. Patient also had 1 percocet at 1411. RN offered 2 but patient refused at that time, stating she "didn't want it to put [me] to sleep." Patient now requesting another percocet because pain is still 4/10. Patient to receive another percocet now, then wait the required 6hrs before receiving another dose, per Dr. Marcelline Mates.

## 2018-08-22 NOTE — Progress Notes (Signed)
Patient vitals have been stable and WDL throughout the shift. Patient refused HCTZ this morning. Patient states it drops her potassium. Dr. Marcelline Mates aware.   Patient has eaten a regular diet for breakfast and lunch. No nausea or vomiting. Voiding adequately. During morning assessment patient states she has not passed any gas but by 1400 patient states she has passed gas twice. Patient ambulated in hall after lunch (three laps around the nurses station). Tolerated well. Pain controlled with PO pain medicine. Steady around 3-4/10. Patient to shower this evening and dressing to be removed and replaced with a Telfa dressing. Abdominal binder in place. RN to continue to monitor.

## 2018-08-22 NOTE — Progress Notes (Signed)
Post-Operative Day#1, s/p Abdominal myomectomy with right oophorectomy. PMH of HTN  Subjective: no complaints, up ad lib, voiding and tolerating PO. Not yet passing flatus.   Objective: Temp:  [97.5 F (36.4 C)-98.4 F (36.9 C)] 98.1 F (36.7 C) (01/21 0428) Pulse Rate:  [51-91] 75 (01/21 0428) Resp:  [12-24] 12 (01/21 0428) BP: (134-169)/(86-119) 134/86 (01/21 0428) SpO2:  [90 %-100 %] 96 % (01/21 0428)  Physical Exam:  General: alert and no distress  Lungs: clear to auscultation bilaterally Heart: regular rate and rhythm, S1, S2 normal, no murmur, click, rub or gallop Abdomen: soft, non-tender; bowel sounds normal; no masses,  no organomegaly Pelvis:Bleeding: appropriate,  Incision: healing well, no significant drainage, no dehiscence, no significant erythema. Extremities: DVT Evaluation: No evidence of DVT seen on physical exam. Negative Homan's sign. No cords or calf tenderness. No significant calf/ankle edema.  Recent Labs    08/21/18 0703 08/22/18 0549  HGB 11.9* 10.8*  HCT 35.0* 33.0*     Lab Results  Component Value Date   CREATININE 0.57 08/17/2018     Assessment/Plan: S/p abdominal myomectomy with right oophorectomy.   Doing well.  Remove foley catheter Discontinue IVF when tolerating regular diet Encourage ambulation and use of incentive spirometer Encourage PO pain medication use with IV for breakthrough pain.  Dispo: likely d/c home tomorrow.     Rubie Maid, MD [facility]

## 2018-08-22 NOTE — Progress Notes (Addendum)
[facility] Day(s): 1.   Post op day(s): 1 Day Post-Op.   Interval History: Patient seen and examined, no acute events or new complaints overnight. Patient denied any significant abdominal pain, nausea, emesis, fever, or chills. She endorses passing flatus. Tolerating regular diet.   Review of Systems:  Constitutional: denies fever, chills  Gastrointestinal: denies abdominal pain, N/V, or diarrhea/and bowel function as per interval history Integumentary: denies any other rashes or skin discolorations except surgical incisions  Vital signs in last 24 hours: [min-max] current  Temp:  [97.5 F (36.4 C)-98.6 F (37 C)] 98.6 F (37 C) (01/21 0812) Pulse Rate:  [51-91] 78 (01/21 0812) Resp:  [12-24] 16 (01/21 0812) BP: (123-169)/(86-119) 123/86 (01/21 0812) SpO2:  [90 %-100 %] 97 % (01/21 0812)     Height: 5\' 8"  (172.7 cm) Weight: 128.2 kg BMI (Calculated): 42.98   Intake/Output this shift:  No intake/output data recorded.   Intake/Output last 2 shifts:  @IOLAST2SHIFTS @   Physical Exam:  Constitutional: alert, cooperative and no distress  Respiratory: breathing non-labored at rest  Gastrointestinal: soft, non-tender, and non-distended Integumentary: Surgical incisions are CDI, no erythema, no drainage  Labs:  CBC Latest Ref Rng & Units 08/22/2018 08/21/2018 08/17/2018  WBC 4.0 - 10.5 K/uL 10.4 - 6.8  Hemoglobin 12.0 - 15.0 g/dL 10.8(L) 11.9(L) 12.1  Hematocrit 36.0 - 46.0 % 33.0(L) 35.0(L) 37.2  Platelets 150 - 400 K/uL 281 - 313   CMP Latest Ref Rng & Units 08/21/2018 08/17/2018 06/13/2018  Glucose 70 - 99 mg/dL 137(H) 98 98  BUN 6 - 20 mg/dL - 9 8  Creatinine 0.44 - 1.00 mg/dL - 0.57 0.70  Sodium 135 - 145 mmol/L 138 139 141  Potassium 3.5 - 5.1 mmol/L 3.1(L) 2.9(L) 3.2(L)  Chloride 98 - 111 mmol/L - 103 104  CO2 22 - 32 mmol/L - 31 30  Calcium 8.9 - 10.3 mg/dL - 9.2 8.6(L)  Total Protein 6.5 - 8.1 g/dL - 7.2 7.0  Total  Bilirubin 0.3 - 1.2 mg/dL - 0.4 0.7  Alkaline Phos 38 - 126 U/L - 67 71  AST 15 - 41 U/L - 22 19  ALT 0 - 44 U/L - 24 19     Assessment/Plan:  43 y.o. female who is overall doing well 1 Day Post-Op s/p abdominal myomectomy and oophorectomy for uterine fibroids and umbilical hernia repair   - Continue regular diet as tolerates, discontinue IVF  - Pain control as needed  - Continue to monitor abdominal examination and on-going bowel function   - Encourage mobilization   - Further post-operative management per Dr Marcelline Mates  - Follow up with Dr Celine Ahr in 3 weeks.   All of the above findings and recommendations were discussed with the patient, and the medical team, and all of patient's questions were answered to her expressed satisfaction.  -- Edison Simon, PA-C [facility] 08/22/2018, 10:16 AM [phone] M-F: 7am - 4pm  I saw and evaluated the patient.  I agree with the above documentation, exam, and plan, which I have edited where appropriate. Fredirick Maudlin  1:11 PM

## 2018-08-22 NOTE — Progress Notes (Signed)
Patient ambulated in hall. 3 laps around the nurses station. Tolerated well. Patient states getting up and down is the hardest. Pain 3/10. Patient continuing to use incentive spirometer. RN continuing to encourage patient to drink plenty of fluid. RN to continue to monitor.

## 2018-08-23 MED ORDER — DOCUSATE SODIUM 100 MG PO CAPS: 100.0000 mg | ORAL_CAPSULE | Freq: Two times a day (BID) | ORAL | 2 refills | Status: DC | PRN

## 2018-08-23 MED ORDER — IBUPROFEN 800 MG PO TABS: 800.0000 mg | ORAL_TABLET | Freq: Three times a day (TID) | ORAL | 1 refills | Status: DC | PRN

## 2018-08-23 MED ORDER — OXYCODONE-ACETAMINOPHEN 5-325 MG PO TABS: 1.0000 | ORAL_TABLET | Freq: Four times a day (QID) | ORAL | 0 refills | Status: DC | PRN

## 2018-08-23 NOTE — Progress Notes (Signed)
Dr.Cherry to floor, instructs to get pt up in chair. RN to room pt is in bathroom at this time, states feels some better.

## 2018-08-23 NOTE — Progress Notes (Signed)
Post-Operative Day#2, s/p abdominal myomectomy with right oophorectomy. PMH of HTN and morbid obesity  Subjective: no complaints, up ad lib, voiding and tolerating PO. Has passed flatus but no bowel movement. She is noting some leaking from her incision, brownish discharge.  Objective: Temp:  [97.7 F (36.5 C)-98.5 F (36.9 C)] 98.5 F (36.9 C) (01/22 0810) Pulse Rate:  [61-80] 76 (01/22 0810) Resp:  [18-20] 18 (01/22 0810) BP: (111-148)/(66-98) 148/98 (01/22 0810) SpO2:  [95 %-100 %] 98 % (01/22 0810)  Physical Exam:  General: alert and no distress  Lungs: clear to auscultation bilaterally Heart: regular rate and rhythm, S1, S2 normal, no murmur, click, rub or gallop Abdomen: soft, non-tender; bowel sounds normal; no masses,  no organomegaly Pelvis:no bleeding.  Incision: healing well, small amount of serous light brown drainage. No dehiscence, no significant erythema. Extremities: DVT Evaluation: No evidence of DVT seen on physical exam. Negative Homan's sign. No cords or calf tenderness. No significant calf/ankle edema.  Recent Labs    08/21/18 0703 08/22/18 0549  HGB 11.9* 10.8*  HCT 35.0* 33.0*     Lab Results  Component Value Date   CREATININE 0.57 08/17/2018     Assessment/Plan: S/p abdominal myomectomy with right oophorectomy.   Doing well.  Regular diet Continue to encourage ambulation and use of incentive spirometer Continue PO pain medication  Patient desires something to have a bowel movement. Will prescribe milk of magnesia. Dispo: d/c home later today. Discussed wound care.     Rubie Maid, MD [facility]

## 2018-08-23 NOTE — Progress Notes (Signed)
D/C instructions provided, pt states understanding, aware of follow up appt. To car in wheelchair.

## 2018-08-23 NOTE — Discharge Summary (Signed)
Gynecology Physician Postoperative Discharge Summary  Patient ID: SOILA PRINTUP MRN: [id number] DOB/AGE: November 05, 1975 43 y.o.  Admit Date: 08/21/2018 Discharge Date: 08/23/2018  Preoperative Diagnoses: UMBILICAL HERNIA, MYOMA OF UTERUS, ABNORMAL UTERINE BLEEDING ON LUPRON, MORBID OBESITY, HYPERTENSION    Procedures: Procedure(s) (LRB): ABDOMINAL MYOMECTOMY (N/A) LAPAROSCOPIC OOPHORECTOMY (Right) HERNIA REPAIR UMBILICAL ADULT (N/A)  Hospital Course:  Terri Chambers is a 43 y.o. G1P1001  admitted for scheduled surgery.  She underwent the procedures as mentioned above (in conjunction with General Surgeon Dr. Fredirick Maudlin for repair of the umbilical hernia), her operation was uncomplicated. For further details about surgery, please refer to the operative reports. Patient had an uncomplicated postoperative course. By time of discharge on POD#2, her pain was controlled on oral pain medications; she was ambulating, voiding without difficulty, tolerating regular diet and passing flatus. She was deemed stable for discharge to home.   Significant Labs: CBC Latest Ref Rng & Units 08/22/2018 08/21/2018 08/17/2018  WBC 4.0 - 10.5 K/uL 10.4 - 6.8  Hemoglobin 12.0 - 15.0 g/dL 10.8(L) 11.9(L) 12.1  Hematocrit 36.0 - 46.0 % 33.0(L) 35.0(L) 37.2  Platelets 150 - 400 K/uL 281 - 313    Discharge Exam: Blood pressure (!) 148/98, pulse 76, temperature 98.5 F (36.9 C), temperature source Oral, resp. rate 18, height 5\' 8"  (1.727 m), weight 128.2 kg, last menstrual period 04/18/2018, SpO2 98 %. General appearance: alert and no distress  Resp: clear to auscultation bilaterally  Cardio: regular rate and rhythm  GI: soft, non-tender; bowel sounds normal; no masses, no organomegaly.  Incision: C/D/I, no erythema, no drainage noted Pelvic: scant blood on pad  Extremities: extremities normal, atraumatic, no cyanosis or edema and Homans sign is negative, no sign of DVT  Discharged Condition:  Stable  Disposition: Discharge disposition: 01-Home or Self Care       Discharge Instructions    Discharge patient   Complete by:  As directed    This afternoon (~ 4:00 pm)   Discharge disposition:  01-Home or Self Care   Discharge patient date:  08/23/2018     Allergies as of 08/23/2018   No Known Allergies     Medication List    STOP taking these medications   leuprolide 3.75 MG injection Commonly known as:  LUPRON     TAKE these medications   atenolol 100 MG tablet Commonly known as:  TENORMIN Take 100 mg by mouth every morning.   docusate sodium 100 MG capsule Commonly known as:  COLACE Take 1 capsule (100 mg total) by mouth 2 (two) times daily as needed.   ibuprofen 800 MG tablet Commonly known as:  ADVIL,MOTRIN Take 1 tablet (800 mg total) by mouth every 8 (eight) hours as needed.   ketoconazole 2 % shampoo Commonly known as:  NIZORAL Apply 1 application topically daily as needed for irritation. AS DIRECTED   lisinopril 20 MG tablet Commonly known as:  PRINIVIL,ZESTRIL Take 20 mg by mouth every morning.   oxyCODONE-acetaminophen 5-325 MG tablet Commonly known as:  PERCOCET/ROXICET Take 1-2 tablets by mouth every 6 (six) hours as needed for severe pain.   potassium chloride SA 20 MEQ tablet Commonly known as:  K-DUR,KLOR-CON Take 2 tablets (40 mEq total) by mouth daily.   triamterene-hydrochlorothiazide 37.5-25 MG capsule Commonly known as:  DYAZIDE Take 1 capsule by mouth every morning.      No future appointments. Follow-up Information    Fredirick Maudlin, MD. Schedule an appointment as soon as possible for a visit in 3  week(s).   Specialty:  General Surgery Why:  s/p umbilical hernia repair Contact information: [place] [address] [phone]        Rubie Maid, MD Follow up in 1 week(s).   Specialties:  Obstetrics and Gynecology, Radiology Why:  Post-op check Contact information: [place] [address] [phone]           Signed:   Rubie Maid, MD [facility]

## 2018-08-23 NOTE — Progress Notes (Signed)
Pt calls out and states feels [facility], RN to bedside, no distress noted oxygen 96-97% on RA, HR 66, pt denies pain , states breaths just feel shallow, especially while trying to sleep, pt states normally sleeps on stomach and is unable due to surgery, pt denies history of sleep apnea or prior sleep study. Pt repositioned to upright positioned. Dr.Cherry called and voicemail left.

## 2018-08-23 NOTE — Discharge Instructions (Signed)
Myomectomy, Care After °This sheet gives you information about how to care for yourself after your procedure. Your health care provider may also give you more specific instructions. If you have problems or questions, contact your health care provider. °What can I expect after the procedure? °After the procedure, it is common to have: °· Pain in your abdomen, especially at the incision areas. You will be given pain medicine to control the pain. °· Tiredness. This is a normal part of the recovery process. Your energy level will return to normal over the coming weeks. °· Vaginal bleeding. This is normal and will stop in the coming weeks. °· Constipation. °Recovery time from this procedure will depend on the type of procedure you had and your general overall health prior to the procedure. °Follow these instructions at home: °Medicines °· Take over-the-counter and prescription medicines only as told by your health care provider. °· Do not take aspirin because it can cause bleeding. °· If you were prescribed an antibiotic medicine, use it as told by your health care provider. Do not stop using the antibiotic even if you start to feel better. °· Do not drive or use heavy machinery while taking prescription pain medicine. °· Do not drink alcohol while taking prescription pain medicine. °Incision care ° °· Follow instructions from your health care provider about how to take care of any incisions. Make sure you: °? Wash your hands with soap and water before you change your bandage (dressing). If soap and water are not available, use hand sanitizer. °? Change your dressing as told by your health care provider. °? Leave stitches (sutures), skin glue, or adhesive strips in place. These skin closures may need to stay in place for 2 weeks or longer. If adhesive strip edges start to loosen and curl up, you may trim the loose edges. Do not remove adhesive strips completely unless your health care provider tells you to do  that. °· Check your incision areas every day for signs of infection. Check for: °? Redness, swelling, or pain. °? Fluid or blood. °? Warmth. °? Pus or a bad smell. °· Do not take baths, swim, or use a hot tub until your health care provider approves. Take showers as directed by your health care provider. °Activity °· Return to your normal activities as told by your health care provider. Ask your health care provider what activities are safe for you. °· Do not do activities that require a lot of effort until your health care provider says it is okay. °· Do not lift anything that is heavier than 15 lb (6.8 kg) until your health care provider says that it is safe. °· Do not douche, use tampons, or have sexual intercourse until your health care provider approves. °· Walk daily but take frequent rest breaks if you tire easily. °· Continue to practice deep breathing and coughing. If it hurts to cough, try holding a pillow against your belly as you cough. °· Do not drive until your health care provider approves. °General instructions °· To prevent or treat constipation while you are taking prescription pain medicine, your health care provider may recommend that you: °? Drink enough fluid to keep your urine clear or pale yellow. °? Take over-the-counter or prescription medicines. °? Eat foods that are high in fiber, such as fresh fruits and vegetables, whole grains, and beans. °? Limit foods that are high in fat and processed sugars, such as fried and sweet foods. °· Take your temperature twice a day   and write it down. If you develop a fever, this may be a sign that you have an infection. °· Do not drink alcohol. °· Have someone help you at home for 1 week or until you can do your own household activities. °· Keep all follow-up visits as told by your health care provider. This is important. °Contact a health care provider if: °· You have a fever. °· You have increasing abdominal pain that is not relieved with  medicine. °· You have nausea, vomiting, or diarrhea. °· You have pain when you urinate or you have blood in your urine. °· You have a rash on your body. °· You have pain or redness where your IV access tube was inserted. °· You have redness, swelling, or pain around an incision. °· You have fluid or blood coming from an incision. °· An incision feels warm to the touch. °· You have pus or a bad smell coming from an incision. °Get help right away if: °· You have weakness or light-headedness. °· You have pain, swelling, or redness in your legs. °· You have chest pain. °· You faint. °· You have shortness of breath. °· You have heavy vaginal bleeding. °· You have an incision that is opening up. °Summary °· Recovery time from this procedure will depend on the type of procedure you had and your general overall health prior to the procedure. °· If you were prescribed an antibiotic medicine, use it as told by your health care provider. Do not stop using the antibiotic even if you start to feel better. °· Do not douche, use tampons, or have sexual intercourse until your health care provider approves. °· Return to your normal activities as told by your health care provider. Ask your health care provider what activities are safe for you. °This information is not intended to replace advice given to you by your health care provider. Make sure you discuss any questions you have with your health care provider. °Document Released: 12/09/2010 Document Revised: 08/19/2016 Document Reviewed: 08/19/2016 °Elsevier Interactive Patient Education © 2019 [employer] ° °

## 2018-08-24 LAB — SURGICAL PATHOLOGY

## 2018-08-30 ENCOUNTER — Encounter: Payer: Self-pay | Admitting: Obstetrics and Gynecology

## 2018-08-30 ENCOUNTER — Ambulatory Visit (INDEPENDENT_AMBULATORY_CARE_PROVIDER_SITE_OTHER): Payer: Medicaid Other | Admitting: Obstetrics and Gynecology

## 2018-08-30 VITALS — BP 138/85 | HR 64 | Ht 68.0 in | Wt 291.2 lb

## 2018-08-30 DIAGNOSIS — Z9889 Other specified postprocedural states: Secondary | ICD-10-CM

## 2018-08-30 DIAGNOSIS — Z4889 Encounter for other specified surgical aftercare: Secondary | ICD-10-CM

## 2018-08-30 DIAGNOSIS — Z90721 Acquired absence of ovaries, unilateral: Secondary | ICD-10-CM

## 2018-08-30 NOTE — Progress Notes (Signed)
    OBSTETRICS/GYNECOLOGY POST-OPERATIVE CLINIC VISIT  Subjective:     Terri Chambers is a 43 y.o. female who presents to the clinic 1 weeks status post abdominal myomectomy, right oophorectomy and umbilical hernia repair for abnormal uterine bleeding and fibroids, right ovarian cyst, and pelvic discomfort. Eating a regular diet with difficulty. Bowel movements are normal. , but notes rarely having to use narcotics.   The following portions of the patient's history were reviewed and updated as appropriate: allergies, current medications, past family history, past medical history, past social history, past surgical history and problem list.  Review of Systems Pertinent items noted in HPI and remainder of comprehensive ROS otherwise negative.    Objective:    BP 138/85   Pulse 64   Ht 5\' 8"  (1.727 m)   Wt 291 lb 3.2 oz (132.1 kg)   BMI 44.28 kg/m  General:  alert and no distress  Abdomen: soft, bowel sounds active, non-tender  Incision:   healing well, no drainage, no erythema, no hernia, no seroma, no swelling, no dehiscence, incision well approximated. Bottom inch of incision with superficial wound separation.     Pathology:  A. UTERUS; MYOMECTOMY:  - LEIOMYOMAS, MULTIPLE FRAGMENTS, SUBSEROSAL AND INTRAMURAL, AGGREGATE  WEIGHT 555 GRAMS.  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   B. OVARY, RIGHT; OOPHORECTOMY:  - BENIGN CYSTIC TERATOMA (DERMOID).   C. HERNIA SAC, UMBILICAL; HERNIA REPAIR:  - FIBROADIPOSE TISSUE CONSISTENT WITH HERNIA SAC.   Assessment:    Doing well postoperatively.   Plan:   1. Continue any current medications. 2. Wound care discussed. Placed steri-strips on small area of wound separation at base of incision.  3. Operative findings again reviewed. Pathology report discussed. 4. Activity restrictions: no bending, stooping, or squatting and no lifting more than 10-15 pounds, and pelvic rest 5. Anticipated return to work: 4  weeks. 6. Follow up: 4 weeks for final post-op visit.     Rubie Maid, MD [facility]  t

## 2018-08-30 NOTE — Progress Notes (Signed)
Pt is present today for a 1 week follow up after surgery. Pt stated that she is doing well no problems.

## 2018-09-04 ENCOUNTER — Telehealth: Payer: Self-pay | Admitting: Obstetrics and Gynecology

## 2018-09-04 NOTE — Telephone Encounter (Signed)
Spoke with pt and stated that she was in a lot of pain and wanted to come in to see [facility] to see what she could do concerning her incision. Pt made an appointment to be seen by [facility] tomorrow.

## 2018-09-04 NOTE — Telephone Encounter (Signed)
The patient would like to speak with a nurse as soon as possible. The patient recently had surgery with Dr. Marcelline Mates and is stating that a few of her stiches came apart and her wound/incision is open. The patient stated that she is extremely uncomfortable and is hoping to be seen today or tomorrow. Please advise.

## 2018-09-05 ENCOUNTER — Encounter: Payer: Self-pay | Admitting: Obstetrics and Gynecology

## 2018-09-05 ENCOUNTER — Ambulatory Visit (INDEPENDENT_AMBULATORY_CARE_PROVIDER_SITE_OTHER): Payer: Medicaid Other | Admitting: Obstetrics and Gynecology

## 2018-09-05 VITALS — BP 136/90 | HR 74 | Ht 68.0 in | Wt 284.9 lb

## 2018-09-05 DIAGNOSIS — Z5189 Encounter for other specified aftercare: Secondary | ICD-10-CM

## 2018-09-05 NOTE — Progress Notes (Signed)
Pt stated that she has one area that is hurting her.

## 2018-09-05 NOTE — Progress Notes (Signed)
   [facility] [address] [place], [place]  [address] Phone:  [phone]   Fax:  [phone] Subjective:     Terri Chambers presents to the clinic 2 weeks following abdominal myomectomy. Patient overall doing well but notes that there is a stitch at the bottom of her incision is bothering her.    Objective:    BP 136/90   Pulse 74   Ht 5\' 8"  (1.727 m)   Wt 284 lb 14.4 oz (129.2 kg)   BMI 43.32 kg/m   General:  alert and no distress  Abdomen: soft, bowel sounds active, non-tender  Incision:   healing well, no drainage, no erythema, no hernia, no seroma, no swelling, no dehiscence, incision well approximated. Small area of granulation tissue at base of incision with loose suture material protruding.      Assessment:    Doing well postoperatively.    Plan:   1. Continue any current medications as needed. 2. Wound care discussed. Suture material cut. Incision cleaned and bandage placed.  Advised to continue use of bandage and neosporin x 1 week to aid healing.  3. Pt is to increase activities as tolerated. May drive starting today. 4. Follow up: 2 weeks for final post-op check.     Rubie Maid, MD [facility]

## 2018-09-18 ENCOUNTER — Encounter: Payer: Self-pay | Admitting: General Surgery

## 2018-09-18 ENCOUNTER — Other Ambulatory Visit: Payer: Self-pay

## 2018-09-18 ENCOUNTER — Ambulatory Visit (INDEPENDENT_AMBULATORY_CARE_PROVIDER_SITE_OTHER): Payer: Medicaid Other | Admitting: General Surgery

## 2018-09-18 VITALS — BP 161/108 | HR 71 | Temp 97.7°F | Resp 14 | Ht 68.0 in | Wt 287.0 lb

## 2018-09-18 DIAGNOSIS — Z09 Encounter for follow-up examination after completed treatment for conditions other than malignant neoplasm: Secondary | ICD-10-CM

## 2018-09-18 DIAGNOSIS — Z8719 Personal history of other diseases of the digestive system: Secondary | ICD-10-CM

## 2018-09-18 NOTE — Patient Instructions (Addendum)
Follow up as needed. Lifting restrictions for the next two weeks. No lifting more than 10 pounds for the next two weeks. May use lotion or Cortizone cream for itching.

## 2018-09-18 NOTE — Progress Notes (Signed)
Terri Chambers is here today for a postoperative visit after undergoing open repair of an umbilical hernia.  This operation was performed in conjunction with the GYN service who performed a myomectomy.  I simply extended their incision to encompass the hernia, resected the sac and closed the defect primarily.  She states that she has done well since her operation.  Her pain control has been good.  Her appetite is normal, and she is eating and drinking without difficulty.  Bowel movements are regular.  She complains of some itching at the incision site, but has no other complaints today.  Vitals:   09/18/18 0945  BP: (!) 161/108  Pulse: 71  Resp: 14  Temp: 97.7 F (36.5 C)  SpO2: 100%   Focused abdominal examination: Her lower midline incision is well approximated and healing nicely.  There is a small nubbin of absorbable staple present, this was removed with forceps.  There is no evidence of recurret hernia with bilateral leg lift.  Impression and plan: Terri Chambers is a 43 year old woman who underwent a combined operation with myself and the GYN service.  She is healing well from her operation.  She was advised that she may apply topical cortisone cream to the area that is itching and that this should resolve as the incision continues to heal.  It is been 4 weeks since her operation; she should continue to refrain from lifting anything heavier than 10 pounds for another 2 weeks.  She may resume all of her usual activities with this exception.  I will see her on an as-needed basis.

## 2018-09-25 ENCOUNTER — Telehealth: Payer: Self-pay | Admitting: Obstetrics and Gynecology

## 2018-09-25 NOTE — Telephone Encounter (Signed)
The patient called and stated that she is having some problems with her incision, it has been "painful and her stitches are bothering her" Please advise.

## 2018-09-25 NOTE — Telephone Encounter (Signed)
Would you like patient to come in to be checked?

## 2018-09-26 NOTE — Telephone Encounter (Signed)
Please ensure that her incision is not red or having any drainage, and that it is closed.  If no problems there, she can use over the counter lidocaine patches  (4%) and place them over the incision site for relief.   Dr.Maron Stanzione

## 2018-09-27 NOTE — Telephone Encounter (Signed)
LM for patient to return call.

## 2018-09-28 ENCOUNTER — Encounter: Payer: Medicaid Other | Admitting: Obstetrics and Gynecology

## 2018-09-28 NOTE — Telephone Encounter (Signed)
LM for patient to return call.

## 2018-10-02 NOTE — Telephone Encounter (Signed)
LM for patient to return call.

## 2018-10-04 ENCOUNTER — Emergency Department: Payer: Self-pay

## 2018-10-04 ENCOUNTER — Other Ambulatory Visit: Payer: Self-pay

## 2018-10-04 ENCOUNTER — Emergency Department
Admission: EM | Admit: 2018-10-04 | Discharge: 2018-10-04 | Disposition: A | Discharge status: Home / Self Care | Payer: Self-pay | Attending: Emergency Medicine | Admitting: Emergency Medicine

## 2018-10-04 ENCOUNTER — Encounter: Payer: Self-pay | Admitting: Emergency Medicine

## 2018-10-04 DIAGNOSIS — Z79899 Other long term (current) drug therapy: Secondary | ICD-10-CM | POA: Insufficient documentation

## 2018-10-04 DIAGNOSIS — I10 Essential (primary) hypertension: Secondary | ICD-10-CM | POA: Insufficient documentation

## 2018-10-04 DIAGNOSIS — Z87891 Personal history of nicotine dependence: Secondary | ICD-10-CM | POA: Insufficient documentation

## 2018-10-04 DIAGNOSIS — J45909 Unspecified asthma, uncomplicated: Secondary | ICD-10-CM | POA: Insufficient documentation

## 2018-10-04 LAB — COMPREHENSIVE METABOLIC PANEL
ALT: 26 U/L (ref 0–44)
AST: 22 U/L (ref 15–41)
Albumin: 3.8 g/dL (ref 3.5–5.0)
Alkaline Phosphatase: 66 U/L (ref 38–126)
Anion gap: 8 (ref 5–15)
BUN: 10 mg/dL (ref 6–20)
CALCIUM: 9 mg/dL (ref 8.9–10.3)
CO2: 28 mmol/L (ref 22–32)
Chloride: 105 mmol/L (ref 98–111)
Creatinine, Ser: 0.53 mg/dL (ref 0.44–1.00)
Glucose, Bld: 110 mg/dL — ABNORMAL HIGH (ref 70–99)
Potassium: 3.3 mmol/L — ABNORMAL LOW (ref 3.5–5.1)
Sodium: 141 mmol/L (ref 135–145)
Total Bilirubin: 0.6 mg/dL (ref 0.3–1.2)
Total Protein: 7.6 g/dL (ref 6.5–8.1)

## 2018-10-04 LAB — CBC
HCT: 38.6 % (ref 36.0–46.0)
Hemoglobin: 12.7 g/dL (ref 12.0–15.0)
MCH: 27.9 pg (ref 26.0–34.0)
MCHC: 32.9 g/dL (ref 30.0–36.0)
MCV: 84.8 fL (ref 80.0–100.0)
Platelets: 287 10*3/uL (ref 150–400)
RBC: 4.55 MIL/uL (ref 3.87–5.11)
RDW: 13.6 % (ref 11.5–15.5)
WBC: 5.6 10*3/uL (ref 4.0–10.5)
nRBC: 0 % (ref 0.0–0.2)

## 2018-10-04 LAB — TROPONIN I: Troponin I: <0.03 ng/mL (ref <0.03)

## 2018-10-04 MED ORDER — CLONIDINE HCL 0.1 MG PO TABS
0.1000 mg | ORAL_TABLET | Freq: Once | ORAL | Status: AC
  Administered 2018-10-04: 0.1 mg via ORAL
  Filled 2018-10-04: qty 1

## 2018-10-04 MED ORDER — CLONIDINE HCL 0.1 MG PO TABS: 0.1000 mg | ORAL_TABLET | Freq: Two times a day (BID) | ORAL | 1 refills | Status: DC

## 2018-10-04 NOTE — ED Notes (Signed)
See triage note  Presents with concerns with her b/p  States she woke up dizzy this am  Has been taking her meds on regular basis   But over the past few days states meds are not working

## 2018-10-04 NOTE — ED Triage Notes (Signed)
Pt in with co htn states has had problems with the same, is on meds that she takes regularly. States woke up with dizziness and headache states when she checked it at home it was 201/132.

## 2018-10-04 NOTE — ED Provider Notes (Signed)
[facility] Emergency Department Provider Note   ____________________________________________   First MD Initiated Contact with Patient 10/04/18 [phone]     (approximate)  I have reviewed the triage vital signs and the nursing notes.   HISTORY  Chief Complaint Hypertension   HPI Terri Chambers is a 43 y.o. female presents to the ED with concerns of her blood pressure.  Patient states that she woke this morning and felt dizzy.  She immediately took her blood pressure at home and noted that her blood pressure was 201/132.  She states that she took her blood pressure medication both lisinopril 20 mg and atenolol 100 mg.  She states that she has had problems with her blood pressure in the past.  She also has been on hydrochlorothiazide which dropped her potassium so low that she had to be hospitalized.  She also was placed on triamterene and potassium also was low.  She was placed on high doses of potassium so that she could undergo her gynecological surgery that was performed 6 weeks ago.  She has not followed up with her PCP since that time.  She denies any chest pain or shortness of breath at this time.  She is very anxious that her blood pressure is elevated.  Patient denies any pain.  She relates that family member does have history of hypertension.     Past Medical History:  Diagnosis Date  . Anemia    WITH PREGNANCY  . Asthma    AS A CHILD  . Depression   . History of asthma   . History of plasmapheresis   . Hypertension   . Migraine headache   . Myasthenia gravis   . Myasthenia gravis ([place]) 2005  . Obesity     Patient Active Problem List   Diagnosis Date Noted  . S/P myomectomy 08/21/2018  . Essential hypertension 11/05/2017  . Morbid obesity ([place]) 11/05/2017  . Umbilical hernia without obstruction and without gangrene 11/05/2017  . Menorrhagia with regular cycle 11/05/2017  . Right ovarian cyst 11/05/2017  . Uterine leiomyoma 11/05/2017  .  Abnormal electrocardiogram 12/23/2016  . Blurred vision 05/05/2015  . Dysphagia 05/05/2015  . Encounter for therapeutic drug monitoring 10/02/2012  . Myasthenia gravis without exacerbation ([place]) 10/02/2012  . Myasthenia gravis with exacerbation ([place]) 10/02/2012    Past Surgical History:  Procedure Laterality Date  . EAR TUBE REMOVAL    . MYOMECTOMY N/A 08/21/2018   Procedure: ABDOMINAL MYOMECTOMY;  Surgeon: Rubie Maid, MD;  Location: [facility] ORS;  Service: Gynecology;  Laterality: N/A;  . TYMPANOSTOMY TUBE PLACEMENT    . UMBILICAL HERNIA REPAIR N/A 08/21/2018   Procedure: HERNIA REPAIR UMBILICAL ADULT;  Surgeon: Fredirick Maudlin, MD;  Location: [facility] ORS;  Service: General;  Laterality: N/A;    Prior to Admission medications   Medication Sig Start Date End Date Taking? Authorizing Provider  atenolol (TENORMIN) 100 MG tablet Take 100 mg by mouth every morning.     [provider]  cloNIDine (CATAPRES) 0.1 MG tablet Take 1 tablet (0.1 mg total) by mouth 2 (two) times daily. 10/04/18   Johnn Hai, PA-C  ketoconazole (NIZORAL) 2 % shampoo Apply 1 application topically daily as needed for irritation. AS DIRECTED 01/26/17   [provider]  lisinopril (PRINIVIL,ZESTRIL) 20 MG tablet Take 20 mg by mouth every morning.     [provider]    Allergies Patient has no known allergies.  Family History  Problem Relation Age of Onset  .  Diabetes Mother   . Arthritis Father   . Hypertension Father   . Cerebral aneurysm Maternal Grandmother   . Congestive Heart Failure Paternal Grandmother   . Cervical cancer Other        Maternal great-grandmother died of cervical cancer  . Other Neg Hx     Social History Social History   Tobacco Use  . Smoking status: Former Smoker    Packs/day: 0.50    Types: Cigarettes    Last attempt to quit: 08/02/2008    Years since quitting: 10.1  . Smokeless tobacco: Never Used  Substance Use Topics  . Alcohol use: No  . Drug  use: No    Review of Systems Constitutional: No fever/chills Eyes: No visual changes. ENT: No sore throat. Cardiovascular: Denies chest pain. Respiratory: Denies shortness of breath. Gastrointestinal: No abdominal pain.  No nausea, no vomiting. Musculoskeletal: Negative for back pain. Skin: Negative for rash. Neurological: Negative for headaches, focal weakness or numbness. ____________________________________________   PHYSICAL EXAM:  VITAL SIGNS: ED Triage Vitals  Enc Vitals Group     BP 10/04/18 0625 (!) 171/112     Pulse Rate 10/04/18 0625 65     Resp 10/04/18 0625 20     Temp 10/04/18 0625 98.1 F (36.7 C)     Temp Source 10/04/18 0625 Oral     SpO2 10/04/18 0625 97 %     Weight 10/04/18 0626 286 lb (129.7 kg)     Height 10/04/18 0626 5\' 8"  (1.727 m)     Head Circumference --      Peak Flow --      Pain Score 10/04/18 0625 0     Pain Loc --      Pain Edu? --      Excl. in [place]? --    Constitutional: Alert and oriented. Well appearing and in no acute distress. Eyes: Conjunctivae are normal.  Head: Atraumatic. Neck: No stridor.   Cardiovascular: Normal rate, regular rhythm. Grossly normal heart sounds.  Good peripheral circulation. Respiratory: Normal respiratory effort.  No retractions. Lungs CTAB. Gastrointestinal: Soft and nontender. No distention.  Musculoskeletal: Moves upper lower extremities without any difficulty.  No edema lower extremities. Neurologic:  Normal speech and language. No gross focal neurologic deficits are appreciated.  Skin:  Skin is warm, dry and intact. No rash noted. Psychiatric: Mood and affect are normal. Speech and behavior are normal.  ____________________________________________   LABS (all labs ordered are listed, but only abnormal results are displayed)  Labs Reviewed  COMPREHENSIVE METABOLIC PANEL - Abnormal; Notable for the following components:      Result Value   Potassium 3.3 (*)    Glucose, Bld 110 (*)    All other  components within normal limits  CBC  TROPONIN I   ____________________________________________  EKG  Reviewed by Dr. Owens Shark NSR with ventricular rate of 63.  PR interval 176, QRS duration 86. Voltage criteria for left ventricular hypertrophy ____________________________________________  RADIOLOGY  Official radiology report(s): Dg Chest 2 View  Result Date: 10/04/2018 CLINICAL DATA:  Hypertension.  No chest complaints. EXAM: CHEST - 2 VIEW COMPARISON:  CT chest and chest x-ray dated February 19, 2017. FINDINGS: New mild cardiomegaly. Normal mediastinal contours. Normal pulmonary vascularity. No focal consolidation, pleural effusion, or pneumothorax. No acute osseous abnormality. IMPRESSION: 1. New mild cardiomegaly.  No active cardiopulmonary disease. Electronically Signed   By: Titus Dubin M.D.   On: 10/04/2018 09:33    ____________________________________________   PROCEDURES  Procedure(s) performed (including  Critical Care):  Procedures  ___________________________________________   INITIAL IMPRESSION / ASSESSMENT AND PLAN / ED COURSE  As part of my medical decision making, I reviewed the following data within the electronic MEDICAL RECORD NUMBER Notes from prior ED visits and [place] Controlled Substance Database  Patient presents to the ED with complaint of hypertension and a blood pressure reading this morning of 201/132 and dizziness at home.  Patient has had difficulty with her hypertension and each time she has put on a diuretic she has bottomed out with her potassium and at one time had to be hospitalized for hypokalemia.  Patient has been on hydrochlorothiazide and triamterene with similar outcomes.  Currently she is taking lisinopril and atenolol.  Initial blood pressure in the ED was 171/112.  Prior to discharge patient's blood pressure was 156/110.  We discussed options and patient was placed on Catapres 0.1 mg twice daily until she is able to follow-up with her PCP.  She is  reassured that this medication is not a diuretic and would not drop her potassium.  Lab work, EKG and chest x-ray were discussed with her.  She is encouraged to call today to make an appointment with her PCP for further evaluation of her hypertension and continued management. ____________________________________________   FINAL CLINICAL IMPRESSION(S) / ED DIAGNOSES  Final diagnoses:  Hypertension, unspecified type     ED Discharge Orders         Ordered    cloNIDine (CATAPRES) 0.1 MG tablet  2 times daily     10/04/18 1028           Note:  This document was prepared using Dragon voice recognition software and may include unintentional dictation errors.    Johnn Hai, PA-C 10/04/18 1732    Lavonia Drafts, MD 10/05/18 1146

## 2018-10-04 NOTE — Discharge Instructions (Signed)
Continue taking your blood pressure medication as prescribed by your doctor and begin taking clonidine 0.1 mg twice a day. Call make an appointment for your doctor to recheck your blood pressure next week.

## 2018-10-05 NOTE — Telephone Encounter (Signed)
Patient never returned call  

## 2018-11-03 ENCOUNTER — Telehealth: Payer: Self-pay | Admitting: Obstetrics and Gynecology

## 2018-11-03 NOTE — Telephone Encounter (Signed)
Patient called requesting a note to be out of work . 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 She states she has pre existing conditions and feels at risk doing her job. She would like a call back.Thanks

## 2018-11-03 NOTE — Telephone Encounter (Signed)
Called pt no answer LM to contact her PCP since they would know more about her pre-existing conditions and would know if she needed to be out of work or not.

## 2019-10-03 ENCOUNTER — Ambulatory Visit: Payer: Medicaid Other | Attending: Internal Medicine

## 2019-10-03 DIAGNOSIS — Z20822 Contact with and (suspected) exposure to covid-19: Secondary | ICD-10-CM

## 2019-10-04 LAB — NOVEL CORONAVIRUS, NAA: SARS-CoV-2, NAA: DETECTED — AB

## 2019-10-05 ENCOUNTER — Ambulatory Visit (HOSPITAL_COMMUNITY)
Admission: RE | Admit: 2019-10-05 | Discharge: 2019-10-05 | Disposition: A | Discharge status: Home / Self Care | Payer: HRSA Program | Source: Ambulatory Visit | Attending: Pulmonary Disease | Admitting: Pulmonary Disease

## 2019-10-05 ENCOUNTER — Other Ambulatory Visit: Payer: Self-pay | Admitting: Physician Assistant

## 2019-10-05 ENCOUNTER — Encounter: Payer: Self-pay | Admitting: Physician Assistant

## 2019-10-05 DIAGNOSIS — U071 COVID-19: Secondary | ICD-10-CM | POA: Insufficient documentation

## 2019-10-05 DIAGNOSIS — G7000 Myasthenia gravis without (acute) exacerbation: Secondary | ICD-10-CM | POA: Diagnosis present

## 2019-10-05 DIAGNOSIS — I10 Essential (primary) hypertension: Secondary | ICD-10-CM | POA: Diagnosis present

## 2019-10-05 HISTORY — DX: Morbid (severe) obesity due to excess calories: E66.01

## 2019-10-05 MED ORDER — SODIUM CHLORIDE 0.9 % IV SOLN
700.0000 mg | Freq: Once | INTRAVENOUS | Status: AC
  Administered 2019-10-05: 700 mg via INTRAVENOUS
  Filled 2019-10-05: qty 20

## 2019-10-05 MED ORDER — FAMOTIDINE IN NACL 20-0.9 MG/50ML-% IV SOLN: 20.0000 mg | Freq: Once | INTRAVENOUS | Status: DC | PRN

## 2019-10-05 MED ORDER — EPINEPHRINE 0.3 MG/0.3ML IJ SOAJ: 0.3000 mg | Freq: Once | INTRAMUSCULAR | Status: DC | PRN

## 2019-10-05 MED ORDER — ALBUTEROL SULFATE HFA 108 (90 BASE) MCG/ACT IN AERS: 2.0000 | INHALATION_SPRAY | Freq: Once | RESPIRATORY_TRACT | Status: DC | PRN

## 2019-10-05 MED ORDER — METHYLPREDNISOLONE SODIUM SUCC 125 MG IJ SOLR: 125.0000 mg | Freq: Once | INTRAMUSCULAR | Status: DC | PRN

## 2019-10-05 MED ORDER — DIPHENHYDRAMINE HCL 50 MG/ML IJ SOLN: 50.0000 mg | Freq: Once | INTRAMUSCULAR | Status: DC | PRN

## 2019-10-05 MED ORDER — SODIUM CHLORIDE 0.9 % IV SOLN: INTRAVENOUS | Status: DC | PRN

## 2019-10-05 NOTE — Progress Notes (Signed)
  I connected by phone with Norina Buzzard on 10/05/2019 at 8:30 AM to discuss the potential use of an new treatment for mild to moderate COVID-19 viral infection in non-hospitalized patients.  This patient is a 44 y.o. female that meets the FDA criteria for Emergency Use Authorization of bamlanivimab or casirivimab\imdevimab.  Has a (+) direct SARS-CoV-2 viral test result  Has mild or moderate COVID-19   Is ? 42 years of age and weighs ? 40 kg  Is NOT hospitalized due to COVID-19  Is NOT requiring oxygen therapy or requiring an increase in baseline oxygen flow rate due to COVID-19  Is within 10 days of symptom onset  Has at least one of the high risk factor(s) for progression to severe COVID-19 and/or hospitalization as defined in EUA.  Specific high risk criteria : BMI >/= 35, HTN, myasthenia gravis   I have spoken and communicated the following to the patient or parent/caregiver:  1. FDA has authorized the emergency use of bamlanivimab and casirivimab\imdevimab for the treatment of mild to moderate COVID-19 in adults and pediatric patients with positive results of direct SARS-CoV-2 viral testing who are 35 years of age and older weighing at least 40 kg, and who are at high risk for progressing to severe COVID-19 and/or hospitalization.  2. The significant known and potential risks and benefits of bamlanivimab and casirivimab\imdevimab, and the extent to which such potential risks and benefits are unknown.  3. Information on available alternative treatments and the risks and benefits of those alternatives, including clinical trials.  4. Patients treated with bamlanivimab and casirivimab\imdevimab should continue to self-isolate and use infection control measures (e.g., wear mask, isolate, social distance, avoid sharing personal items, clean and disinfect "high touch" surfaces, and frequent handwashing) according to CDC guidelines.   5. The patient or parent/caregiver has the option to  accept or refuse bamlanivimab or casirivimab\imdevimab .  After reviewing this information with the patient, The patient agreed to proceed with receiving the bamlanimivab infusion and will be provided a copy of the Fact sheet prior to receiving the infusion.   Sx onset 3/1. Set up for infusion today at 12:30pm. Directions given.   Angelena Form 10/05/2019 8:30 AM

## 2019-10-05 NOTE — Progress Notes (Signed)
  Diagnosis: COVID-19  Physician: Dr. Tia Masker  Procedure: [facility] Infusion Clinic Med: bamlanivimab infusion - Provided patient with bamlanimivab fact sheet for patients, parents and caregivers prior to infusion.  Complications: No immediate complications noted.  Discharge: Discharged home   Janine Ores 10/05/2019

## 2019-10-05 NOTE — Discharge Instructions (Signed)

## 2019-12-25 ENCOUNTER — Ambulatory Visit: Payer: Medicaid Other

## 2019-12-25 DIAGNOSIS — Z23 Encounter for immunization: Secondary | ICD-10-CM

## 2019-12-25 NOTE — Progress Notes (Signed)
   Covid-19 Vaccination Clinic  Name:  [name]    MRN: [id number] DOB: [birth year]  12/25/2019  Terri Chambers was observed post Covid-19 immunization for 15 minutes without incident. She was provided with Vaccine Information Sheet and instruction to access the V-Safe system.   Terri Chambers was instructed to call 911 with any severe reactions post vaccine: Marland Kitchen Difficulty breathing  . Swelling of face and throat  . A fast heartbeat  . A bad rash all over body  . Dizziness and weakness   Immunizations Administered    Name Date Dose VIS Date Route   Pfizer COVID-19 Vaccine 12/25/2019  6:08 PM 0.3 mL 09/26/2018 Intramuscular   Manufacturer: [place]   Lot: [id number]   [place]: [id number]

## 2020-01-15 ENCOUNTER — Ambulatory Visit: Payer: Medicaid Other | Attending: Internal Medicine

## 2020-01-15 DIAGNOSIS — Z23 Encounter for immunization: Secondary | ICD-10-CM

## 2020-01-15 NOTE — Progress Notes (Signed)
   Covid-19 Vaccination Clinic  Name:  LYNZEE LINDQUIST    MRN: [id number] DOB: Jul 07, 1976  01/15/2020  Ms. Woodmansee was observed post Covid-19 immunization for 15 minutes without incident. She was provided with Vaccine Information Sheet and instruction to access the V-Safe system.   Ms. Germer was instructed to call 911 with any severe reactions post vaccine: Marland Kitchen Difficulty breathing  . Swelling of face and throat  . A fast heartbeat  . A bad rash all over body  . Dizziness and weakness   Immunizations Administered    Name Date Dose VIS Date Route   Pfizer COVID-19 Vaccine 01/15/2020  4:53 PM 0.3 mL 09/26/2018 Intramuscular   Manufacturer: [place]   Lot: [id number]   [place]: [id number]

## 2020-02-29 ENCOUNTER — Ambulatory Visit (LOCAL_COMMUNITY_HEALTH_CENTER): Payer: Self-pay

## 2020-02-29 ENCOUNTER — Other Ambulatory Visit: Payer: Self-pay

## 2020-02-29 DIAGNOSIS — Z111 Encounter for screening for respiratory tuberculosis: Secondary | ICD-10-CM

## 2020-02-29 DIAGNOSIS — Z23 Encounter for immunization: Secondary | ICD-10-CM

## 2020-02-29 NOTE — Progress Notes (Signed)
Presents to [facility] for TST and Hepatitis B with reimbursement form [employer]). Services client to receive not indicated on reimbursement form. Call to agency and verified with Anne Ng that [employer] will reimburse for TST and Hepatitis B. Rich Number, RN

## 2020-03-03 ENCOUNTER — Ambulatory Visit (LOCAL_COMMUNITY_HEALTH_CENTER): Payer: Medicaid Other

## 2020-03-03 ENCOUNTER — Other Ambulatory Visit: Payer: Self-pay

## 2020-03-03 DIAGNOSIS — Z111 Encounter for screening for respiratory tuberculosis: Secondary | ICD-10-CM

## 2020-03-03 LAB — TB SKIN TEST
Induration: 0 mm
TB Skin Test: NEGATIVE

## 2020-09-23 ENCOUNTER — Other Ambulatory Visit: Payer: Self-pay

## 2020-09-23 DIAGNOSIS — I10 Essential (primary) hypertension: Secondary | ICD-10-CM | POA: Insufficient documentation

## 2020-09-23 DIAGNOSIS — J45909 Unspecified asthma, uncomplicated: Secondary | ICD-10-CM | POA: Insufficient documentation

## 2020-09-23 DIAGNOSIS — Z87891 Personal history of nicotine dependence: Secondary | ICD-10-CM | POA: Insufficient documentation

## 2020-09-23 DIAGNOSIS — R040 Epistaxis: Secondary | ICD-10-CM | POA: Insufficient documentation

## 2020-09-23 DIAGNOSIS — Z79899 Other long term (current) drug therapy: Secondary | ICD-10-CM | POA: Insufficient documentation

## 2020-09-23 NOTE — ED Triage Notes (Signed)
Pt states she blew her nose tonight and her nose started bleeding and has been since, pt states blood is only coming from left nostril. Pt denies headache or pain anywhere

## 2020-09-24 ENCOUNTER — Emergency Department
Admission: EM | Admit: 2020-09-24 | Discharge: 2020-09-24 | Disposition: A | Discharge status: Home / Self Care | Payer: Medicaid Other | Attending: Emergency Medicine | Admitting: Emergency Medicine

## 2020-09-24 DIAGNOSIS — R040 Epistaxis: Secondary | ICD-10-CM

## 2020-09-24 DIAGNOSIS — I10 Essential (primary) hypertension: Secondary | ICD-10-CM

## 2020-09-24 MED ORDER — LOSARTAN POTASSIUM 50 MG PO TABS
50.0000 mg | ORAL_TABLET | Freq: Once | ORAL | Status: AC
  Administered 2020-09-24: 50 mg via ORAL
  Filled 2020-09-24: qty 1

## 2020-09-24 MED ORDER — LOSARTAN POTASSIUM 100 MG PO TABS: 100.0000 mg | ORAL_TABLET | Freq: Every day | ORAL | 0 refills | Status: DC

## 2020-09-24 NOTE — ED Provider Notes (Signed)
[facility] Emergency Department Provider Note   ____________________________________________   Event Date/Time   First MD Initiated Contact with Patient 09/24/20 [phone]     (approximate)  I have reviewed the triage vital signs and the nursing notes.   HISTORY  Chief Complaint Epistaxis    HPI Terri Chambers is a 45 y.o. female with a stated past medical history of hypertension who presents for a nosebleed that began approximately 3 hours prior to arrival.  Patient states that she held pressure as well as placed ice to the bridge of her nose which improved to the symptoms but whenever she took the ice pack off it recurred.  Patient is hemostatic at this time and states that "eventually just stopped".  Patient is also very concerned that her blood pressure is elevated and feels that this is a reason for her nosebleeds as this is happened previously.  Patient denies any recent medication nonadherence or medication changes.  Patient currently denies any vision changes, tinnitus, difficulty speaking, facial droop, sore throat, chest pain, shortness of breath, abdominal pain, nausea/vomiting/diarrhea, dysuria, or weakness/numbness/paresthesias in any extremity        Past Medical History:  Diagnosis Date  . Anemia    WITH PREGNANCY  . Asthma    AS A CHILD  . Depression   . History of asthma   . History of plasmapheresis   . Hypertension   . Migraine headache   . Morbid obesity ([place])   . Myasthenia gravis   . Myasthenia gravis [facility]) 2005    Patient Active Problem List   Diagnosis Date Noted  . S/P myomectomy 08/21/2018  . Essential hypertension 11/05/2017  . Morbid obesity ([place]) 11/05/2017  . Umbilical hernia without obstruction and without gangrene 11/05/2017  . Menorrhagia with regular cycle 11/05/2017  . Right ovarian cyst 11/05/2017  . Uterine leiomyoma 11/05/2017  . Abnormal electrocardiogram 12/23/2016  . Blurred vision 05/05/2015  .  Dysphagia 05/05/2015  . Encounter for therapeutic drug monitoring 10/02/2012  . Myasthenia gravis without exacerbation ([place]) 10/02/2012  . Myasthenia gravis with exacerbation ([place]) 10/02/2012    Past Surgical History:  Procedure Laterality Date  . EAR TUBE REMOVAL    . MYOMECTOMY N/A 08/21/2018   Procedure: ABDOMINAL MYOMECTOMY;  Surgeon: Rubie Maid, MD;  Location: [facility] ORS;  Service: Gynecology;  Laterality: N/A;  . TYMPANOSTOMY TUBE PLACEMENT    . UMBILICAL HERNIA REPAIR N/A 08/21/2018   Procedure: HERNIA REPAIR UMBILICAL ADULT;  Surgeon: Fredirick Maudlin, MD;  Location: [facility] ORS;  Service: General;  Laterality: N/A;    Prior to Admission medications   Medication Sig Start Date End Date Taking? Authorizing Provider  losartan (COZAAR) 100 MG tablet Take 1 tablet (100 mg total) by mouth daily. 09/24/20  Yes Naaman Plummer, MD  atenolol (TENORMIN) 100 MG tablet Take 100 mg by mouth every morning.     [provider]  cloNIDine (CATAPRES) 0.1 MG tablet Take 1 tablet (0.1 mg total) by mouth 2 (two) times daily. 10/04/18   Johnn Hai, PA-C  ketoconazole (NIZORAL) 2 % shampoo Apply 1 application topically daily as needed for irritation. AS DIRECTED 01/26/17   [provider]    Allergies Patient has no known allergies.  Family History  Problem Relation Age of Onset  . Diabetes Mother   . Arthritis Father   . Hypertension Father   . Cerebral aneurysm Maternal Grandmother   . Congestive Heart Failure Paternal Grandmother   . Cervical cancer Other  Maternal great-grandmother died of cervical cancer  . Other Neg Hx     Social History Social History   Tobacco Use  . Smoking status: Former Smoker    Packs/day: 0.50    Types: Cigarettes    Quit date: 08/02/2008    Years since quitting: 12.1  . Smokeless tobacco: Never Used  Vaping Use  . Vaping Use: Never used  Substance Use Topics  . Alcohol use: No  . Drug use: No    Review of  Systems Constitutional: No fever/chills Eyes: No visual changes. ENT: No sore throat.  Endorses epistaxis Cardiovascular: Denies chest pain. Respiratory: Denies shortness of breath. Gastrointestinal: No abdominal pain.  No nausea, no vomiting.  No diarrhea. Genitourinary: Negative for dysuria. Musculoskeletal: Negative for acute arthralgias Skin: Negative for rash. Neurological: Negative for headaches, weakness/numbness/paresthesias in any extremity Psychiatric: Negative for suicidal ideation/homicidal ideation   ____________________________________________   PHYSICAL EXAM:  VITAL SIGNS: ED Triage Vitals  Enc Vitals Group     BP 09/23/20 2210 (!) 211/125     Pulse Rate 09/23/20 2208 72     Resp 09/23/20 2208 16     Temp 09/23/20 2208 98.7 F (37.1 C)     Temp Source 09/23/20 2208 Oral     SpO2 09/23/20 2208 96 %     Weight 09/23/20 2206 290 lb (131.5 kg)     Height 09/23/20 2206 5\' 8"  (1.727 m)     Head Circumference --      Peak Flow --      Pain Score 09/23/20 2206 0     Pain Loc --      Pain Edu? --      Excl. in [place]? --    Constitutional: Alert and oriented. Well appearing and in no acute distress. Eyes: Conjunctivae are normal. PERRL. Head: Atraumatic. Nose: No congestion/rhinnorhea.  Hemostatic with dried blood in the left nare Mouth/Throat: Mucous membranes are moist. Neck: No stridor Cardiovascular: Grossly normal heart sounds.  Good peripheral circulation. Respiratory: Normal respiratory effort.  No retractions. Gastrointestinal: Soft and nontender. No distention. Musculoskeletal: No obvious deformities Neurologic:  Normal speech and language. No gross focal neurologic deficits are appreciated. Skin:  Skin is warm and dry. No rash noted. Psychiatric: Mood and affect are normal. Speech and behavior are normal.  ____________________________________________   LABS (all labs ordered are listed, but only abnormal results are displayed)  Labs Reviewed - No  data to display  PROCEDURES  Procedure(s) performed (including Critical Care):  .1-3 Lead EKG Interpretation Performed by: Naaman Plummer, MD Authorized by: Naaman Plummer, MD     Interpretation: normal     ECG rate:  70   ECG rate assessment: normal     Rhythm: sinus rhythm     Ectopy: none     Conduction: normal       ____________________________________________   INITIAL IMPRESSION / ASSESSMENT AND PLAN / ED COURSE  As part of my medical decision making, I reviewed the following data within the [place] notes reviewed and incorporated, Labs reviewed, Old chart reviewed, and Notes from prior ED visits reviewed and incorporated        The bleeding source appeared to be anterior in origin. Blood clots were cleared by having the patient blow them out into a towel. After this, a nasal clamp was applied for about 10 minutes. Upon removing the nasal clamp, the bleeding stopped.  Patient remained hypertensive throughout her emergency department course and therefore was  given a losartan prior to discharge and encouraged to follow-up with a primary care provider for further management of her antihypertensive regimen.      ____________________________________________   FINAL CLINICAL IMPRESSION(S) / ED DIAGNOSES  Final diagnoses:  Anterior epistaxis  Primary hypertension     ED Discharge Orders         Ordered    losartan (COZAAR) 100 MG tablet  Daily        09/24/20 0151           Note:  This document was prepared using Dragon voice recognition software and may include unintentional dictation errors.   Naaman Plummer, MD 09/24/20 [phone]

## 2020-10-22 ENCOUNTER — Emergency Department (HOSPITAL_COMMUNITY): Payer: Self-pay

## 2020-10-22 ENCOUNTER — Other Ambulatory Visit: Payer: Self-pay

## 2020-10-22 ENCOUNTER — Emergency Department (HOSPITAL_COMMUNITY)
Admission: EM | Admit: 2020-10-22 | Discharge: 2020-10-22 | Disposition: A | Discharge status: Home / Self Care | Payer: Self-pay | Attending: Emergency Medicine | Admitting: Emergency Medicine

## 2020-10-22 DIAGNOSIS — R519 Headache, unspecified: Secondary | ICD-10-CM | POA: Insufficient documentation

## 2020-10-22 DIAGNOSIS — R3589 Other polyuria: Secondary | ICD-10-CM | POA: Insufficient documentation

## 2020-10-22 DIAGNOSIS — J45909 Unspecified asthma, uncomplicated: Secondary | ICD-10-CM | POA: Insufficient documentation

## 2020-10-22 DIAGNOSIS — Z79899 Other long term (current) drug therapy: Secondary | ICD-10-CM | POA: Insufficient documentation

## 2020-10-22 DIAGNOSIS — Z87891 Personal history of nicotine dependence: Secondary | ICD-10-CM | POA: Insufficient documentation

## 2020-10-22 DIAGNOSIS — I10 Essential (primary) hypertension: Secondary | ICD-10-CM | POA: Insufficient documentation

## 2020-10-22 LAB — URINALYSIS, ROUTINE W REFLEX MICROSCOPIC
Bilirubin Urine: NEGATIVE
Glucose, UA: NEGATIVE mg/dL
Hgb urine dipstick: NEGATIVE
Ketones, ur: NEGATIVE mg/dL
Nitrite: NEGATIVE
Protein, ur: NEGATIVE mg/dL
Specific Gravity, Urine: 1.009 (ref 1.005–1.030)
pH: 7 (ref 5.0–8.0)

## 2020-10-22 LAB — BASIC METABOLIC PANEL
Anion gap: 10 (ref 5–15)
BUN: 10 mg/dL (ref 6–20)
CO2: 23 mmol/L (ref 22–32)
Calcium: 9 mg/dL (ref 8.9–10.3)
Chloride: 104 mmol/L (ref 98–111)
Creatinine, Ser: 0.65 mg/dL (ref 0.44–1.00)
GFR, Estimated: >60 mL/min (ref >60)
Glucose, Bld: 87 mg/dL (ref 70–99)
Potassium: 3.3 mmol/L — ABNORMAL LOW (ref 3.5–5.1)
Sodium: 137 mmol/L (ref 135–145)

## 2020-10-22 LAB — CBC WITH DIFFERENTIAL/PLATELET
Abs Immature Granulocytes: 0.01 10*3/uL (ref 0.00–0.07)
Basophils Absolute: 0 10*3/uL (ref 0.0–0.1)
Basophils Relative: 0 %
Eosinophils Absolute: 0.1 10*3/uL (ref 0.0–0.5)
Eosinophils Relative: 1 %
HCT: 39.9 % (ref 36.0–46.0)
Hemoglobin: 13 g/dL (ref 12.0–15.0)
Immature Granulocytes: 0 %
Lymphocytes Relative: 34 %
Lymphs Abs: 1.9 10*3/uL (ref 0.7–4.0)
MCH: 28.6 pg (ref 26.0–34.0)
MCHC: 32.6 g/dL (ref 30.0–36.0)
MCV: 87.9 fL (ref 80.0–100.0)
Monocytes Absolute: 0.5 10*3/uL (ref 0.1–1.0)
Monocytes Relative: 8 %
Neutro Abs: 3.2 10*3/uL (ref 1.7–7.7)
Neutrophils Relative %: 57 %
Platelets: 318 10*3/uL (ref 150–400)
RBC: 4.54 MIL/uL (ref 3.87–5.11)
RDW: 13.2 % (ref 11.5–15.5)
WBC: 5.7 10*3/uL (ref 4.0–10.5)
nRBC: 0 % (ref 0.0–0.2)

## 2020-10-22 LAB — TROPONIN I (HIGH SENSITIVITY)
Troponin I (High Sensitivity): <2 ng/L (ref <18)
Troponin I (High Sensitivity): <2 ng/L (ref <18)

## 2020-10-22 LAB — I-STAT BETA HCG BLOOD, ED (MC, WL, AP ONLY): I-stat hCG, quantitative: <5 m[IU]/mL (ref <5)

## 2020-10-22 MED ORDER — METOCLOPRAMIDE HCL 5 MG/ML IJ SOLN
10.0000 mg | Freq: Once | INTRAMUSCULAR | Status: AC
  Administered 2020-10-22: 10 mg via INTRAVENOUS
  Filled 2020-10-22: qty 2

## 2020-10-22 MED ORDER — KETOROLAC TROMETHAMINE 30 MG/ML IJ SOLN
30.0000 mg | Freq: Once | INTRAMUSCULAR | Status: AC
  Administered 2020-10-22: 30 mg via INTRAVENOUS
  Filled 2020-10-22: qty 1

## 2020-10-22 MED ORDER — POTASSIUM CHLORIDE CRYS ER 20 MEQ PO TBCR
40.0000 meq | EXTENDED_RELEASE_TABLET | Freq: Once | ORAL | Status: AC
  Administered 2020-10-22: 40 meq via ORAL
  Filled 2020-10-22: qty 2

## 2020-10-22 MED ORDER — DIPHENHYDRAMINE HCL 50 MG/ML IJ SOLN
25.0000 mg | Freq: Once | INTRAMUSCULAR | Status: AC
  Administered 2020-10-22: 25 mg via INTRAVENOUS
  Filled 2020-10-22: qty 1

## 2020-10-22 MED ORDER — CLONIDINE HCL 0.1 MG PO TABS: 0.1000 mg | ORAL_TABLET | Freq: Two times a day (BID) | ORAL | 0 refills | Status: DC

## 2020-10-22 MED ORDER — CLONIDINE HCL 0.1 MG PO TABS
0.1000 mg | ORAL_TABLET | Freq: Once | ORAL | Status: AC
  Administered 2020-10-22: 0.1 mg via ORAL
  Filled 2020-10-22: qty 1

## 2020-10-22 NOTE — ED Provider Notes (Signed)
[facility] [place] DEPT Provider Note   CSN: [id number] Arrival date & time: 10/22/20  1428     History Chief Complaint  Patient presents with  . Hypertension    Terri Chambers is a 45 y.o. female.  HPI   Pt is a 45 y/o female with ah /o anemia, asthma, HTN, migraines, morbid obesity, myasthenia gravis, who presents to the ED today for eval of HTN. States that she was seen in the ED 1 month ago for nosebleed and had elevated BP at that time. At that time she had bp meds changed, however recently her BP seems to still be high. She has tried exercise and dietary changes without improvement.   She reports a headache that started several hours ago that feels similar to her prior migraines. Reports pain on the right side of the head. Rates pain 9/10. Has not tried anything for pain.   She reports associated shortness of breath as well. She had some chest pain last night but does not have any today. Denies diaphoresis but reports associated nausea.  Denies numbness, weakness. Reports associated photosensitivity, polyuria,   Losartan, atenolol, clonidine PCP: Dr. Arrie Senate - [facility]  Past Medical History:  Diagnosis Date  . Anemia    WITH PREGNANCY  . Asthma    AS A CHILD  . Depression   . History of asthma   . History of plasmapheresis   . Hypertension   . Migraine headache   . Morbid obesity ([place])   . Myasthenia gravis   . Myasthenia gravis [facility]) 2005    Patient Active Problem List   Diagnosis Date Noted  . S/P myomectomy 08/21/2018  . Essential hypertension 11/05/2017  . Morbid obesity ([place]) 11/05/2017  . Umbilical hernia without obstruction and without gangrene 11/05/2017  . Menorrhagia with regular cycle 11/05/2017  . Right ovarian cyst 11/05/2017  . Uterine leiomyoma 11/05/2017  . Abnormal electrocardiogram 12/23/2016  . Blurred vision 05/05/2015  . Dysphagia 05/05/2015  . Encounter for therapeutic drug monitoring  10/02/2012  . Myasthenia gravis without exacerbation ([place]) 10/02/2012  . Myasthenia gravis with exacerbation ([place]) 10/02/2012    Past Surgical History:  Procedure Laterality Date  . EAR TUBE REMOVAL    . MYOMECTOMY N/A 08/21/2018   Procedure: ABDOMINAL MYOMECTOMY;  Surgeon: Rubie Maid, MD;  Location: [facility] ORS;  Service: Gynecology;  Laterality: N/A;  . TYMPANOSTOMY TUBE PLACEMENT    . UMBILICAL HERNIA REPAIR N/A 08/21/2018   Procedure: HERNIA REPAIR UMBILICAL ADULT;  Surgeon: Fredirick Maudlin, MD;  Location: [facility] ORS;  Service: General;  Laterality: N/A;     OB History    Gravida  1   Para  1   Term  1   Preterm      AB      Living  1     SAB      IAB      Ectopic      Multiple      Live Births              Family History  Problem Relation Age of Onset  . Diabetes Mother   . Arthritis Father   . Hypertension Father   . Cerebral aneurysm Maternal Grandmother   . Congestive Heart Failure Paternal Grandmother   . Cervical cancer Other        Maternal great-grandmother died of cervical cancer  . Other Neg Hx     Social History   Tobacco Use  .  Smoking status: Former Smoker    Packs/day: 0.50    Types: Cigarettes    Quit date: 08/02/2008    Years since quitting: 12.2  . Smokeless tobacco: Never Used  Vaping Use  . Vaping Use: Never used  Substance Use Topics  . Alcohol use: No  . Drug use: No    Home Medications Prior to Admission medications   Medication Sig Start Date End Date Taking? Authorizing Provider  atenolol (TENORMIN) 100 MG tablet Take 100 mg by mouth every morning.    Yes [provider]  cloNIDine (CATAPRES) 0.1 MG tablet Take 1 tablet (0.1 mg total) by mouth 2 (two) times daily. 10/22/20 11/21/20 Yes Mistie Adney S, PA-C  cloNIDine (CATAPRES) 0.3 MG tablet Take 0.3 mg by mouth 2 (two) times daily. 09/08/20  Yes [provider]  losartan (COZAAR) 100 MG tablet Take 1 tablet (100 mg total) by mouth daily. 09/24/20   Yes Bradler, Vista Lawman, MD  OZEMPIC, 0.25 OR 0.5 MG/DOSE, 2 MG/1.5ML SOPN Inject 0.25 mg into the skin once a week. 10/08/20  Yes [provider]    Allergies    Patient has no known allergies.  Review of Systems   Review of Systems  Constitutional: Negative for fever.  HENT: Negative for ear pain and sore throat.   Eyes: Positive for photophobia.  Respiratory: Negative for cough and shortness of breath.   Cardiovascular: Negative for chest pain.  Gastrointestinal: Negative for abdominal pain, constipation, diarrhea, nausea and vomiting.  Genitourinary: Negative for dysuria and hematuria.  Musculoskeletal: Negative for back pain.  Skin: Negative for rash.  Neurological: Positive for headaches. Negative for weakness and numbness.  All other systems reviewed and are negative.   Physical Exam Updated Vital Signs BP (!) 173/127 (BP Location: Right Arm)   Pulse 74   Temp 98.3 F (36.8 C) (Oral)   Resp 17   Ht 5\' 8"  (1.727 m)   Wt 133.4 kg   SpO2 99%   BMI 44.70 kg/m   Physical Exam Vitals and nursing note reviewed.  Constitutional:      General: She is not in acute distress.    Appearance: She is well-developed.  HENT:     Head: Normocephalic and atraumatic.  Eyes:     Conjunctiva/sclera: Conjunctivae normal.  Cardiovascular:     Rate and Rhythm: Normal rate and regular rhythm.     Heart sounds: Normal heart sounds. No murmur heard.   Pulmonary:     Effort: Pulmonary effort is normal. No respiratory distress.     Breath sounds: Normal breath sounds. No wheezing, rhonchi or rales.  Abdominal:     General: Bowel sounds are normal.     Palpations: Abdomen is soft.     Tenderness: There is no abdominal tenderness. There is no guarding or rebound.  Musculoskeletal:     Cervical back: Neck supple.  Skin:    General: Skin is warm and dry.  Neurological:     Mental Status: She is alert.     Comments: Mental Status:  Alert, thought content appropriate, able to  give a coherent history. Speech fluent without evidence of aphasia. Able to follow 2 step commands without difficulty.  Cranial Nerves:  II:  Peripheral visual fields grossly normal, pupils equal, round, reactive to light III,IV, VI: ptosis not present, extra-ocular motions intact bilaterally  V,VII: smile symmetric, facial light touch sensation equal VIII: hearing grossly normal to voice  X: uvula elevates symmetrically  XI: bilateral shoulder shrug symmetric  and strong XII: midline tongue extension without fassiculations Motor:  Normal tone. 5/5 strength of BUE and BLE major muscle groups including strong and equal grip strength and dorsiflexion/plantar flexion Sensory: light touch normal in all extremities. Gait: normal gait and balance.  Neg pronator drift      ED Results / Procedures / Treatments   Labs (all labs ordered are listed, but only abnormal results are displayed) Labs Reviewed  BASIC METABOLIC PANEL - Abnormal; Notable for the following components:      Result Value   Potassium 3.3 (*)    All other components within normal limits  URINALYSIS, ROUTINE W REFLEX MICROSCOPIC - Abnormal; Notable for the following components:   Leukocytes,Ua TRACE (*)    Bacteria, UA RARE (*)    All other components within normal limits  CBC WITH DIFFERENTIAL/PLATELET  I-STAT BETA HCG BLOOD, ED ([facility], [facility], [facility] ONLY)  TROPONIN I (HIGH SENSITIVITY)  TROPONIN I (HIGH SENSITIVITY)    EKG None  Radiology CT Head Wo Contrast  Result Date: 10/22/2020 CLINICAL DATA:  Migraine headaches for several hours EXAM: CT HEAD WITHOUT CONTRAST TECHNIQUE: Contiguous axial images were obtained from the base of the skull through the vertex without intravenous contrast. COMPARISON:  05/24/2017 FINDINGS: Brain: No evidence of acute infarction, hemorrhage, hydrocephalus, extra-axial collection or mass lesion/mass effect. Area of decreased attenuation is noted in the left cerebellar hemisphere but appears to be  related to beam hardening artifact as it was present on prior exam. Vascular: No hyperdense vessel or unexpected calcification. Skull: Normal. Negative for fracture or focal lesion. Sinuses/Orbits: No acute finding. Other: None. IMPRESSION: No acute intracranial abnormality noted. Electronically Signed   By: Inez Catalina M.D.   On: 10/22/2020 15:49   DG Chest Portable 1 View  Result Date: 10/22/2020 CLINICAL DATA:  Shortness of breath EXAM: PORTABLE CHEST 1 VIEW COMPARISON:  10/04/2018 FINDINGS: The heart size and mediastinal contours are within normal limits. Both lungs are clear. The visualized skeletal structures are unremarkable. IMPRESSION: No active disease. Electronically Signed   By: Inez Catalina M.D.   On: 10/22/2020 15:37    Procedures Procedures   Medications Ordered in ED Medications  metoCLOPramide (REGLAN) injection 10 mg (10 mg Intravenous Given 10/22/20 1604)  diphenhydrAMINE (BENADRYL) injection 25 mg (25 mg Intravenous Given 10/22/20 1604)  cloNIDine (CATAPRES) tablet 0.1 mg (0.1 mg Oral Given 10/22/20 1728)  ketorolac (TORADOL) 30 MG/ML injection 30 mg (30 mg Intravenous Given 10/22/20 1724)  potassium chloride SA (KLOR-CON) CR tablet 40 mEq (40 mEq Oral Given 10/22/20 1847)    ED Course  I have reviewed the triage vital signs and the nursing notes.  Pertinent labs & imaging results that were available during my care of the patient were reviewed by me and considered in my medical decision making (see chart for details).    MDM Rules/Calculators/A&P                          45 y/o F presents for eval of HTN and headache. Also reports an episode of sob this morning and cp last night. Has been compliant with meds.  Reviewed/interpreted labs CBC, BMP are overall reassuring.  She has some mild hypokalemia.  Her delta troponins are negative therefore lower suspicion for ACS.  Her chest pain is completely resolved and her shortness of breath has not recurred since being in the  ED.  UA not suggestive of UTI and beta hCG is negative.  EKG  with NSR, prolonged PR interval.  CXR - No acute intracranial abnormality noted CT head -  No acute intracranial abnormality noted.  Blood pressure elevated upon arrival to the ED.  She was given a dose of clonidine and was also given a migraine cocktail and symptoms improved. W/u here is reassuring and she has no evidence of htn emergency at this time and I doubt acs or other emergent cause of sxs.  Reviewed BP meds and will plan to increase clonidine. Have advised her to monitor bps at home and f/u with pcp next week. Advised on return precautions. She voiced understanding of the plan and reasons to return. All questions answered, pt stable for discharge.   Final Clinical Impression(s) / ED Diagnoses Final diagnoses:  Hypertension, unspecified type  Nonintractable headache, unspecified chronicity pattern, unspecified headache type    Rx / DC Orders ED Discharge Orders         Ordered    cloNIDine (CATAPRES) 0.1 MG tablet  2 times daily        10/22/20 2024           Bishop Dublin 10/22/20 2143    Carmin Muskrat, MD 10/28/20 2351

## 2020-10-22 NOTE — Discharge Instructions (Addendum)
Recommending that we increase your blood pressure medication called clonidine  to 0.4 mg twice daily.   I have written a prescription for 0.1 mg tablets.  You should take 1 of these tablets along with your 0.3 mg tablets twice daily. (this will be a total of 0.4 mg twice daily)  Please keep a log of your blood pressure over the next 1 to 2 weeks and make a follow-up appointment with your regular doctor within the next 5 to 7 days.  Bring the blood pressure log with you.  Please continue to adhere to a low-salt diet and increase your activity levels.  Return to the emergency department for any new or worsening symptoms in the meantime.

## 2020-10-22 NOTE — ED Triage Notes (Addendum)
Pt arrived via walk in, c/o worsening HTN and headache. Hx of HTN, on medications, taking them as prescribed. Recently changed medications a month ago. States issues since. Was seen by PCP recently, BP was good in office so no changes made.

## 2020-11-07 ENCOUNTER — Observation Stay (HOSPITAL_COMMUNITY): Payer: Self-pay

## 2020-11-07 ENCOUNTER — Encounter (HOSPITAL_COMMUNITY): Payer: Self-pay | Admitting: Emergency Medicine

## 2020-11-07 ENCOUNTER — Observation Stay (HOSPITAL_COMMUNITY)
Admission: EM | Admit: 2020-11-07 | Discharge: 2020-11-08 | Disposition: A | Discharge status: Home / Self Care | Payer: Self-pay | Attending: Student | Admitting: Student

## 2020-11-07 ENCOUNTER — Emergency Department (HOSPITAL_COMMUNITY): Payer: Self-pay

## 2020-11-07 ENCOUNTER — Other Ambulatory Visit: Payer: Self-pay

## 2020-11-07 DIAGNOSIS — I160 Hypertensive urgency: Secondary | ICD-10-CM | POA: Insufficient documentation

## 2020-11-07 DIAGNOSIS — Z87891 Personal history of nicotine dependence: Secondary | ICD-10-CM | POA: Insufficient documentation

## 2020-11-07 DIAGNOSIS — Z20822 Contact with and (suspected) exposure to covid-19: Secondary | ICD-10-CM | POA: Insufficient documentation

## 2020-11-07 DIAGNOSIS — Z6841 Body Mass Index (BMI) 40.0 and over, adult: Secondary | ICD-10-CM

## 2020-11-07 DIAGNOSIS — R200 Anesthesia of skin: Principal | ICD-10-CM | POA: Insufficient documentation

## 2020-11-07 DIAGNOSIS — Y900 Blood alcohol level of less than 20 mg/100 ml: Secondary | ICD-10-CM | POA: Insufficient documentation

## 2020-11-07 DIAGNOSIS — R202 Paresthesia of skin: Secondary | ICD-10-CM

## 2020-11-07 DIAGNOSIS — I161 Hypertensive emergency: Secondary | ICD-10-CM

## 2020-11-07 DIAGNOSIS — I10 Essential (primary) hypertension: Secondary | ICD-10-CM | POA: Insufficient documentation

## 2020-11-07 DIAGNOSIS — Z79899 Other long term (current) drug therapy: Secondary | ICD-10-CM | POA: Insufficient documentation

## 2020-11-07 DIAGNOSIS — E876 Hypokalemia: Secondary | ICD-10-CM | POA: Insufficient documentation

## 2020-11-07 DIAGNOSIS — M542 Cervicalgia: Secondary | ICD-10-CM | POA: Insufficient documentation

## 2020-11-07 DIAGNOSIS — M25511 Pain in right shoulder: Secondary | ICD-10-CM | POA: Insufficient documentation

## 2020-11-07 DIAGNOSIS — J45909 Unspecified asthma, uncomplicated: Secondary | ICD-10-CM | POA: Insufficient documentation

## 2020-11-07 DIAGNOSIS — G7000 Myasthenia gravis without (acute) exacerbation: Secondary | ICD-10-CM

## 2020-11-07 LAB — RAPID URINE DRUG SCREEN, HOSP PERFORMED
Amphetamines: NOT DETECTED
Barbiturates: NOT DETECTED
Benzodiazepines: NOT DETECTED
Cocaine: NOT DETECTED
Opiates: NOT DETECTED
Tetrahydrocannabinol: NOT DETECTED

## 2020-11-07 LAB — RESP PANEL BY RT-PCR (FLU A&B, COVID) ARPGX2
Influenza A by PCR: NEGATIVE
Influenza B by PCR: NEGATIVE
SARS Coronavirus 2 by RT PCR: NEGATIVE

## 2020-11-07 LAB — DIFFERENTIAL
Abs Immature Granulocytes: 0.03 10*3/uL (ref 0.00–0.07)
Basophils Absolute: 0 10*3/uL (ref 0.0–0.1)
Basophils Relative: 1 %
Eosinophils Absolute: 0.1 10*3/uL (ref 0.0–0.5)
Eosinophils Relative: 2 %
Immature Granulocytes: 1 %
Lymphocytes Relative: 38 %
Lymphs Abs: 2.5 10*3/uL (ref 0.7–4.0)
Monocytes Absolute: 0.5 10*3/uL (ref 0.1–1.0)
Monocytes Relative: 8 %
Neutro Abs: 3.4 10*3/uL (ref 1.7–7.7)
Neutrophils Relative %: 50 %

## 2020-11-07 LAB — I-STAT CHEM 8, ED
BUN: 8 mg/dL (ref 6–20)
Calcium, Ion: 1.21 mmol/L (ref 1.15–1.40)
Chloride: 101 mmol/L (ref 98–111)
Creatinine, Ser: 0.7 mg/dL (ref 0.44–1.00)
Glucose, Bld: 98 mg/dL (ref 70–99)
HCT: 38 % (ref 36.0–46.0)
Hemoglobin: 12.9 g/dL (ref 12.0–15.0)
Potassium: 3.3 mmol/L — ABNORMAL LOW (ref 3.5–5.1)
Sodium: 139 mmol/L (ref 135–145)
TCO2: 28 mmol/L (ref 22–32)

## 2020-11-07 LAB — COMPREHENSIVE METABOLIC PANEL
ALT: 20 U/L (ref 0–44)
AST: 17 U/L (ref 15–41)
Albumin: 3.8 g/dL (ref 3.5–5.0)
Alkaline Phosphatase: 59 U/L (ref 38–126)
Anion gap: 8 (ref 5–15)
BUN: 10 mg/dL (ref 6–20)
CO2: 27 mmol/L (ref 22–32)
Calcium: 9 mg/dL (ref 8.9–10.3)
Chloride: 104 mmol/L (ref 98–111)
Creatinine, Ser: 0.64 mg/dL (ref 0.44–1.00)
GFR, Estimated: >60 mL/min (ref >60)
Glucose, Bld: 103 mg/dL — ABNORMAL HIGH (ref 70–99)
Potassium: 3.3 mmol/L — ABNORMAL LOW (ref 3.5–5.1)
Sodium: 139 mmol/L (ref 135–145)
Total Bilirubin: 0.7 mg/dL (ref 0.3–1.2)
Total Protein: 7.4 g/dL (ref 6.5–8.1)

## 2020-11-07 LAB — URINALYSIS, ROUTINE W REFLEX MICROSCOPIC
Bilirubin Urine: NEGATIVE
Glucose, UA: NEGATIVE mg/dL
Hgb urine dipstick: NEGATIVE
Ketones, ur: NEGATIVE mg/dL
Nitrite: NEGATIVE
Protein, ur: NEGATIVE mg/dL
Specific Gravity, Urine: 1.01 (ref 1.005–1.030)
pH: 7 (ref 5.0–8.0)

## 2020-11-07 LAB — CBC
HCT: 38.6 % (ref 36.0–46.0)
Hemoglobin: 12.7 g/dL (ref 12.0–15.0)
MCH: 29 pg (ref 26.0–34.0)
MCHC: 32.9 g/dL (ref 30.0–36.0)
MCV: 88.1 fL (ref 80.0–100.0)
Platelets: 361 10*3/uL (ref 150–400)
RBC: 4.38 MIL/uL (ref 3.87–5.11)
RDW: 12.9 % (ref 11.5–15.5)
WBC: 6.6 10*3/uL (ref 4.0–10.5)
nRBC: 0 % (ref 0.0–0.2)

## 2020-11-07 LAB — PROTIME-INR
INR: 1 (ref 0.8–1.2)
Prothrombin Time: 13 seconds (ref 11.4–15.2)

## 2020-11-07 LAB — CBG MONITORING, ED: Glucose-Capillary: 99 mg/dL (ref 70–99)

## 2020-11-07 LAB — ETHANOL: Alcohol, Ethyl (B): <10 mg/dL (ref <10)

## 2020-11-07 LAB — I-STAT BETA HCG BLOOD, ED (MC, WL, AP ONLY): I-stat hCG, quantitative: <5 m[IU]/mL (ref <5)

## 2020-11-07 LAB — TROPONIN I (HIGH SENSITIVITY)
Troponin I (High Sensitivity): 4 ng/L (ref <18)
Troponin I (High Sensitivity): 3 ng/L (ref <18)

## 2020-11-07 LAB — APTT: aPTT: 35 seconds (ref 24–36)

## 2020-11-07 MED ORDER — CLEVIDIPINE BUTYRATE 0.5 MG/ML IV EMUL
0.0000 mg/h | INTRAVENOUS | Status: DC
  Filled 2020-11-07: qty 50

## 2020-11-07 MED ORDER — ACETAMINOPHEN 325 MG PO TABS
650.0000 mg | ORAL_TABLET | ORAL | Status: DC | PRN
  Filled 2020-11-07: qty 2

## 2020-11-07 MED ORDER — ASPIRIN EC 81 MG PO TBEC
81.0000 mg | DELAYED_RELEASE_TABLET | Freq: Every day | ORAL | Status: DC
  Administered 2020-11-08: 81 mg via ORAL
  Filled 2020-11-07: qty 1

## 2020-11-07 MED ORDER — POLYETHYLENE GLYCOL 3350 17 G PO PACK: 17.0000 g | PACK | Freq: Every day | ORAL | Status: DC | PRN

## 2020-11-07 MED ORDER — IOHEXOL 350 MG/ML SOLN
100.0000 mL | Freq: Once | INTRAVENOUS | Status: AC | PRN
  Administered 2020-11-07: 100 mL via INTRAVENOUS

## 2020-11-07 MED ORDER — ASPIRIN 325 MG PO TABS
325.0000 mg | ORAL_TABLET | Freq: Once | ORAL | Status: AC
  Administered 2020-11-07: 325 mg via ORAL

## 2020-11-07 MED ORDER — ONDANSETRON HCL 4 MG/2ML IJ SOLN: 4.0000 mg | Freq: Four times a day (QID) | INTRAMUSCULAR | Status: DC | PRN

## 2020-11-07 MED ORDER — STROKE: EARLY STAGES OF RECOVERY BOOK
Freq: Once | Status: DC
  Filled 2020-11-07: qty 1

## 2020-11-07 MED ORDER — LABETALOL HCL 5 MG/ML IV SOLN
20.0000 mg | INTRAVENOUS | Status: DC | PRN
  Filled 2020-11-07: qty 4

## 2020-11-07 MED ORDER — ACETAMINOPHEN 650 MG RE SUPP: 650.0000 mg | RECTAL | Status: DC | PRN

## 2020-11-07 MED ORDER — POTASSIUM CHLORIDE CRYS ER 20 MEQ PO TBCR
40.0000 meq | EXTENDED_RELEASE_TABLET | Freq: Once | ORAL | Status: AC
  Administered 2020-11-07: 40 meq via ORAL

## 2020-11-07 MED ORDER — HYDRALAZINE HCL 20 MG/ML IJ SOLN: 10.0000 mg | Freq: Once | INTRAMUSCULAR | Status: DC

## 2020-11-07 MED ORDER — MORPHINE SULFATE (PF) 4 MG/ML IV SOLN
4.0000 mg | Freq: Once | INTRAVENOUS | Status: AC
  Administered 2020-11-07: 4 mg via INTRAVENOUS
  Filled 2020-11-07: qty 1

## 2020-11-07 MED ORDER — ACETAMINOPHEN 160 MG/5ML PO SOLN: 650.0000 mg | ORAL | Status: DC | PRN

## 2020-11-07 NOTE — ED Notes (Signed)
Patient transported to CT via stretcher.

## 2020-11-07 NOTE — H&P (Signed)
History and Physical    Terri Chambers KZS:[id number] DOB: January 14, 1976 DOA: 11/07/2020  PCP: Donnie Coffin, MD  Patient coming from:   45 year old female with past medical history of myasthenia gravis (currently in remission, not on Tx), hypertension, vitamin D deficiency, obesity presenting to [facility] emergency department with complaints of shoulder pain as well as right-sided numbness.  Patient explains that yesterday evening she woke up from sleep with a feeling of a "fullness" in her throat that she typically associates with an elevated blood pressure.  She states that she has been struggling with extremely high blood pressure over the past year and attributes her poorly controlled blood pressure to contracting Covid approximately 1 year ago.  Patient states that she had markedly elevated blood pressures throughout the evening.  The following morning at approximately 10:30 AM she states that she began to experience a dull pain just beneath her right scapula.  Patient describes this pain as moderate in intensity nonradiating with no alleviating or exacerbating factors.  Despite the symptoms the patient decided to go to work that morning.   Patient's shoulder discomfort continued to persist over the next several hours.  At approximately 1 PM patient began to experience tingling and numbness of the right face and right arm.  Patient denies any associated visual change, loss of balance, slurring of her words or focal weakness.  It was at this point the patient decided to present to [facility] emergency room for evaluation.  Upon evaluation in the emergency department, patient was found to have an initial blood pressure of 195/124.  Initially, did patient's back pain CT angiogram of the chest abdomen pelvis was performed to evaluate for dissection and was found to be negative.  Then due to patient's symptoms a code stroke was called and patient was promptly evaluated by Dr.  Cheral Marker via telemedicine evaluation.  Initial CT head revealed no evidence of acute disease.  Dr. Cheral Marker had recommended hospitalization at [facility] for complete stroke work-up.  The hospitalist group was then called to assess the patient for admission to the hospital.  Chief Complaint:  Chief Complaint  Patient presents with  . Numbness  . Back Pain     HPI:       Review of Systems:   Review of Systems  Musculoskeletal: Positive for back pain.  Neurological: Positive for tingling.  All other systems reviewed and are negative.   Past Medical History:  Diagnosis Date  . Anemia    WITH PREGNANCY  . Asthma    AS A CHILD  . Depression   . History of asthma   . History of plasmapheresis   . Hypertension   . Migraine headache   . Morbid obesity ([place])   . Myasthenia gravis   . Myasthenia gravis ([place]) 2005    Past Surgical History:  Procedure Laterality Date  . EAR TUBE REMOVAL    . MYOMECTOMY N/A 08/21/2018   Procedure: ABDOMINAL MYOMECTOMY;  Surgeon: Rubie Maid, MD;  Location: [facility] ORS;  Service: Gynecology;  Laterality: N/A;  . TYMPANOSTOMY TUBE PLACEMENT    . UMBILICAL HERNIA REPAIR N/A 08/21/2018   Procedure: HERNIA REPAIR UMBILICAL ADULT;  Surgeon: Fredirick Maudlin, MD;  Location: [facility] ORS;  Service: General;  Laterality: N/A;     reports that she quit smoking about 12 years ago. Her smoking use included cigarettes. She smoked 0.50 packs per day. She has never used smokeless tobacco. She reports that she does not drink alcohol and  does not use drugs.  No Known Allergies  Family History  Problem Relation Age of Onset  . Diabetes Mother   . Arthritis Father   . Hypertension Father   . Cerebral aneurysm Maternal Grandmother   . Congestive Heart Failure Paternal Grandmother   . Cervical cancer Other        Maternal great-grandmother died of cervical cancer  . Other Neg Hx      Prior to Admission medications   Medication Sig Start Date End Date Taking?  Authorizing Provider  APPLE CIDER VINEGAR PO Take 2 tablets by mouth daily.   Yes [provider]  atenolol (TENORMIN) 100 MG tablet Take 100 mg by mouth every morning.    Yes [provider]  Biotin w/ Vitamins C & E (HAIR SKIN & NAILS GUMMIES PO) Take 2 tablets by mouth daily.   Yes [provider]  cloNIDine (CATAPRES - DOSED IN MG/24 HR) 0.3 mg/24hr patch Apply 2 patch to skin once a week 11/04/20  Yes [provider]  cloNIDine (CATAPRES) 0.1 MG tablet Take 1 tablet (0.1 mg total) by mouth 2 (two) times daily. Patient taking differently: Take 0.1 mg by mouth as directed. Take along with 0.3 mg=0.4 mg BID 10/22/20 11/21/20 Yes Couture, Cortni S, PA-C  cloNIDine (CATAPRES) 0.3 MG tablet Take 0.3 mg by mouth as directed. Take along with 0.1 mg=0.4 mg BID 09/08/20  Yes [provider]  losartan (COZAAR) 100 MG tablet Take 1 tablet (100 mg total) by mouth daily. 09/24/20  Yes Bradler, Vista Lawman, MD  OZEMPIC, 0.25 OR 0.5 MG/DOSE, 2 MG/1.5ML SOPN Inject 0.25 mg into the skin once a week. 10/08/20  Yes [provider]    Physical Exam: Vitals:   11/07/20 1815 11/07/20 1900 11/07/20 1936 11/07/20 2000  BP: (!) 185/119 (!) 182/137 (!) 179/126 (!) 148/95  Pulse: 65 75 77 78  Resp: 16 14 16 19   Temp:      TempSrc:      SpO2: 99% 96% 97% 93%  Weight:      Height:        Constitutional: Awake alert and oriented x3, no associated distress.   Skin: no rashes, no lesions, good skin turgor noted. Eyes: Pupils are equally reactive to light.  No evidence of scleral icterus or conjunctival pallor.  ENMT: Moist mucous membranes noted.  Posterior pharynx clear of any exudate or lesions.   Neck: normal, supple, no masses, no thyromegaly.  No evidence of jugular venous distension.   Respiratory: clear to auscultation bilaterally, no wheezing, no crackles. Normal respiratory effort. No accessory muscle use.  Cardiovascular: Regular rate and rhythm, no murmurs /  rubs / gallops. No extremity edema. 2+ pedal pulses. No carotid bruits.  Chest:   Nontender without crepitus or deformity.   Back:   Nontender without crepitus or deformity. Abdomen: Abdomen is soft and nontender.  No evidence of intra-abdominal masses.  Positive bowel sounds noted in all quadrants.   Musculoskeletal: No joint deformity upper and lower extremities. Good ROM, no contractures. Normal muscle tone.  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  Otherwise cranial nerves are intact.   Sensation intact.  Patient moving all 4 extremities spontaneously.  Patient is following all commands.  Patient is responsive to verbal stimuli.   Psychiatric: Patient exhibits normal mood with appropriate affect.  Patient seems to possess insight as to their current situation.     Labs  on Admission: I have personally reviewed following labs and imaging studies -   CBC: Recent Labs  Lab 11/07/20 1402 11/07/20 1411  WBC 6.6  --   NEUTROABS 3.4  --   HGB 12.7 12.9  HCT 38.6 38.0  MCV 88.1  --   PLT 361  --    Basic Metabolic Panel: Recent Labs  Lab 11/07/20 1402 11/07/20 1411  NA 139 139  K 3.3* 3.3*  CL 104 101  CO2 27  --   GLUCOSE 103* 98  BUN 10 8  CREATININE 0.64 0.70  CALCIUM 9.0  --    GFR: Estimated Creatinine Clearance: 127.5 mL/min (by C-G formula based on SCr of 0.7 mg/dL). Liver Function Tests: Recent Labs  Lab 11/07/20 1402  AST 17  ALT 20  ALKPHOS 59  BILITOT 0.7  PROT 7.4  ALBUMIN 3.8   No results for input(s): LIPASE, AMYLASE in the last 168 hours. No results for input(s): AMMONIA in the last 168 hours. Coagulation Profile: Recent Labs  Lab 11/07/20 1402  INR 1.0   Cardiac Enzymes: No results for input(s): CKTOTAL, CKMB, CKMBINDEX, TROPONINI in the last 168 hours. BNP (last 3 results) No results for input(s): PROBNP in the last 8760 hours. HbA1C: No results for input(s):  HGBA1C in the last 72 hours. CBG: Recent Labs  Lab 11/07/20 1356  GLUCAP 99   Lipid Profile: No results for input(s): CHOL, HDL, LDLCALC, TRIG, CHOLHDL, LDLDIRECT in the last 72 hours. Thyroid Function Tests: No results for input(s): TSH, T4TOTAL, FREET4, T3FREE, THYROIDAB in the last 72 hours. Anemia Panel: No results for input(s): VITAMINB12, FOLATE, FERRITIN, TIBC, IRON, RETICCTPCT in the last 72 hours. Urine analysis:    Component Value Date/Time   COLORURINE YELLOW 11/07/2020 1442   APPEARANCEUR CLEAR 11/07/2020 1442   APPEARANCEUR Hazy 05/10/2014 1237   LABSPEC 1.010 11/07/2020 1442   LABSPEC 1.018 05/10/2014 1237   PHURINE 7.0 11/07/2020 1442   GLUCOSEU NEGATIVE 11/07/2020 1442   GLUCOSEU Negative 05/10/2014 1237   HGBUR NEGATIVE 11/07/2020 1442   BILIRUBINUR NEGATIVE 11/07/2020 1442   BILIRUBINUR Negative 05/10/2014 1237   KETONESUR NEGATIVE 11/07/2020 1442   PROTEINUR NEGATIVE 11/07/2020 1442   UROBILINOGEN 0.2 02/18/2012 1643   NITRITE NEGATIVE 11/07/2020 1442   LEUKOCYTESUR SMALL (A) 11/07/2020 1442   LEUKOCYTESUR Trace 05/10/2014 1237    Radiological Exams on Admission - Personally Reviewed: CT Angio Chest/Abd/Pel for Dissection W and/or W/WO  Result Date: 11/07/2020 CLINICAL DATA:  Chest and back pain.  Aortic dissection protocol. EXAM: CT ANGIOGRAPHY CHEST, ABDOMEN AND PELVIS TECHNIQUE: Non-contrast CT of the chest was initially obtained. Multidetector CT imaging through the chest, abdomen and pelvis was performed using the standard protocol during bolus administration of intravenous contrast. Multiplanar reconstructed images and MIPs were obtained and reviewed to evaluate the vascular anatomy. CONTRAST:  131mL OMNIPAQUE IOHEXOL 350 MG/ML SOLN COMPARISON:  CT scan 06/13/2018 FINDINGS: CTA CHEST FINDINGS Cardiovascular: The heart is borderline enlarged for age. No pericardial effusion. The aorta is normal in caliber. No dissection. No atherosclerotic calcification.  The branch vessels are patent. No coronary artery calcifications are identified. The pulmonary arteries are unremarkable. Mediastinum/Nodes: No mediastinal or hilar mass or lymphadenopathy. The esophagus is grossly normal. Lungs/Pleura: A few patchy areas of subpleural atelectasis but no pulmonary infiltrates or pleural effusions. No worrisome pulmonary lesions. Musculoskeletal: No breast masses are identified. There is a borderline enlarged left axillary lymph node which measures up to 16 mm. I do not see this on the prior  studies. Recommend correlation with physical examination and mammography if indicated. The bony thorax is intact. Review of the MIP images confirms the above findings. CTA ABDOMEN AND PELVIS FINDINGS VASCULAR Aorta: Normal caliber. No dissection. No atherosclerotic calcifications. Celiac: Normal SMA: Normal Renals: Normal IMA: Normal Inflow: Normal Veins: Normal Review of the MIP images confirms the above findings. NON-VASCULAR Hepatobiliary: No hepatic lesions are identified. No intrahepatic biliary dilatation. The gallbladder is grossly normal. No common bile duct dilatation. Pancreas: No mass, inflammation or ductal dilatation. Spleen: Normal size.  No focal lesions. Adrenals/Urinary Tract: The adrenal glands and kidneys are unremarkable. The bladder is unremarkable. Stomach/Bowel: The stomach, duodenum, small bowel and colon are grossly normal without oral contrast. No inflammatory changes, mass lesions or obstructive findings. The appendix is normal. Lymphatic: No mesenteric or retroperitoneal mass or adenopathy. Reproductive: The uterus and left ovary are unremarkable. 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 Prior large uterine fibroids have been resected. Other: Moderate-sized periumbilical abdominal wall hernia containing fat. Musculoskeletal: No significant bony findings. Review of the MIP images confirms the above findings. IMPRESSION: 1. Normal thoracic  and abdominal aorta. No aneurysm or dissection. 2. No acute pulmonary findings. Minimal patchy areas of subpleural atelectasis. 3. Borderline enlarged left axillary lymph node. Recommend correlation with physical examination and mammography if indicated. 4. No acute abdominal/pelvic findings, mass lesions or lymphadenopathy. 5. Moderate-sized periumbilical abdominal wall hernia containing fat. Electronically Signed   By: Marijo Sanes M.D.   On: 11/07/2020 18:09   CT HEAD CODE STROKE WO CONTRAST  Result Date: 11/07/2020 CLINICAL DATA:  Code stroke. Initial evaluation for neuro deficit, stroke suspected. EXAM: CT HEAD WITHOUT CONTRAST TECHNIQUE: Contiguous axial images were obtained from the base of the skull through the vertex without intravenous contrast. COMPARISON:  Prior CT from 10/22/2020. FINDINGS: Brain: Cerebral volume within normal limits. No acute intracranial hemorrhage. No acute large vessel territory infarct. No mass lesion, midline shift or mass effect. No hydrocephalus. Slight asymmetry of the lateral ventricles noted, stable from previous. No extra-axial fluid collection. Vascular: No hyperdense vessel. Skull: Scalp soft tissues and calvarium within normal limits. Sinuses/Orbits: Globes and orbital soft tissues within normal limits. Paranasal sinuses and mastoid air cells are clear. Other: None. ASPECTS [facility] Stroke Program Early CT Score) - Ganglionic level infarction (caudate, lentiform nuclei, internal capsule, insula, M1-M3 cortex): 7 - Supraganglionic infarction (M4-M6 cortex): 3 Total score (0-10 with 10 being normal): 10 IMPRESSION: 1. Negative head CT.  No acute intracranial abnormality. 2. ASPECTS is 10. Critical Value/emergent results were called by telephone at the time of interpretation on 11/07/2020 at 2:19 pm to provider [facility] , who verbally acknowledged these results. Electronically Signed   By: Jeannine Boga M.D.   On: 11/07/2020 14:21    EKG: Personally  reviewed.  Rhythm is normal sinus rhythm with heart rate of 64 bpm.  No dynamic ST segment changes appreciated.  Assessment/Plan Principal Problem:   Right sided numbness   Patient developed an approximate 5-hour episode of right facial and right arm numbness and tingling.  The symptoms followed markedly elevated blood pressures from the evening before with patient presenting to the emergency department with continued markedly elevated blood pressures.  Symptoms are possibly secondary to a TIA.  They seem to be resolving quickly throughout the emergency department stay.    Patient is already been promptly evaluated by Dr. Cheral Marker and his input is appreciated.  Patient will be hospitalized for full work-up.  Performing serial neuro checks   Monitoring patient on telemetry   PT/OT/SLP evaluation   Echocardiogram in the morning   MRI brain ordered for the morning   CT angiogram of the head and neck ordered   Patient will be provided with aspirin therapy   Permissive hypertension overnight until results of MRI can be obtained although I suspect that MRI will be negative and home antihypertensive therapy can be promptly resumed.  Active Problems:   Hypertensive urgency  Permissive hypertension overnight  Since patient already has her transdermal clonidine patch on will allow these patches to remain to avoid rebound hypertension  We will additionally provide patient with as needed intravenous) only if systolic blood pressures rise above 892 or diastolic above 119.  EKG reveals no evidence of dynamic changes  Monitoring patient on telemetry    Myasthenia gravis without exacerbation ([place])   Diagnosed in 2005  Patient has been on multiple therapies in the past including CellCept, prednisone and Mestinon  Patient is currently felt to be in remission and is not on daily therapy.  I do not believe patient's myasthenia gravis is related to this current episode  Outpatient  neurology follow-up    Class 3 severe obesity due to excess calories with serious comorbidity and body mass index (BMI) of 40.0 to 44.9 in adult [facility])   Counseling patient on caloric restriction and regular physical activity    Hypokalemia   Replacing with oral potassium chloride  Monitoring potassium levels with serial chemistries.   Code Status:  Full code Family Communication: deferred   Status is: Observation  The patient remains OBS appropriate and will d/c before 2 midnights.  Dispo: The patient is from: Home              Anticipated d/c is to: Home              Patient currently is not medically stable to d/c.   Difficult to place patient No        Vernelle Emerald MD [facility] Pager [phone]  If 7PM-7AM, please contact night-coverage [email or web address] Use universal [place] password for that web site. If you do not have the password, please call the hospital operator.  11/07/2020, 8:48 PM

## 2020-11-07 NOTE — Consult Note (Addendum)
[facility] TeleNeurology Consult Services    Date of Service:  11/07/2020      Metrics: Last Known Well: 1030 Symptoms: As per HPI.  Patient is not a candidate for thrombolytic. NIHSS of 0.    ED Physician notified of diagnostic impression and management plan at the time of completion of Neurological exam:   Assessment: 45 year old female with a history of anemia, migraine headaches, morbid obesity, myasthenia gravis, HTN, depression and asthma who presents to the [facility] ED with acute onset of right facial numbness, right shoulder blade/back pain and numbness and tingling in fingers on her right hand. The pain started about 2 hours prior to presentation. The face and hand numbness began 45 minutes PTA.   - Exam is nonfocal with a NIHSS of 0.  - DDx for presentation includes hypertensive urgency with neurological symptoms, acute lacunar infarction and stress-related symptoms.  - Remote history of migraine headache. The patient states that she currently has only minimal frontal discomfort, which does not feel like a headache.  - History of myasthenia gravis. Not currently on medications for this. Per patient, she was told by one of her physicians that her MG was "no longer active". She denies any symptoms of weakness.    Recommendations: - MRI brain - CTA of head and neck - Chest pain work up - Frequent neuro checks  - BP management - The patient endorses first starting a new clonidine patch formulation for her HTN a few hours prior to onset of symptoms. Would call Pharmacy to determine if shoulder pain or pain near the site of clonidine patch application is a known side effect.  - Outpatient Neurology follow up after discharge    ------------------------------------------------------------------------------   History of Present Illness: 45 year old female with a history of anemia, migraine headaches, morbid obesity, myasthenia gravis, HTN, depression and asthma who presents to the  [facility] ED with acute onset of right facial numbness, right shoulder blade/back pain and numbness and tingling in fingers on her right hand. The pain started about 2 hours ago. The face and hand numbness began 45 minutes ago. Code Stroke was called and she was sent for a STAT CT head.     She was seen in the ED on 3/23 for evaluation of HTN with right sided migraine headache symptoms and one month prior to that for HTN and nosebleed. CT head at the 3/23 visit was normal. She was treated with clonidine and a migraine cocktail on 3/23 visit with improvement of symptoms.   EKG today: Sinus rhythm. Consider left atrial enlargement. Left ventricular hypertrophy  Labs show low potassium, normal sodium, normal BUN/Cr and normal WBC.   Past Medical History: Past Medical History:  Diagnosis Date  . Anemia    WITH PREGNANCY  . Asthma    AS A CHILD  . Depression   . History of asthma   . History of plasmapheresis   . Hypertension   . Migraine headache   . Morbid obesity ([place])   . Myasthenia gravis   . Myasthenia gravis ([place]) 2005      Past Surgical History: Past Surgical History:  Procedure Laterality Date  . EAR TUBE REMOVAL    . MYOMECTOMY N/A 08/21/2018   Procedure: ABDOMINAL MYOMECTOMY;  Surgeon: Rubie Maid, MD;  Location: [facility] ORS;  Service: Gynecology;  Laterality: N/A;  . TYMPANOSTOMY TUBE PLACEMENT    . UMBILICAL HERNIA REPAIR N/A 08/21/2018   Procedure: HERNIA REPAIR UMBILICAL ADULT;  Surgeon: Fredirick Maudlin,  MD;  Location: [facility] ORS;  Service: General;  Laterality: N/A;     Medications:  Current Meds  Medication Sig  . APPLE CIDER VINEGAR PO Take 2 tablets by mouth daily.  Marland Kitchen atenolol (TENORMIN) 100 MG tablet Take 100 mg by mouth every morning.   . Biotin w/ Vitamins C & E (HAIR SKIN & NAILS GUMMIES PO) Take 2 tablets by mouth daily.  . cloNIDine (CATAPRES - DOSED IN MG/24 HR) 0.3 mg/24hr patch Apply 2 patch to skin once a week  . cloNIDine (CATAPRES) 0.1 MG tablet Take 1  tablet (0.1 mg total) by mouth 2 (two) times daily. (Patient taking differently: Take 0.1 mg by mouth as directed. Take along with 0.3 mg=0.4 mg BID)  . cloNIDine (CATAPRES) 0.3 MG tablet Take 0.3 mg by mouth as directed. Take along with 0.1 mg=0.4 mg BID  . losartan (COZAAR) 100 MG tablet Take 1 tablet (100 mg total) by mouth daily.  Marland Kitchen OZEMPIC, 0.25 OR 0.5 MG/DOSE, 2 MG/1.5ML SOPN Inject 0.25 mg into the skin once a week.       Social History: Drug Use: No   Family History:  Reviewed in Epic   ROS: As per HPI    Anticoagulant use: None   Antiplatelet use: None   Examination:    BP 195/124, HR 60, RR 19, O2 Sats normal, afebrile  1A: Level of Consciousness - 0 1B: Ask Month and Age - 0 1C: Blink Eyes & Squeeze Hands - 0 2: Test Horizontal Extraocular Movements - 0 3: Test Visual Fields - 0 4: Test Facial Palsy (Use Grimace if Obtunded) - 0 5A: Test Left Arm Motor Drift - 0 5B: Test Right Arm Motor Drift - 0 6A: Test Left Leg Motor Drift - 0 6B: Test Right Leg Motor Drift - 0 7: Test Limb Ataxia (FNF/Heel-Shin) - 0 8: Test Sensation -  0 9: Test Language/Aphasia - 0 10: Test Dysarthria - Severe Dysarthria: 0 11: Test Extinction/Inattention - Extinction to bilateral simultaneous stimulation 0   NIHSS Score: 0   Pre-Morbid Modified Rankin Scale: 0     Patient/Family was informed the Neurology Consult would occur via TeleHealth consult by way of interactive audio and video telecommunications and consented to receiving care in this manner.   Patient is being evaluated for possible acute neurologic impairment and high pretest probability of imminent or life-threatening deterioration. I spent total of 35 minutes providing care to this patient, including time for face to face visit via telemedicine, review of medical records, imaging studies and discussion of findings with providers, the patient and/or family.   Electronically signed: Dr. Kerney Elbe

## 2020-11-07 NOTE — ED Provider Notes (Signed)
[place] DEPT Provider Note   CSN: [id number] Arrival date & time: 11/07/20  1336    History Chief Complaint  Patient presents with  . Numbness  . Back Pain    Terri Chambers is a 45 y.o. female with past medical history significant for morbid obesity, myasthenia gravis, hypertension, hyperlipidemia who presents for evaluation of elevated blood pressure.  Patient states around 3 hours ago she felt like she had pain to her right shoulder blade.  "Goes right through me to the front."  Felt like she had some numbness and tingling in her fingers on her right hand which start from her wrist distally.  She denies any weakness.  Recently started on clonidine patches for hypertension.  Has some generalized neck pain.  No sudden onset thunderclap headache.  No recent injury or trauma.  No facial droop, weakness, difficulty with word finding.  No shortness of breath, cough, diarrhea, dysuria.  No weakness or paresthesias to her lower extremities. Has not taken anything for symptoms.  History obtained from patient and past medical records. No interpretor was used.   Attending Dr. Regenia Skeeter at bedside on patient arrival. Plan to consult with Neuro for possible code stroke.  HPI     Past Medical History:  Diagnosis Date  . Anemia    WITH PREGNANCY  . Asthma    AS A CHILD  . Depression   . History of asthma   . History of plasmapheresis   . Hypertension   . Migraine headache   . Morbid obesity ([place])   . Myasthenia gravis   . Myasthenia gravis [facility]) 2005    Patient Active Problem List   Diagnosis Date Noted  . S/P myomectomy 08/21/2018  . Essential hypertension 11/05/2017  . Morbid obesity ([place]) 11/05/2017  . Umbilical hernia without obstruction and without gangrene 11/05/2017  . Menorrhagia with regular cycle 11/05/2017  . Right ovarian cyst 11/05/2017  . Uterine leiomyoma 11/05/2017  . Abnormal electrocardiogram 12/23/2016  . Blurred vision  05/05/2015  . Dysphagia 05/05/2015  . Encounter for therapeutic drug monitoring 10/02/2012  . Myasthenia gravis without exacerbation ([place]) 10/02/2012  . Myasthenia gravis with exacerbation ([place]) 10/02/2012    Past Surgical History:  Procedure Laterality Date  . EAR TUBE REMOVAL    . MYOMECTOMY N/A 08/21/2018   Procedure: ABDOMINAL MYOMECTOMY;  Surgeon: Rubie Maid, MD;  Location: [facility] ORS;  Service: Gynecology;  Laterality: N/A;  . TYMPANOSTOMY TUBE PLACEMENT    . UMBILICAL HERNIA REPAIR N/A 08/21/2018   Procedure: HERNIA REPAIR UMBILICAL ADULT;  Surgeon: Fredirick Maudlin, MD;  Location: [facility] ORS;  Service: General;  Laterality: N/A;     OB History    Gravida  1   Para  1   Term  1   Preterm      AB      Living  1     SAB      IAB      Ectopic      Multiple      Live Births              Family History  Problem Relation Age of Onset  . Diabetes Mother   . Arthritis Father   . Hypertension Father   . Cerebral aneurysm Maternal Grandmother   . Congestive Heart Failure Paternal Grandmother   . Cervical cancer Other        Maternal great-grandmother died of cervical cancer  . Other Neg Hx  Social History   Tobacco Use  . Smoking status: Former Smoker    Packs/day: 0.50    Types: Cigarettes    Quit date: 08/02/2008    Years since quitting: 12.2  . Smokeless tobacco: Never Used  Vaping Use  . Vaping Use: Never used  Substance Use Topics  . Alcohol use: No  . Drug use: No    Home Medications Prior to Admission medications   Medication Sig Start Date End Date Taking? Authorizing Provider  APPLE CIDER VINEGAR PO Take 2 tablets by mouth daily.   Yes [provider]  atenolol (TENORMIN) 100 MG tablet Take 100 mg by mouth every morning.    Yes [provider]  Biotin w/ Vitamins C & E (HAIR SKIN & NAILS GUMMIES PO) Take 2 tablets by mouth daily.   Yes [provider]  cloNIDine (CATAPRES - DOSED IN MG/24 HR) 0.3 mg/24hr  patch Apply 2 patch to skin once a week 11/04/20  Yes [provider]  cloNIDine (CATAPRES) 0.1 MG tablet Take 1 tablet (0.1 mg total) by mouth 2 (two) times daily. Patient taking differently: Take 0.1 mg by mouth as directed. Take along with 0.3 mg=0.4 mg BID 10/22/20 11/21/20 Yes Couture, Cortni S, PA-C  cloNIDine (CATAPRES) 0.3 MG tablet Take 0.3 mg by mouth as directed. Take along with 0.1 mg=0.4 mg BID 09/08/20  Yes [provider]  losartan (COZAAR) 100 MG tablet Take 1 tablet (100 mg total) by mouth daily. 09/24/20  Yes Bradler, Vista Lawman, MD  OZEMPIC, 0.25 OR 0.5 MG/DOSE, 2 MG/1.5ML SOPN Inject 0.25 mg into the skin once a week. 10/08/20  Yes [provider]    Allergies    Patient has no known allergies.  Review of Systems   Review of Systems  Constitutional: Negative.   HENT: Negative.   Eyes: Negative.   Respiratory: Negative.   Cardiovascular: Negative.   Gastrointestinal: Negative.   Genitourinary: Negative.   Musculoskeletal: Positive for back pain (Right scapular pain) and neck pain (Generalized). Negative for arthralgias, gait problem, joint swelling, myalgias and neck stiffness.  Skin: Negative.   Neurological: Positive for weakness (Right hand) and numbness (Right face, hand.). Negative for dizziness, tremors, seizures, syncope, facial asymmetry, speech difficulty, light-headedness and headaches.  All other systems reviewed and are negative.  Physical Exam Updated Vital Signs BP (!) 188/124   Pulse 62   Temp 98.2 F (36.8 C) (Oral)   Resp 17   Ht 5\' 8"  (1.727 m)   Wt 127 kg   LMP 10/17/2020 (Approximate)   SpO2 100%   BMI 42.57 kg/m   Physical Exam Vitals and nursing note reviewed.  Constitutional:      General: She is not in acute distress.    Appearance: She is well-developed. She is obese. She is not ill-appearing or toxic-appearing.  HENT:     Head: Atraumatic.  Eyes:     Pupils: Pupils are equal, round, and reactive to light.   Neck:     Trachea: Trachea and phonation normal.     Comments: Full active and passive ROM without pain No midline or paraspinal tenderness No nuchal rigidity or meningeal signs  Cardiovascular:     Rate and Rhythm: Normal rate.     Pulses: Normal pulses.          Radial pulses are 2+ on the right side and 2+ on the left side.       Dorsalis pedis pulses are 2+ on the right side  and 2+ on the left side.     Heart sounds: Normal heart sounds.  Pulmonary:     Effort: Pulmonary effort is normal. No respiratory distress.     Breath sounds: Normal breath sounds and air entry.       Comments: Speaks in full sentences without difficulty lungs clear to auscultation bilaterally Chest:     Comments: Equal rise and fall to chest wall Abdominal:     General: Bowel sounds are normal. There is no distension.     Palpations: Abdomen is soft.     Tenderness: There is no abdominal tenderness. There is no right CVA tenderness, left CVA tenderness, guarding or rebound.     Hernia: No hernia is present.     Comments: Soft, nontender  Musculoskeletal:        General: Normal range of motion.     Cervical back: Full passive range of motion without pain and normal range of motion.       Back:     Comments: Moves all 4 extremities without difficulty.  Able to raise bilateral upper and lower extremities against resistance.  No midline back pain, step.  Reproducible pain over right scapula.  No overlying skin changes  Skin:    General: Skin is warm and dry.     Capillary Refill: Capillary refill takes less than 2 seconds.     Comments: No edema, erythema or warmth.  Neurological:     Mental Status: She is alert.     Comments: Mental Status:  Alert, oriented, thought content appropriate. Speech fluent without evidence of aphasia. Able to follow 2 step commands without difficulty.  Cranial Nerves:  II:  Peripheral visual fields grossly normal, pupils equal, round, reactive to light III,IV, VI: ptosis  not present, extra-ocular motions intact bilaterally  V,VII: smile symmetric, facial light touch sensation equal VIII: hearing grossly normal bilaterally  IX,X: midline uvula rise  XI: bilateral shoulder shrug equal and strong XII: midline tongue extension  Motor:  5/5 in upper and lower extremities bilaterally including strong and equal grip strength and dorsiflexion/plantar flexion Sensory: Pinprick and light touch normal in all extremities.  Cerebellar: normal finger-to-nose with bilateral upper extremities CV: distal pulses palpable throughout       ED Results / Procedures / Treatments   Labs (all labs ordered are listed, but only abnormal results are displayed) Labs Reviewed  COMPREHENSIVE METABOLIC PANEL - Abnormal; Notable for the following components:      Result Value   Potassium 3.3 (*)    Glucose, Bld 103 (*)    All other components within normal limits  URINALYSIS, ROUTINE W REFLEX MICROSCOPIC - Abnormal; Notable for the following components:   Leukocytes,Ua SMALL (*)    Bacteria, UA RARE (*)    All other components within normal limits  I-STAT CHEM 8, ED - Abnormal; Notable for the following components:   Potassium 3.3 (*)    All other components within normal limits  RESP PANEL BY RT-PCR (FLU A&B, COVID) ARPGX2  ETHANOL  PROTIME-INR  APTT  CBC  DIFFERENTIAL  RAPID URINE DRUG SCREEN, HOSP PERFORMED  CBG MONITORING, ED  I-STAT BETA HCG BLOOD, ED ([facility], [facility], [facility] ONLY)  TROPONIN I (HIGH SENSITIVITY)  TROPONIN I (HIGH SENSITIVITY)    EKG EKG Interpretation  Date/Time:  Friday November 07 2020 13:53:15 EDT Ventricular Rate:  64 PR Interval:  198 QRS Duration: 88 QT Interval:  445 QTC Calculation: 460 R Axis:   -2 Text Interpretation: Sinus rhythm  Consider left atrial enlargement Left ventricular hypertrophy Confirmed by Sherwood Gambler [phone]) on 11/07/2020 3:08:04 PM   Radiology CT HEAD CODE STROKE WO CONTRAST  Result Date: 11/07/2020 CLINICAL DATA:  Code  stroke. Initial evaluation for neuro deficit, stroke suspected. EXAM: CT HEAD WITHOUT CONTRAST TECHNIQUE: Contiguous axial images were obtained from the base of the skull through the vertex without intravenous contrast. COMPARISON:  Prior CT from 10/22/2020. FINDINGS: Brain: Cerebral volume within normal limits. No acute intracranial hemorrhage. No acute large vessel territory infarct. No mass lesion, midline shift or mass effect. No hydrocephalus. Slight asymmetry of the lateral ventricles noted, stable from previous. No extra-axial fluid collection. Vascular: No hyperdense vessel. Skull: Scalp soft tissues and calvarium within normal limits. Sinuses/Orbits: Globes and orbital soft tissues within normal limits. Paranasal sinuses and mastoid air cells are clear. Other: None. ASPECTS [facility] Stroke Program Early CT Score) - Ganglionic level infarction (caudate, lentiform nuclei, internal capsule, insula, M1-M3 cortex): 7 - Supraganglionic infarction (M4-M6 cortex): 3 Total score (0-10 with 10 being normal): 10 IMPRESSION: 1. Negative head CT.  No acute intracranial abnormality. 2. ASPECTS is 10. Critical Value/emergent results were called by telephone at the time of interpretation on 11/07/2020 at 2:19 pm to provider [facility] , who verbally acknowledged these results. Electronically Signed   By: Jeannine Boga M.D.   On: 11/07/2020 14:21    Procedures .Critical Care Performed by: Nettie Elm, PA-C Authorized by: Nettie Elm, PA-C   Critical care provider statement:    Critical care time (minutes):  45   Critical care was necessary to treat or prevent imminent or life-threatening deterioration of the following conditions:  CNS failure or compromise and circulatory failure   Critical care was time spent personally by me on the following activities:  Discussions with consultants, evaluation of patient's response to treatment, examination of patient, ordering and performing treatments  and interventions, ordering and review of laboratory studies, ordering and review of radiographic studies, pulse oximetry, re-evaluation of patient's condition, obtaining history from patient or surrogate and review of old charts     Medications Ordered in ED Medications  iohexol (OMNIPAQUE) 350 MG/ML injection 100 mL (100 mLs Intravenous Contrast Given 11/07/20 1423)  morphine 4 MG/ML injection 4 mg (4 mg Intravenous Given 11/07/20 1638)    ED Course  I have reviewed the triage vital signs and the nursing notes.  Pertinent labs & imaging results that were available during my care of the patient were reviewed by me and considered in my medical decision making (see chart for details).  Presents for evaluation of hypertension with possible code stroke.  Attending at bedside>> recommends consult with Neuro for possible code stroke  CONSULT with Dr. Malen Gauze with neuro, recommends called code stroke with Tele Neuro doc  Clinically patient with right scapula pain and significant HTN, Will add on CTA to look at aorta as well  She does endorse decrease sensation to her hand however has equal handgrip bilaterally.  Otherwise nonfocal neuro exam.  Plan on labs, imaging and reassess  Labs and imaging personally reviewed and interpreted:  CBC without leukocytosis INR 1.0 CMP potassium 3.3, glucose 103 Pregnancy test negative UDS negative UA negative for infection CT head negative for acute infarct  Attending, Dr. Regenia Skeeter discussed with neurology, Dr. Cheral Marker.  Recommends inpatient admission for MRI, BP management  Will give IV Hydralazine for significantly elevated BP. Has clonidine patches on currently.  Care transferred to [facility] [place] who will follow up on imaging and  dispo.  Patient seen eval by attending, Dr. Regenia Skeeter who agrees with treatment, plan and disposition.    MDM Rules/Calculators/A&P                           Final Clinical Impression(s) / ED Diagnoses Final  diagnoses:  Hypertensive emergency  Paresthesias    Rx / DC Orders ED Discharge Orders    None       Jadeyn Hargett A, PA-C 11/07/20 1805    Sherwood Gambler, MD 11/13/20 [phone]

## 2020-11-07 NOTE — ED Notes (Signed)
Patient took 0.4 mg of her home clonidine PO, PA notified. Will wait to start cleviprex drip.

## 2020-11-07 NOTE — ED Triage Notes (Addendum)
Patient complains of right sided facial numbness, R shoulder blade/back pain, numbness and tingling in fingers on her right had that started about 2 hours ago for pain, 45 min for numbness in face and hands. Recently started bilateral clonidine patches on her shoulders, placed this AM.

## 2020-11-08 ENCOUNTER — Observation Stay (HOSPITAL_BASED_OUTPATIENT_CLINIC_OR_DEPARTMENT_OTHER): Payer: Self-pay

## 2020-11-08 ENCOUNTER — Observation Stay (HOSPITAL_COMMUNITY): Payer: Self-pay

## 2020-11-08 DIAGNOSIS — I6389 Other cerebral infarction: Secondary | ICD-10-CM

## 2020-11-08 DIAGNOSIS — R200 Anesthesia of skin: Secondary | ICD-10-CM

## 2020-11-08 LAB — CBC
HCT: 37 % (ref 36.0–46.0)
Hemoglobin: 12.1 g/dL (ref 12.0–15.0)
MCH: 28.9 pg (ref 26.0–34.0)
MCHC: 32.7 g/dL (ref 30.0–36.0)
MCV: 88.3 fL (ref 80.0–100.0)
Platelets: 316 10*3/uL (ref 150–400)
RBC: 4.19 MIL/uL (ref 3.87–5.11)
RDW: 13.1 % (ref 11.5–15.5)
WBC: 5.7 10*3/uL (ref 4.0–10.5)
nRBC: 0 % (ref 0.0–0.2)

## 2020-11-08 LAB — APTT: aPTT: 34 seconds (ref 24–36)

## 2020-11-08 LAB — COMPREHENSIVE METABOLIC PANEL
ALT: 18 U/L (ref 0–44)
AST: 16 U/L (ref 15–41)
Albumin: 3.5 g/dL (ref 3.5–5.0)
Alkaline Phosphatase: 53 U/L (ref 38–126)
Anion gap: 10 (ref 5–15)
BUN: 10 mg/dL (ref 6–20)
CO2: 26 mmol/L (ref 22–32)
Calcium: 9.1 mg/dL (ref 8.9–10.3)
Chloride: 103 mmol/L (ref 98–111)
Creatinine, Ser: 0.63 mg/dL (ref 0.44–1.00)
GFR, Estimated: >60 mL/min (ref >60)
Glucose, Bld: 107 mg/dL — ABNORMAL HIGH (ref 70–99)
Potassium: 3.2 mmol/L — ABNORMAL LOW (ref 3.5–5.1)
Sodium: 139 mmol/L (ref 135–145)
Total Bilirubin: 0.4 mg/dL (ref 0.3–1.2)
Total Protein: 6.6 g/dL (ref 6.5–8.1)

## 2020-11-08 LAB — LIPID PANEL
Cholesterol: 149 mg/dL (ref 0–200)
HDL: 36 mg/dL — ABNORMAL LOW (ref >40)
LDL Cholesterol: 95 mg/dL (ref 0–99)
Total CHOL/HDL Ratio: 4.1 RATIO
Triglycerides: 88 mg/dL (ref <150)
VLDL: 18 mg/dL (ref 0–40)

## 2020-11-08 LAB — ECHOCARDIOGRAM COMPLETE BUBBLE STUDY
Area-P 1/2: 2.5 cm2
Calc EF: 74 %
S' Lateral: 2.7 cm
Single Plane A2C EF: 80.8 %
Single Plane A4C EF: 65.5 %

## 2020-11-08 LAB — HEMOGLOBIN A1C
Hgb A1c MFr Bld: 6.2 % — ABNORMAL HIGH (ref 4.8–5.6)
Mean Plasma Glucose: 131.24 mg/dL

## 2020-11-08 LAB — PROTIME-INR
INR: 1.1 (ref 0.8–1.2)
Prothrombin Time: 13.3 seconds (ref 11.4–15.2)

## 2020-11-08 LAB — MAGNESIUM: Magnesium: 1.8 mg/dL (ref 1.7–2.4)

## 2020-11-08 LAB — TSH: TSH: 1.581 u[IU]/mL (ref 0.350–4.500)

## 2020-11-08 LAB — HIV ANTIBODY (ROUTINE TESTING W REFLEX): HIV Screen 4th Generation wRfx: NONREACTIVE

## 2020-11-08 MED ORDER — POTASSIUM CHLORIDE CRYS ER 20 MEQ PO TBCR
40.0000 meq | EXTENDED_RELEASE_TABLET | ORAL | Status: AC
  Administered 2020-11-08 (×2): 40 meq via ORAL
  Filled 2020-11-08: qty 2

## 2020-11-08 MED ORDER — LORAZEPAM 2 MG/ML IJ SOLN
1.0000 mg | INTRAMUSCULAR | Status: DC | PRN
  Administered 2020-11-08: 1 mg via INTRAVENOUS
  Filled 2020-11-08: qty 1

## 2020-11-08 MED ORDER — SALINE BACTERIOSTATIC 0.9 % IJ SOLN
10.0000 mL | Freq: Once | INTRAMUSCULAR | Status: DC
  Filled 2020-11-08: qty 10

## 2020-11-08 MED ORDER — LABETALOL HCL 5 MG/ML IV SOLN
10.0000 mg | INTRAVENOUS | Status: DC | PRN
  Administered 2020-11-08: 10 mg via INTRAVENOUS

## 2020-11-08 MED ORDER — AMLODIPINE BESYLATE 10 MG PO TABS: 10.0000 mg | ORAL_TABLET | Freq: Every day | ORAL | 1 refills | Status: DC

## 2020-11-08 MED ORDER — AMLODIPINE BESYLATE 10 MG PO TABS
10.0000 mg | ORAL_TABLET | Freq: Once | ORAL | Status: AC
  Administered 2020-11-08: 10 mg via ORAL
  Filled 2020-11-08: qty 2

## 2020-11-08 MED ORDER — LOSARTAN POTASSIUM 50 MG PO TABS
100.0000 mg | ORAL_TABLET | Freq: Every day | ORAL | Status: DC
  Administered 2020-11-08: 100 mg via ORAL
  Filled 2020-11-08: qty 2

## 2020-11-08 MED ORDER — IOHEXOL 350 MG/ML SOLN
75.0000 mL | Freq: Once | INTRAVENOUS | Status: AC | PRN
  Administered 2020-11-08: 75 mL via INTRAVENOUS

## 2020-11-08 MED ORDER — AMLODIPINE BESYLATE 10 MG PO TABS: 10.0000 mg | ORAL_TABLET | Freq: Every day | ORAL | 1 refills | Status: AC

## 2020-11-08 MED ORDER — CLONIDINE HCL 0.3 MG PO TABS
0.3000 mg | ORAL_TABLET | Freq: Two times a day (BID) | ORAL | Status: DC
  Administered 2020-11-08: 0.3 mg via ORAL
  Filled 2020-11-08: qty 1

## 2020-11-08 NOTE — Progress Notes (Signed)
OT Cancellation Note  Patient Details Name: Terri Chambers MRN: [id number] DOB: 1975/08/27   Cancelled Treatment:    Reason Eval/Treat Not Completed: OT screened, no needs identified, will sign off . Patient reports sensation impairments have resolved. Reports no physical deficits and has been ambulating and performing ADLs independently.  Lukka Black L Dainel Arcidiacono 11/08/2020, 2:35 PM

## 2020-11-08 NOTE — CV Procedure (Signed)
Carotid duplex completed.  Results can be found under chart review under CV PROC. 11/08/2020 12:43 PM Talmadge Ganas RVT, RDMS

## 2020-11-08 NOTE — Discharge Summary (Signed)
Physician Discharge Summary  Terri Chambers:[id number] DOB: 01-Feb-1976 DOA: 11/07/2020  PCP: Donnie Coffin, MD  Admit date: 11/07/2020 Discharge date: 11/08/2020  Admitted From: Home Disposition: Home  Recommendations for Outpatient Follow-up:  1. Follow ups as below. 2. Ambulatory referral to hypertension clinic with Dr. Oval Linsey 3. Please obtain CBC/BMP/Mag at follow up 4. Please follow up on the following pending results: None  Home Health: None required Equipment/Devices: None required  Discharge Condition: Stable CODE STATUS: Full code   Follow-up Information    Aycock, Ngwe A, MD. Schedule an appointment as soon as possible for a visit in 1 week(s).   Specialty: Family Medicine Contact information: [place] [address] [phone]               Hospital Course: 45 year old F with PMH of myasthenia gravis in "remission", resistant hypertension and morbid obesity presented with right shoulder blade pain, RUE numbness and tingling and right facial numbness, and admitted for CVA work-up and hypertensive urgency.  CT head without acute finding.  BP elevated to 195/124.  Neurology consulted and recommended complete CVA work-up.   The next day, MRI brain, CTA head and neck, TTE and carotid ultrasound without significant finding.  Patient symptoms resolved.  Blood pressure improved.  She was discharged on home atenolol, clonidine and losartan.  We added amlodipine to her home regimen.  She was given ambulatory referral to hypertension clinic with Dr. Oval Linsey.  She would definitely benefit from lifestyle change to lose weight and work-up for secondary hypertension.  She was given verbal and written resources for lifestyle change including diet and exercise.  See individual problem list below for more on hospital course.  Discharge Diagnoses:  Right shoulder blade pain/right arm and face numbness: Resolved.  CVA work-up unrevealing.  Could be due  to markedly elevated blood pressure.  Blood pressure improved.   Hypertensive urgency: Improved after restarting some of her blood pressure medications -Discharged on home atenolol, clonidine and losartan -Added amlodipine to her regimen.  Could benefit from evaluation for secondary hypertension -Ambulatory referral to hypertension clinic with Dr. Oval Linsey  History of myasthenia gravis: Reportedly in remission.  Currently not on medication.  Of note atenolol has the potential for myasthenia exacerbation but has been taking this prolonged.  Hypokalemia: K3.2.  Replenished prior to discharge.  Prediabetes: A1c 6.2%. -Lifestyle change including diet and exercise as below  Morbid obesity: Body mass index is 42.57 kg/m.  -Encourage lifestyle change to lose weight.           Discharge Exam: Vitals:   11/08/20 1204 11/08/20 1245  BP: (!) 189/130 (!) 158/92  Pulse: 80 80  Resp:  18  Temp:  98.2 F (36.8 C)  SpO2:  97%    GENERAL: No apparent distress.  Nontoxic. HEENT: MMM.  Vision and hearing grossly intact.  NECK: Supple.  No apparent JVD.  RESP: On RA.  No IWOB.  Fair aeration bilaterally. CVS:  RRR. Heart sounds normal.  ABD/GI/GU: Bowel sounds present. Soft. Non tender.  MSK/EXT:  Moves extremities. No apparent deformity. No edema.  SKIN: no apparent skin lesion or wound NEURO: Awake, alert and oriented appropriately. Speech clear. Cranial nerves II-XII intact. Motor 5/5 in all muscle groups of UE and LE bilaterally, Normal tone. Light sensation intact in all dermatomes of upper and lower ext bilaterally. Patellar reflex symmetric.  No pronator drift.  Finger to nose intact. PSYCH: Calm. Normal affect.   Discharge Instructions  Discharge Instructions    Ambulatory referral to [facility] - [facility] [place]   Complete by: As directed    Advanced Hypertension   Call MD for:  difficulty breathing, headache or visual disturbances   Complete by: As  directed    Call MD for:  extreme fatigue   Complete by: As directed    Call MD for:  persistant dizziness or light-headedness   Complete by: As directed    Call MD for:  severe uncontrolled pain   Complete by: As directed    Diet - low sodium heart healthy   Complete by: As directed    Discharge instructions   Complete by: As directed    It has been a pleasure taking care of you!  You were hospitalized due to pain in your right shoulder blade, right-sided numbness and markedly elevated blood pressure.  Your symptoms could be due to markedly elevated blood pressure.  Your symptoms resolved.  Your blood pressure improved.  Your test test including CAT scan, MRI brain and echocardiogram did not show anything significant.  We have added amlodipine to your home regimen for your blood pressure.  We have also sent a referral to cardiologist to help you with you blood pressure management.   In addition to medications, will recommend lifestyle change including diet and exercise as below.  Choose healthier foods such as 100% whole grains, vegetables, fruits, beans, nut seeds, olive oil, most vegetable oils, fat-free dietary, wild game and fish.  Avoid sweet tea, other sweetened beverages, soda, fruit juice, cold cereal and milk and trans fat.  Eat at least 3 meals and 1-2 snacks per day.  Aim for no more than 5 hours between eating.  Eat breakfast within one hour of getting up.   Exercise at least 150 minutes per week, including weight resistance exercises 3 or 4 times per week.  Try to lose at least 7-10% of your current body weight.  Limit your salt (Sodium) intake to less than 2 gm (2000 mg) a day if you have conditions such as  elevated blood pressure, heart failure...   You may also read about DASH and/or Mediterranean diet at the following web site if you have blood pressure or heart  condition. [email or web address]    Take care,   Increase activity slowly   Complete by: As directed      Allergies as of 11/08/2020   No Known Allergies     Medication List    STOP taking these medications   cloNIDine 0.1 MG tablet Commonly known as: CATAPRES   cloNIDine 0.3 MG tablet Commonly known as: CATAPRES     TAKE these medications   amLODipine 10 MG tablet Commonly known as: NORVASC Take 1 tablet (10 mg total) by mouth daily.   APPLE CIDER VINEGAR PO Take 2 tablets by mouth daily.   atenolol 100 MG tablet Commonly known as: TENORMIN Take 100 mg by mouth every morning.   cloNIDine 0.3 mg/24hr patch Commonly known as: CATAPRES - Dosed in mg/24 hr Apply 2 patch to skin once a week   HAIR SKIN & NAILS GUMMIES PO Take 2 tablets by mouth daily.   losartan 100 MG tablet Commonly known as: Cozaar Take 1 tablet (100 mg total) by mouth daily.   Ozempic (0.25 or 0.5 MG/DOSE) 2 MG/1.5ML Sopn Generic drug: Semaglutide(0.25 or 0.5MG /DOS) Inject 0.25 mg into the skin once a week.       Consultations:  Neurology  Procedures/Studies:  2D  Echo on 11/08/2020 1. Left ventricular ejection fraction, by estimation, is 60 to 65%. Left  ventricular ejection fraction by 3D volume is 63 %. The left ventricle has  normal function. The left ventricle has no regional wall motion  abnormalities. There is mild concentric  left ventricular hypertrophy. Left ventricular diastolic parameters are  consistent with Grade I diastolic dysfunction (impaired relaxation).  2. Right ventricular systolic function is normal. The right ventricular  size is normal.  3. The mitral valve is normal in structure. Trivial mitral valve  regurgitation.  4. The aortic valve was not well visualized. Aortic valve regurgitation  is not  visualized. No aortic stenosis is present.  5. The inferior vena cava is dilated in size with >50% respiratory  variability, suggesting right atrial pressure of 8 mmHg.  6. Agitated saline contrast bubble study was negative, with no evidence  of any interatrial shunt.    CT ANGIO HEAD W OR WO CONTRAST  Result Date: 11/08/2020 CLINICAL DATA:  Acute right facial numbness.  Headaches. EXAM: CT ANGIOGRAPHY HEAD AND NECK TECHNIQUE: Multidetector CT imaging of the head and neck was performed using the standard protocol during bolus administration of intravenous contrast. Multiplanar CT image reconstructions and MIPs were obtained to evaluate the vascular anatomy. Carotid stenosis measurements (when applicable) are obtained utilizing NASCET criteria, using the distal internal carotid diameter as the denominator. CONTRAST:  74mL OMNIPAQUE IOHEXOL 350 MG/ML SOLN COMPARISON:  Head CT from yesterday FINDINGS: CT HEAD FINDINGS Brain: No evidence of acute infarction, hemorrhage, hydrocephalus, extra-axial collection or mass lesion/mass effect. Vascular: No hyperdense vessel or unexpected calcification. Skull: Normal. Negative for fracture or focal lesion. Sinuses: Clear Orbits: Negative Review of the MIP images confirms the above findings CTA NECK FINDINGS Aortic arch: Normal.  Three vessel branching. Right carotid system: Vessels are smooth and widely patent. Mild ICA tortuosity towards the skull base. Left carotid system: No stenosis, beading, or ulceration. Vertebral arteries: No proximal subclavian stenosis. Partial obscuration of the left V1 segment due to artifact from intravenous contrast with streak. No detected stenosis or beading. Skeleton: Negative Other neck: Negative Upper chest: Negative Review of the MIP images confirms the above findings CTA HEAD FINDINGS Anterior circulation: Major vessels are smooth and widely patent. No branch occlusion, aneurysm, or visible vascular malformation. Posterior  circulation: The carotid and vertebral arteries are smoothly contoured and widely patent. No branch occlusion, beading, or aneurysm. Venous sinuses: Diffusely patent Anatomic variants: None significant Review of the MIP images confirms the above findings IMPRESSION: No emergent finding or arterial stenosis. Electronically Signed   By: Monte Fantasia M.D.   On: 11/08/2020 10:34   CT Head Wo Contrast  Result Date: 10/22/2020 CLINICAL DATA:  Migraine headaches for several hours EXAM: CT HEAD WITHOUT CONTRAST TECHNIQUE: Contiguous axial images were obtained from the base of the skull through the vertex without intravenous contrast. COMPARISON:  05/24/2017 FINDINGS: Brain: No evidence of acute infarction, hemorrhage, hydrocephalus, extra-axial collection or mass lesion/mass effect. Area of decreased attenuation is noted in the left cerebellar hemisphere but appears to be related to beam hardening artifact as it was present on prior exam. Vascular: No hyperdense vessel or unexpected calcification. Skull: Normal. Negative for fracture or focal lesion. Sinuses/Orbits: No acute finding. Other: None. IMPRESSION: No acute intracranial abnormality noted. Electronically Signed   By: Inez Catalina M.D.   On: 10/22/2020 15:49   CT ANGIO NECK W OR WO CONTRAST  Result Date: 11/08/2020 CLINICAL DATA:  Acute right facial numbness.  Headaches.  EXAM: CT ANGIOGRAPHY HEAD AND NECK TECHNIQUE: Multidetector CT imaging of the head and neck was performed using the standard protocol during bolus administration of intravenous contrast. Multiplanar CT image reconstructions and MIPs were obtained to evaluate the vascular anatomy. Carotid stenosis measurements (when applicable) are obtained utilizing NASCET criteria, using the distal internal carotid diameter as the denominator. CONTRAST:  68mL OMNIPAQUE IOHEXOL 350 MG/ML SOLN COMPARISON:  Head CT from yesterday FINDINGS: CT HEAD FINDINGS Brain: No evidence of acute infarction, hemorrhage,  hydrocephalus, extra-axial collection or mass lesion/mass effect. Vascular: No hyperdense vessel or unexpected calcification. Skull: Normal. Negative for fracture or focal lesion. Sinuses: Clear Orbits: Negative Review of the MIP images confirms the above findings CTA NECK FINDINGS Aortic arch: Normal.  Three vessel branching. Right carotid system: Vessels are smooth and widely patent. Mild ICA tortuosity towards the skull base. Left carotid system: No stenosis, beading, or ulceration. Vertebral arteries: No proximal subclavian stenosis. Partial obscuration of the left V1 segment due to artifact from intravenous contrast with streak. No detected stenosis or beading. Skeleton: Negative Other neck: Negative Upper chest: Negative Review of the MIP images confirms the above findings CTA HEAD FINDINGS Anterior circulation: Major vessels are smooth and widely patent. No branch occlusion, aneurysm, or visible vascular malformation. Posterior circulation: The carotid and vertebral arteries are smoothly contoured and widely patent. No branch occlusion, beading, or aneurysm. Venous sinuses: Diffusely patent Anatomic variants: None significant Review of the MIP images confirms the above findings IMPRESSION: No emergent finding or arterial stenosis. Electronically Signed   By: Monte Fantasia M.D.   On: 11/08/2020 10:34   MR BRAIN WO CONTRAST  Result Date: 11/08/2020 CLINICAL DATA:  Acute stroke suspected EXAM: MRI HEAD WITHOUT CONTRAST TECHNIQUE: Multiplanar, multiecho pulse sequences of the brain and surrounding structures were obtained without intravenous contrast. COMPARISON:  CT and CTA from earlier today. There is a brain MRI from 2018 but only sagittal T1 images are available. FINDINGS: Brain: No acute infarction, hemorrhage, hydrocephalus, extra-axial collection or mass lesion. Vascular: Normal flow voids. Skull and upper cervical spine: Normal marrow signal. Sinuses/Orbits: Negative. Other: Intermittent motion  artifact. IMPRESSION: Normal MRI of the brain. Electronically Signed   By: Monte Fantasia M.D.   On: 11/08/2020 10:49   DG Chest Portable 1 View  Result Date: 10/22/2020 CLINICAL DATA:  Shortness of breath EXAM: PORTABLE CHEST 1 VIEW COMPARISON:  10/04/2018 FINDINGS: The heart size and mediastinal contours are within normal limits. Both lungs are clear. The visualized skeletal structures are unremarkable. IMPRESSION: No active disease. Electronically Signed   By: Inez Catalina M.D.   On: 10/22/2020 15:37   ECHOCARDIOGRAM COMPLETE BUBBLE STUDY  Result Date: 11/08/2020    ECHOCARDIOGRAM REPORT   Patient Name:   Terri Chambers Date of Exam: 11/08/2020 Medical Rec #:  [id number]       Height:       68.0 in Accession #:    [id number]      Weight:       280.0 lb Date of Birth:  1976/05/16        BSA:          2.358 m Patient Age:    79 years        BP:           197/126 mmHg Patient Gender: F               HR:           84 bpm. Exam Location:  Inpatient Procedure: 2D Echo, 3D Echo, Cardiac Doppler, Color Doppler and Saline Contrast            Bubble Study Indications:    Stroke  History:        Patient has no prior history of Echocardiogram examinations.                 Risk Factors:Hypertension. Covid infection 2021.  Sonographer:    Roseanna Rainbow RDCS Referring Phys: [id number] Calcium  Sonographer Comments: Patient is morbidly obese. Image acquisition challenging due to patient body habitus. IMPRESSIONS  1. Left ventricular ejection fraction, by estimation, is 60 to 65%. Left ventricular ejection fraction by 3D volume is 63 %. The left ventricle has normal function. The left ventricle has no regional wall motion abnormalities. There is mild concentric left ventricular hypertrophy. Left ventricular diastolic parameters are consistent with Grade I diastolic dysfunction (impaired relaxation).  2. Right ventricular systolic function is normal. The right ventricular size is normal.  3. The mitral valve is normal in  structure. Trivial mitral valve regurgitation.  4. The aortic valve was not well visualized. Aortic valve regurgitation is not visualized. No aortic stenosis is present.  5. The inferior vena cava is dilated in size with >50% respiratory variability, suggesting right atrial pressure of 8 mmHg.  6. Agitated saline contrast bubble study was negative, with no evidence of any interatrial shunt. Comparison(s): No prior Echocardiogram. Conclusion(s)/Recommendation(s): No intracardiac source of embolism detected on this transthoracic study. A transesophageal echocardiogram is recommended to exclude cardiac source of embolism if clinically indicated. FINDINGS  Left Ventricle: Left ventricular ejection fraction, by estimation, is 60 to 65%. Left ventricular ejection fraction by 3D volume is 63 %. The left ventricle has normal function. The left ventricle has no regional wall motion abnormalities. The left ventricular internal cavity size was normal in size. There is mild concentric left ventricular hypertrophy. Left ventricular diastolic parameters are consistent with Grade I diastolic dysfunction (impaired relaxation). Normal left ventricular filling pressure. Right Ventricle: The right ventricular size is normal. No increase in right ventricular wall thickness. Right ventricular systolic function is normal. Left Atrium: Left atrial size was normal in size. Right Atrium: Right atrial size was normal in size. Pericardium: There is no evidence of pericardial effusion. Mitral Valve: The mitral valve is normal in structure. There is mild thickening of the mitral valve leaflet(s). Mild mitral annular calcification. Trivial mitral valve regurgitation. Tricuspid Valve: The tricuspid valve is normal in structure. Tricuspid valve regurgitation is trivial. Aortic Valve: The aortic valve was not well visualized. Aortic valve regurgitation is not visualized. No aortic stenosis is present. Pulmonic Valve: The pulmonic valve was not well  visualized. Pulmonic valve regurgitation is trivial. Aorta: The aortic root and ascending aorta are structurally normal, with no evidence of dilitation. Venous: The inferior vena cava is dilated in size with greater than 50% respiratory variability, suggesting right atrial pressure of 8 mmHg. IAS/Shunts: No atrial level shunt detected by color flow Doppler. Agitated saline contrast was given intravenously to evaluate for intracardiac shunting. Agitated saline contrast bubble study was negative, with no evidence of any interatrial shunt.  LEFT VENTRICLE PLAX 2D LVIDd:         4.30 cm         Diastology LVIDs:         2.70 cm         LV e' medial:    9.14 cm/s LV PW:  1.60 cm         LV E/e' medial:  8.2 LV IVS:        1.50 cm         LV e' lateral:   10.90 cm/s LVOT diam:     1.70 cm         LV E/e' lateral: 6.9 LV SV:         59 LV SV Index:   25 LVOT Area:     2.27 cm        3D Volume EF                                LV 3D EF:    Left                                             ventricular LV Volumes (MOD)                            ejection LV vol d, MOD    97.7 ml                    fraction by A2C:                                        3D volume LV vol d, MOD    117.0 ml                   is 63 %. A4C: LV vol s, MOD    18.8 ml A2C:                           3D Volume EF: LV vol s, MOD    40.4 ml       3D EF:        63 % A4C: LV SV MOD A2C:   78.9 ml LV SV MOD A4C:   117.0 ml LV SV MOD BP:    80.7 ml RIGHT VENTRICLE             IVC RV S prime:     10.33 cm/s  IVC diam: 2.20 cm TAPSE (M-mode): 2.3 cm LEFT ATRIUM             Index       RIGHT ATRIUM           Index LA diam:        3.10 cm 1.31 cm/m  RA Area:     14.70 cm LA Vol (A2C):   28.8 ml 12.21 ml/m RA Volume:   32.40 ml  13.74 ml/m LA Vol (A4C):   32.1 ml 13.61 ml/m LA Biplane Vol: 30.5 ml 12.94 ml/m  AORTIC VALVE LVOT Vmax:   130.00 cm/s LVOT Vmean:  96.800 cm/s LVOT VTI:    0.258 m  AORTA Ao Root diam: 2.90 cm Ao Asc diam:  3.20 cm MITRAL  VALVE MV Area (PHT): 2.50 cm    SHUNTS MV Decel Time: 303 msec    Systemic VTI:  0.26 m MV E velocity: 75.00 cm/s  Systemic Diam: 1.70 cm MV A velocity: 75.80  cm/s MV E/A ratio:  0.99 Gwyndolyn Kaufman MD Electronically signed by Gwyndolyn Kaufman MD Signature Date/Time: 11/08/2020/2:24:39 PM    Final    CT Angio Chest/Abd/Pel for Dissection W and/or W/WO  Result Date: 11/07/2020 CLINICAL DATA:  Chest and back pain.  Aortic dissection protocol. EXAM: CT ANGIOGRAPHY CHEST, ABDOMEN AND PELVIS TECHNIQUE: Non-contrast CT of the chest was initially obtained. Multidetector CT imaging through the chest, abdomen and pelvis was performed using the standard protocol during bolus administration of intravenous contrast. Multiplanar reconstructed images and MIPs were obtained and reviewed to evaluate the vascular anatomy. CONTRAST:  163mL OMNIPAQUE IOHEXOL 350 MG/ML SOLN COMPARISON:  CT scan 06/13/2018 FINDINGS: CTA CHEST FINDINGS Cardiovascular: The heart is borderline enlarged for age. No pericardial effusion. The aorta is normal in caliber. No dissection. No atherosclerotic calcification. The branch vessels are patent. No coronary artery calcifications are identified. The pulmonary arteries are unremarkable. Mediastinum/Nodes: No mediastinal or hilar mass or lymphadenopathy. The esophagus is grossly normal. Lungs/Pleura: A few patchy areas of subpleural atelectasis but no pulmonary infiltrates or pleural effusions. No worrisome pulmonary lesions. Musculoskeletal: No breast masses are identified. There is a borderline enlarged left axillary lymph node which measures up to 16 mm. I do not see this on the prior studies. Recommend correlation with physical examination and mammography if indicated. The bony thorax is intact. Review of the MIP images confirms the above findings. CTA ABDOMEN AND PELVIS FINDINGS VASCULAR Aorta: Normal caliber. No dissection. No atherosclerotic calcifications. Celiac: Normal SMA: Normal Renals:  Normal IMA: Normal Inflow: Normal Veins: Normal Review of the MIP images confirms the above findings. NON-VASCULAR Hepatobiliary: No hepatic lesions are identified. No intrahepatic biliary dilatation. The gallbladder is grossly normal. No common bile duct dilatation. Pancreas: No mass, inflammation or ductal dilatation. Spleen: Normal size.  No focal lesions. Adrenals/Urinary Tract: The adrenal glands and kidneys are unremarkable. The bladder is unremarkable. Stomach/Bowel: The stomach, duodenum, small bowel and colon are grossly normal without oral contrast. No inflammatory changes, mass lesions or obstructive findings. The appendix is normal. Lymphatic: No mesenteric or retroperitoneal mass or adenopathy. Reproductive: The uterus and left ovary are unremarkable. 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 Prior large uterine fibroids have been resected. Other: Moderate-sized periumbilical abdominal wall hernia containing fat. Musculoskeletal: No significant bony findings. Review of the MIP images confirms the above findings. IMPRESSION: 1. Normal thoracic and abdominal aorta. No aneurysm or dissection. 2. No acute pulmonary findings. Minimal patchy areas of subpleural atelectasis. 3. Borderline enlarged left axillary lymph node. Recommend correlation with physical examination and mammography if indicated. 4. No acute abdominal/pelvic findings, mass lesions or lymphadenopathy. 5. Moderate-sized periumbilical abdominal wall hernia containing fat. Electronically Signed   By: Marijo Sanes M.D.   On: 11/07/2020 18:09   CT HEAD CODE STROKE WO CONTRAST  Result Date: 11/07/2020 CLINICAL DATA:  Code stroke. Initial evaluation for neuro deficit, stroke suspected. EXAM: CT HEAD WITHOUT CONTRAST TECHNIQUE: Contiguous axial images were obtained from the base of the skull through the vertex without intravenous contrast. COMPARISON:  Prior CT from 10/22/2020. FINDINGS: Brain: Cerebral volume within  normal limits. No acute intracranial hemorrhage. No acute large vessel territory infarct. No mass lesion, midline shift or mass effect. No hydrocephalus. Slight asymmetry of the lateral ventricles noted, stable from previous. No extra-axial fluid collection. Vascular: No hyperdense vessel. Skull: Scalp soft tissues and calvarium within normal limits. Sinuses/Orbits: Globes and orbital soft tissues within normal limits. Paranasal sinuses and mastoid air cells are  clear. Other: None. ASPECTS [facility] Stroke Program Early CT Score) - Ganglionic level infarction (caudate, lentiform nuclei, internal capsule, insula, M1-M3 cortex): 7 - Supraganglionic infarction (M4-M6 cortex): 3 Total score (0-10 with 10 being normal): 10 IMPRESSION: 1. Negative head CT.  No acute intracranial abnormality. 2. ASPECTS is 10. Critical Value/emergent results were called by telephone at the time of interpretation on 11/07/2020 at 2:19 pm to provider [facility] , who verbally acknowledged these results. Electronically Signed   By: Jeannine Boga M.D.   On: 11/07/2020 14:21   VAS US CAROTID (at [facility] and [facility] only)  Result Date: 11/08/2020 Carotid Arterial Duplex Study Indications:       Right side facial and arm tingling/numbness. Risk Factors:      Hypertension, past history of smoking. Other Factors:     Obesity, HX of myasthenia gravis. Limitations        Today's exam was limited due to the patient's respiratory                    variation. Comparison Study:  No previous exams Performing Technologist: Rogelia Rohrer  Examination Guidelines: A complete evaluation includes B-mode imaging, spectral Doppler, color Doppler, and power Doppler as needed of all accessible portions of each vessel. Bilateral testing is considered an integral part of a complete examination. Limited examinations for reoccurring indications may be performed as noted.  Right Carotid Findings:  +----------+--------+--------+--------+------------------+------------------+           PSV cm/sEDV cm/sStenosisPlaque DescriptionComments           +----------+--------+--------+--------+------------------+------------------+ CCA Prox  96      18                                intimal thickening +----------+--------+--------+--------+------------------+------------------+ CCA Distal88      19                                                   +----------+--------+--------+--------+------------------+------------------+ ICA Prox  51      15                                                   +----------+--------+--------+--------+------------------+------------------+ ICA Distal83      32                                                   +----------+--------+--------+--------+------------------+------------------+ ECA       91      14                                                   +----------+--------+--------+--------+------------------+------------------+ +----------+--------+-------+----------------+-------------------+           PSV cm/sEDV cmsDescribe        Arm Pressure (mmHG) +----------+--------+-------+----------------+-------------------+ [id number]             Multiphasic, WNL                    +----------+--------+-------+----------------+-------------------+ +---------+--------+--------+---------------------+  [place] cm/sEDV cm/sNot seen on this exam +---------+--------+--------+---------------------+  Left Carotid Findings: +----------+--------+--------+--------+------------------+------------------+           PSV cm/sEDV cm/sStenosisPlaque DescriptionComments           +----------+--------+--------+--------+------------------+------------------+ CCA Prox  102     28                                intimal thickening +----------+--------+--------+--------+------------------+------------------+ CCA Distal84      27                                                    +----------+--------+--------+--------+------------------+------------------+ ICA Prox  61      30                                                   +----------+--------+--------+--------+------------------+------------------+ ICA Distal68      23                                                   +----------+--------+--------+--------+------------------+------------------+ ECA       77      14                                                   +----------+--------+--------+--------+------------------+------------------+ +----------+--------+--------+----------------+-------------------+           PSV cm/sEDV cm/sDescribe        Arm Pressure (mmHG) +----------+--------+--------+----------------+-------------------+ [id number]      0       Multiphasic, WNL                    +----------+--------+--------+----------------+-------------------+ +---------+--------+--+--------+--+---------+ VertebralPSV cm/s57EDV cm/s20Antegrade +---------+--------+--+--------+--+---------+   Summary: Right Carotid: The extracranial vessels were near-normal with only minimal wall                thickening or plaque. Left Carotid: The extracranial vessels were near-normal with only minimal wall               thickening or plaque. Vertebrals:  Left vertebral artery demonstrates antegrade flow. Right vertebral              artery was not visualized. Subclavians: Normal flow hemodynamics were seen in bilateral subclavian              arteries. *See table(s) above for measurements and observations.  Electronically signed by Deitra Mayo MD on 11/08/2020 at 1:53:15 PM.    Final         The results of significant diagnostics from this hospitalization (including imaging, microbiology, ancillary and laboratory) are listed below for reference.     Microbiology: Recent Results (from the past 240 hour(s))  Resp Panel by RT-PCR (Flu A&B, Covid)  Nasopharyngeal Swab     Status: None   Collection Time: 11/07/20  6:30 PM   Specimen: Nasopharyngeal Swab; Nasopharyngeal(NP) swabs in vial transport medium  Result Value Ref Range Status   SARS Coronavirus 2 by RT PCR NEGATIVE NEGATIVE Final    Comment: (NOTE) SARS-CoV-2 target nucleic acids are NOT DETECTED.  The SARS-CoV-2 RNA is generally detectable in upper respiratory specimens during the acute phase of infection. The lowest concentration of SARS-CoV-2 viral copies this assay can detect is 138 copies/mL. A negative result does not preclude SARS-Cov-2 infection and should not be used as the sole basis for treatment or other patient management decisions. A negative result may occur with  improper specimen collection/handling, submission of specimen other than nasopharyngeal swab, presence of viral mutation(s) within the areas targeted by this assay, and inadequate number of viral copies(<138 copies/mL). A negative result must be combined with clinical observations, patient history, and epidemiological information. The expected result is Negative.  Fact Sheet for Patients:  EntrepreneurPulse.com.au  Fact Sheet for Healthcare Providers:  IncredibleEmployment.be  This test is no t yet approved or cleared by the [place] FDA and  has been authorized for detection and/or diagnosis of SARS-CoV-2 by FDA under an Emergency Use Authorization (EUA). This EUA will remain  in effect (meaning this test can be used) for the duration of the COVID-19 declaration under Section 564(b)(1) of the Act, 21 U.S.C.section 360bbb-3(b)(1), unless the authorization is terminated  or revoked sooner.       Influenza A by PCR NEGATIVE NEGATIVE Final   Influenza B by PCR NEGATIVE NEGATIVE Final    Comment: (NOTE) The Xpert Xpress SARS-CoV-2/FLU/RSV plus assay is intended as an aid in the diagnosis of influenza from Nasopharyngeal swab specimens and should not be  used as a sole basis for treatment. Nasal washings and aspirates are unacceptable for Xpert Xpress SARS-CoV-2/FLU/RSV testing.  Fact Sheet for Patients: EntrepreneurPulse.com.au  Fact Sheet for Healthcare Providers: IncredibleEmployment.be  This test is not yet approved or cleared by the [place] FDA and has been authorized for detection and/or diagnosis of SARS-CoV-2 by FDA under an Emergency Use Authorization (EUA). This EUA will remain in effect (meaning this test can be used) for the duration of the COVID-19 declaration under Section 564(b)(1) of the Act, 21 U.S.C. section 360bbb-3(b)(1), unless the authorization is terminated or revoked.  Performed at [facility], [place] [address], [place], [place] [address]      Labs:  CBC: Recent Labs  Lab 11/07/20 1402 11/07/20 1411 11/08/20 0322  WBC 6.6  --  5.7  NEUTROABS 3.4  --   --   HGB 12.7 12.9 12.1  HCT 38.6 38.0 37.0  MCV 88.1  --  88.3  PLT 361  --  316   BMP &GFR Recent Labs  Lab 11/07/20 1402 11/07/20 1411 11/08/20 0322  NA 139 139 139  K 3.3* 3.3* 3.2*  CL 104 101 103  CO2 27  --  26  GLUCOSE 103* 98 107*  BUN 10 8 10   CREATININE 0.64 0.70 0.63  CALCIUM 9.0  --  9.1  MG  --   --  1.8   Estimated Creatinine Clearance: 127.5 mL/min (by C-G formula based on SCr of 0.63 mg/dL). Liver & Pancreas: Recent Labs  Lab 11/07/20 1402 11/08/20 0322  AST 17 16  ALT 20 18  ALKPHOS 59 53  BILITOT 0.7 0.4  PROT 7.4 6.6  ALBUMIN 3.8 3.5   No results for input(s): LIPASE, AMYLASE in the last 168 hours. No results for input(s): AMMONIA in the last 168 hours. Diabetic: Recent Labs    11/08/20 0322  HGBA1C 6.2*  Recent Labs  Lab 11/07/20 1356  GLUCAP 99   Cardiac Enzymes: No results for input(s): CKTOTAL, CKMB, CKMBINDEX, TROPONINI in the last 168 hours. No results for input(s): PROBNP in the last 8760 hours. Coagulation Profile: Recent Labs   Lab 11/07/20 1402 11/08/20 0322  INR 1.0 1.1   Thyroid Function Tests: Recent Labs    11/08/20 0855  TSH 1.581   Lipid Profile: Recent Labs    11/08/20 0322  CHOL 149  HDL 36*  LDLCALC 95  TRIG 88  CHOLHDL 4.1   Anemia Panel: No results for input(s): VITAMINB12, FOLATE, FERRITIN, TIBC, IRON, RETICCTPCT in the last 72 hours. Urine analysis:    Component Value Date/Time   COLORURINE YELLOW 11/07/2020 [place] 11/07/2020 1442   APPEARANCEUR Hazy 05/10/2014 1237   LABSPEC 1.010 11/07/2020 1442   LABSPEC 1.018 05/10/2014 1237   PHURINE 7.0 11/07/2020 1442   GLUCOSEU NEGATIVE 11/07/2020 1442   GLUCOSEU Negative 05/10/2014 1237   HGBUR NEGATIVE 11/07/2020 1442   BILIRUBINUR NEGATIVE 11/07/2020 1442   BILIRUBINUR Negative 05/10/2014 1237   KETONESUR NEGATIVE 11/07/2020 1442   PROTEINUR NEGATIVE 11/07/2020 1442   UROBILINOGEN 0.2 02/18/2012 1643   NITRITE NEGATIVE 11/07/2020 1442   LEUKOCYTESUR SMALL (A) 11/07/2020 1442   LEUKOCYTESUR Trace 05/10/2014 1237   Sepsis Labs: Invalid input(s): PROCALCITONIN, LACTICIDVEN   Time coordinating discharge: 35 minutes  SIGNED:  Mercy Riding, MD  [facility] 11/08/2020, 3:12 PM  If 7PM-7AM, please contact night-coverage [email or web address]

## 2020-11-08 NOTE — Progress Notes (Signed)
CTA and MRI brain unremarkable. Treat as HTN urgency No inpatient neurological work up  -- Amie Portland, MD Neurologist  [facility] Pager: [phone]

## 2020-11-08 NOTE — Progress Notes (Signed)
PT Cancellation Note  Patient Details Name: Terri Chambers MRN: [id number] DOB: 07/07/1976   Cancelled Treatment:    Reason Eval/Treat Not Completed: PT screened, no needs identified, will sign off. Pt states all tingling in RUE gone, states "no balance or physical" deficits, all imaging negative. Pt politely declines PT eval, concerned regarding high BP so notified RN.    Talbot Grumbling PT, DPT 11/08/20, 3:16 PM

## 2020-11-08 NOTE — Progress Notes (Signed)
  Echocardiogram 2D Echocardiogram has been performed.  [name] 11/08/2020, 2:19 PM

## 2020-12-07 ENCOUNTER — Encounter: Payer: Self-pay | Admitting: Cardiology

## 2020-12-07 NOTE — Progress Notes (Deleted)
Cardiology Office Note   Date:  12/07/2020   ID:  Terri Chambers, DOB Dec 26, 1975, MRN [id number]  PCP:  Donnie Coffin, MD  Cardiologist:   None Referring:  ***  No chief complaint on file.     History of Present Illness: Terri Chambers is a 45 y.o. female who for new patient evaluation of hypertensive urgency.  She was in the ED for this and was admitted last month. I reviewed these records for this visit.    She had some arm tingling and her blood pressure was 195/124 on presentation.  Neurology saw her.  MRI, CT of the head and neck, transthoracic echo and carotid ultrasound demonstrated no significant findings.  She was discharged on meds as below.  She was taken off of clonidine oral.  She presents for follow-up. ***   ***  195/124.  Neurology consulted and recommended complete CVA work-up.   The next day, MRI brain, CTA head and neck, TTE and carotid ultrasound without significant finding.  Patient symptoms resolved.  Blood pressure improved.  She was discharged on home atenolol, clonidine and losartan.  We added amlodipine to her home regimen.  She was given ambulatory referral to hypertension clinic with Dr. Oval Linsey   Past Medical History:  Diagnosis Date  . Anemia    WITH PREGNANCY  . Asthma    AS A CHILD  . Depression   . History of asthma   . History of plasmapheresis   . Hypertension   . Migraine headache   . Morbid obesity ([place])   . Myasthenia gravis   . Myasthenia gravis ([place]) 2005    Past Surgical History:  Procedure Laterality Date  . EAR TUBE REMOVAL    . MYOMECTOMY N/A 08/21/2018   Procedure: ABDOMINAL MYOMECTOMY;  Surgeon: Rubie Maid, MD;  Location: [facility] ORS;  Service: Gynecology;  Laterality: N/A;  . TYMPANOSTOMY TUBE PLACEMENT    . UMBILICAL HERNIA REPAIR N/A 08/21/2018   Procedure: HERNIA REPAIR UMBILICAL ADULT;  Surgeon: Fredirick Maudlin, MD;  Location: [facility] ORS;  Service: General;  Laterality: N/A;     Current Outpatient Medications   Medication Sig Dispense Refill  . amLODipine (NORVASC) 10 MG tablet Take 1 tablet (10 mg total) by mouth daily. 90 tablet 1  . APPLE CIDER VINEGAR PO Take 2 tablets by mouth daily.    Marland Kitchen atenolol (TENORMIN) 100 MG tablet Take 100 mg by mouth every morning.     . Biotin w/ Vitamins C & E (HAIR SKIN & NAILS GUMMIES PO) Take 2 tablets by mouth daily.    . cloNIDine (CATAPRES - DOSED IN MG/24 HR) 0.3 mg/24hr patch Apply 2 patch to skin once a week    . losartan (COZAAR) 100 MG tablet Take 1 tablet (100 mg total) by mouth daily. 10 tablet 0  . OZEMPIC, 0.25 OR 0.5 MG/DOSE, 2 MG/1.5ML SOPN Inject 0.25 mg into the skin once a week.     No current facility-administered medications for this visit.    Allergies:   Patient has no known allergies.    Social History:  The patient  reports that she quit smoking about 12 years ago. Her smoking use included cigarettes. She smoked 0.50 packs per day. She has never used smokeless tobacco. She reports that she does not drink alcohol and does not use drugs.   Family History:  The patient's ***family history includes Arthritis in her father; Cerebral aneurysm in her maternal grandmother; Cervical cancer in an other family  member; Congestive Heart Failure in her paternal grandmother; Diabetes in her mother; Hypertension in her father.    ROS:  Please see the history of present illness.   Otherwise, review of systems are positive for {NONE DEFAULTED:18576::"none"}.   All other systems are reviewed and negative.    PHYSICAL EXAM: VS:  There were no vitals taken for this visit. , BMI There is no height or weight on file to calculate BMI. GENERAL:  Well appearing HEENT:  Pupils equal round and reactive, fundi not visualized, oral mucosa unremarkable NECK:  No jugular venous distention, waveform within normal limits, carotid upstroke brisk and symmetric, no bruits, no thyromegaly LYMPHATICS:  No cervical, inguinal adenopathy LUNGS:  Clear to auscultation  bilaterally BACK:  No CVA tenderness CHEST:  Unremarkable HEART:  PMI not displaced or sustained,S1 and S2 within normal limits, no S3, no S4, no clicks, no rubs, *** murmurs ABD:  Flat, positive bowel sounds normal in frequency in pitch, no bruits, no rebound, no guarding, no midline pulsatile mass, no hepatomegaly, no splenomegaly EXT:  2 plus pulses throughout, no edema, no cyanosis no clubbing SKIN:  No rashes no nodules NEURO:  Cranial nerves II through XII grossly intact, motor grossly intact throughout PSYCH:  Cognitively intact, oriented to person place and time    EKG:  EKG {ACTION; IS/IS ELF:81017510} ordered today. The ekg ordered today demonstrates ***   Recent Labs: 11/08/2020: ALT 18; BUN 10; Creatinine, Ser 0.63; Hemoglobin 12.1; Magnesium 1.8; Platelets 316; Potassium 3.2; Sodium 139; TSH 1.581    Lipid Panel    Component Value Date/Time   CHOL 149 11/08/2020 0322   TRIG 88 11/08/2020 0322   HDL 36 (L) 11/08/2020 0322   CHOLHDL 4.1 11/08/2020 0322   VLDL 18 11/08/2020 0322   LDLCALC 95 11/08/2020 0322      Wt Readings from Last 3 Encounters:  11/07/20 280 lb (127 kg)  10/22/20 294 lb (133.4 kg)  09/23/20 290 lb (131.5 kg)      Other studies Reviewed: Additional studies/ records that were reviewed today include: ***. Review of the above records demonstrates:  Please see elsewhere in the note.  ***   ASSESSMENT AND PLAN:  HYPERTENSIVE URGENCY:  ***   Current medicines are reviewed at length with the patient today.  The patient {ACTIONS; HAS/DOES NOT HAVE:19233} concerns regarding medicines.  The following changes have been made:  {PLAN; NO CHANGE:13088:s}  Labs/ tests ordered today include: *** No orders of the defined types were placed in this encounter.    Disposition:   FU with ***    Signed, Minus Breeding, MD  12/07/2020 6:28 PM    [facility]

## 2020-12-09 ENCOUNTER — Ambulatory Visit: Payer: Self-pay | Admitting: Cardiology

## 2020-12-09 DIAGNOSIS — I160 Hypertensive urgency: Secondary | ICD-10-CM

## 2021-01-22 NOTE — Progress Notes (Deleted)
Cardiology Office Note   Date:  01/22/2021   ID:  Terri Chambers, DOB [birth year], MRN [id number]  PCP:  Terri Coffin, MD  Cardiologist:   None Referring:  ***  No chief complaint on file.     History of Present Illness: Terri Chambers is a 45 y.o. female who presents for ***    She was in the hospital in April with hypertensive urgency.   She had a questionable stroke.  ***     The next day, MRI brain, CTA head and neck, TTE and carotid ultrasound without significant finding.  Patient symptoms resolved.  Blood pressure improved.  She was discharged on home atenolol, clonidine and losartan.  We added amlodipine to her home regimen.  She was given ambulatory referral to hypertension clinic with Dr. Oval Linsey.  She would definitely benefit from lifestyle change to lose weight and work-up for secondary hypertension.  She was given verbal and written resources for lifestyle change including diet and exercise.  Past Medical History:  Diagnosis Date   Anemia    WITH PREGNANCY   Depression    History of asthma    History of plasmapheresis    Hypertension    Migraine headache    Morbid obesity ([place])    Myasthenia gravis ([place]) 2005    Past Surgical History:  Procedure Laterality Date   EAR TUBE REMOVAL     MYOMECTOMY N/A 08/21/2018   Procedure: ABDOMINAL MYOMECTOMY;  Surgeon: Rubie Maid, MD;  Location: [facility] ORS;  Service: Gynecology;  Laterality: N/A;   TYMPANOSTOMY TUBE PLACEMENT     UMBILICAL HERNIA REPAIR N/A 08/21/2018   Procedure: HERNIA REPAIR UMBILICAL ADULT;  Surgeon: Fredirick Maudlin, MD;  Location: [facility] ORS;  Service: General;  Laterality: N/A;     Current Outpatient Medications  Medication Sig Dispense Refill   amLODipine (NORVASC) 10 MG tablet Take 1 tablet (10 mg total) by mouth daily. 90 tablet 1   APPLE CIDER VINEGAR PO Take 2 tablets by mouth daily.     atenolol (TENORMIN) 100 MG tablet Take 100 mg by mouth every morning.      Biotin w/ Vitamins C & E  (HAIR SKIN & NAILS GUMMIES PO) Take 2 tablets by mouth daily.     cloNIDine (CATAPRES - DOSED IN MG/24 HR) 0.3 mg/24hr patch Apply 2 patch to skin once a week     losartan (COZAAR) 100 MG tablet Take 1 tablet (100 mg total) by mouth daily. 10 tablet 0   OZEMPIC, 0.25 OR 0.5 MG/DOSE, 2 MG/1.5ML SOPN Inject 0.25 mg into the skin once a week.     No current facility-administered medications for this visit.    Allergies:   Patient has no known allergies.    Social History:  The patient  reports that she quit smoking about 12 years ago. Her smoking use included cigarettes. She smoked an average of 0.50 packs per day. She has never used smokeless tobacco. She reports that she does not drink alcohol and does not use drugs.   Family History:  The patient's ***family history includes Arthritis in her father; Cerebral aneurysm in her maternal grandmother; Cervical cancer in an other family member; Congestive Heart Failure in her paternal grandmother; Diabetes in her mother; Hypertension in her father.    ROS:  Please see the history of present illness.   Otherwise, review of systems are positive for {NONE DEFAULTED:18576}.   All other systems are reviewed and negative.    PHYSICAL EXAM:  VS:  There were no vitals taken for this visit. , BMI There is no height or weight on file to calculate BMI. GENERAL:  Well appearing HEENT:  Pupils equal round and reactive, fundi not visualized, oral mucosa unremarkable NECK:  No jugular venous distention, waveform within normal limits, carotid upstroke brisk and symmetric, no bruits, no thyromegaly LYMPHATICS:  No cervical, inguinal adenopathy LUNGS:  Clear to auscultation bilaterally BACK:  No CVA tenderness CHEST:  Unremarkable HEART:  PMI not displaced or sustained,S1 and S2 within normal limits, no S3, no S4, no clicks, no rubs, *** murmurs ABD:  Flat, positive bowel sounds normal in frequency in pitch, no bruits, no rebound, no guarding, no midline  pulsatile mass, no hepatomegaly, no splenomegaly EXT:  2 plus pulses throughout, no edema, no cyanosis no clubbing SKIN:  No rashes no nodules NEURO:  Cranial nerves II through XII grossly intact, motor grossly intact throughout PSYCH:  Cognitively intact, oriented to person place and time    EKG:  EKG {ACTION; IS/IS UTM:[id number]} ordered today. The ekg ordered today demonstrates ***   Recent Labs: 11/08/2020: ALT 18; BUN 10; Creatinine, Ser 0.63; Hemoglobin 12.1; Magnesium 1.8; Platelets 316; Potassium 3.2; Sodium 139; TSH 1.581    Lipid Panel    Component Value Date/Time   CHOL 149 11/08/2020 0322   TRIG 88 11/08/2020 0322   HDL 36 (L) 11/08/2020 0322   CHOLHDL 4.1 11/08/2020 0322   VLDL 18 11/08/2020 0322   LDLCALC 95 11/08/2020 0322      Wt Readings from Last 3 Encounters:  11/07/20 280 lb (127 kg)  10/22/20 294 lb (133.4 kg)  09/23/20 290 lb (131.5 kg)      Other studies Reviewed: Additional studies/ records that were reviewed today include: ***. Review of the above records demonstrates:  Please see elsewhere in the note.  ***   ASSESSMENT AND PLAN:  Right shoulder blade pain/right arm and face numbness:  ***  Resolved.  CVA work-up unrevealing.  Could be due to markedly elevated blood pressure.  Blood pressure improved.   Hypertensive urgency:  ***  Improved after restarting some of her blood pressure medications -Discharged on home atenolol, clonidine and losartan -Added amlodipine to her regimen.  Could benefit from evaluation for secondary hypertension -Ambulatory referral to hypertension clinic with Dr. Oval Linsey   Prediabetes: A1c 6.2%. ***  -Lifestyle change including diet and exercise as below   Morbid obesity: ***  Body mass index is 42.57 kg/m.  -Encourage lifestyle change to lose weight   Current medicines are reviewed at length with the patient today.  The patient {ACTIONS; HAS/DOES NOT HAVE:19233} concerns regarding medicines.  The following  changes have been made:  {PLAN; NO CHANGE:13088:s}  Labs/ tests ordered today include: *** No orders of the defined types were placed in this encounter.    Disposition:   FU with ***    Signed, Minus Breeding, MD  01/22/2021 3:54 PM    [facility]

## 2021-01-23 ENCOUNTER — Ambulatory Visit: Payer: Self-pay | Admitting: Cardiology

## 2021-03-25 NOTE — Progress Notes (Signed)
Cardiology Office Note   Date:  03/26/2021   ID:  Terri Chambers, DOB 1975-11-05, MRN [id number]  PCP:  Donnie Coffin, MD  Cardiologist:   None Referring Mercy Riding, MD  Chief Complaint  Patient presents with   Hypertension       History of Present Illness: Terri Chambers is a 45 y.o. female who presents for evaluation of hypertensive urgency.  She was in the hospital recently for this this was in April.  She had neurologic symptoms to include some numbness and tingling.  She had hypertensive urgency.  She had a work-up to include MRI of the brain that was normal.  CT head and neck was unremarkable.  Carotid ultrasounds were unremarkable.  Transthoracic echo demonstrated no abnormalities including a negative bubble study.  I reviewed these records for this visit.  She thinks that she was having a reaction to her clonidine patch and she shows me a severe rash that she had on her arms.  She was sent home with atenolol, Cozaar, clonidine patch and amlodipine was added.  She is not taking the Cozaar currently.  She persisted with the clonidine patch and her rash went away.  Her blood pressure seem to be better controlled now.  She is really working on weight loss with diet and exercise.  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  She is now more active and trying to watch diet and increase activity.  She would like some help with this.  She denies any chest pressure, neck or arm discomfort.  There is no new palpitations, presyncope or syncope  Past Medical History:  Diagnosis Date   Anemia    WITH PREGNANCY   Depression    History of asthma    History of plasmapheresis    Hypertension    Migraine headache    Morbid obesity ([place])    Myasthenia gravis ([place]) 2005    Past Surgical History:  Procedure Laterality Date   EAR TUBE REMOVAL     MYOMECTOMY N/A 08/21/2018   Procedure: ABDOMINAL  MYOMECTOMY;  Surgeon: Rubie Maid, MD;  Location: [facility] ORS;  Service: Gynecology;  Laterality: N/A;   TYMPANOSTOMY TUBE PLACEMENT     UMBILICAL HERNIA REPAIR N/A 08/21/2018   Procedure: HERNIA REPAIR UMBILICAL ADULT;  Surgeon: Fredirick Maudlin, MD;  Location: [facility] ORS;  Service: General;  Laterality: N/A;     Current Outpatient Medications  Medication Sig Dispense Refill   amLODipine (NORVASC) 10 MG tablet Take 1 tablet (10 mg total) by mouth daily. 90 tablet 1   atenolol (TENORMIN) 100 MG tablet Take 100 mg by mouth every morning.      cloNIDine (CATAPRES - DOSED IN MG/24 HR) 0.3 mg/24hr patch Apply 2 patch to skin once a week     OZEMPIC, 0.25 OR 0.5 MG/DOSE, 2 MG/1.5ML SOPN Inject 0.25 mg into the skin once a week.     No current facility-administered medications for this visit.    Allergies:   Patient has no known allergies.    Social History:  The patient  reports that she quit smoking about 12 years ago. Her smoking use included cigarettes. She smoked an average of .5 packs per day. She has never used smokeless tobacco. She reports that she does not drink alcohol and does not use drugs.   Family History:  The patient's family history  includes Arthritis in her father; Cerebral aneurysm in her maternal grandmother; Cervical cancer in an other family member; Congestive Heart Failure in her paternal grandmother; Diabetes in her mother; Hypertension in her father.    ROS:  Please see the history of present illness.   Otherwise, review of systems are positive for none.   All other systems are reviewed and negative.    PHYSICAL EXAM: VS:  BP 112/82 (BP Location: Right Arm)   Pulse 68   Ht '5\' 8"'$  (1.727 m)   Wt 296 lb (134.3 kg)   SpO2 99%   BMI 45.01 kg/m  , BMI Body mass index is 45.01 kg/m. GENERAL:  Well appearing HEENT:  Pupils equal round and reactive, fundi not visualized, oral mucosa unremarkable NECK:  No jugular venous distention, waveform within normal limits, carotid  upstroke brisk and symmetric, no bruits, no thyromegaly LYMPHATICS:  No cervical, inguinal adenopathy LUNGS:  Clear to auscultation bilaterally BACK:  No CVA tenderness CHEST:  Unremarkable HEART:  PMI not displaced or sustained,S1 and S2 within normal limits, no S3, no S4, no clicks, no rubs, no murmurs ABD:  Flat, positive bowel sounds normal in frequency in pitch, no bruits, no rebound, no guarding, no midline pulsatile mass, no hepatomegaly, no splenomegaly EXT:  2 plus pulses throughout, no edema, no cyanosis no clubbing SKIN:  No rashes no nodules NEURO:  Cranial nerves II through XII grossly intact, motor grossly intact throughout PSYCH:  Cognitively intact, oriented to person place and time    EKG:  EKG is ordered today. The ekg ordered today demonstrates sinus rhythm, rate 60, axis within normal limits, intervals within normal limits, no acute ST-T wave changes.   Recent Labs: 11/08/2020: ALT 18; BUN 10; Creatinine, Ser 0.63; Hemoglobin 12.1; Magnesium 1.8; Platelets 316; Potassium 3.2; Sodium 139; TSH 1.581    Lipid Panel    Component Value Date/Time   CHOL 149 11/08/2020 0322   TRIG 88 11/08/2020 0322   HDL 36 (L) 11/08/2020 0322   CHOLHDL 4.1 11/08/2020 0322   VLDL 18 11/08/2020 0322   LDLCALC 95 11/08/2020 0322      Wt Readings from Last 3 Encounters:  03/26/21 296 lb (134.3 kg)  11/07/20 280 lb (127 kg)  10/22/20 294 lb (133.4 kg)      Other studies Reviewed: Additional studies/ records that were reviewed today include: Hospital records. Review of the above records demonstrates:  Please see elsewhere in the note.     ASSESSMENT AND PLAN:  HYPERTENSIVE URGENCY: I am getting check renal artery Dopplers.  Otherwise she has had labs and a urinalysis that do not suggest a secondary etiology.  I think her medical regimen minus the Cozaar which she has not been taking is excellent and her blood pressure seems well controlled.  She is going to keep an eye on her  blood pressures and share them with those.  We might even be able to back down as she loses weight.  I do not think she needs screening for sleep apnea at this point but rather weight management, salt restriction.  MORBID OBESITY: I will send her for an appointment with her weight management program.  We discussed diet and exercise at length and I am proud of her for her efforts and desire.   Current medicines are reviewed at length with the patient today.  The patient does not have concerns regarding medicines.  The following changes have been made:  no change  Labs/ tests ordered today include:  Orders Placed This Encounter  Procedures   Amb Ref to Medical Weight Management   EKG 12-Lead   VAS US RENAL ARTERY DUPLEX      Disposition:   FU with APP in 6 months   Signed, Minus Breeding, MD  03/26/2021 10:30 AM    [facility]

## 2021-03-26 ENCOUNTER — Encounter: Payer: Self-pay | Admitting: Cardiology

## 2021-03-26 ENCOUNTER — Ambulatory Visit (INDEPENDENT_AMBULATORY_CARE_PROVIDER_SITE_OTHER): Payer: Medicaid Other | Admitting: Cardiology

## 2021-03-26 ENCOUNTER — Other Ambulatory Visit: Payer: Self-pay

## 2021-03-26 VITALS — BP 112/82 | HR 68 | Ht 68.0 in | Wt 296.0 lb

## 2021-03-26 DIAGNOSIS — I160 Hypertensive urgency: Secondary | ICD-10-CM

## 2021-03-26 DIAGNOSIS — Z6841 Body Mass Index (BMI) 40.0 and over, adult: Secondary | ICD-10-CM

## 2021-03-26 NOTE — Patient Instructions (Addendum)
Medication Instructions:  No Changes In Medications at this time.  *If you need a refill on your cardiac medications before your next appointment, please call your pharmacy*  Testing/Procedures: Your physician has requested that you have a renal artery duplex. During this test, an ultrasound is used to evaluate blood flow to the kidneys. Take your medications as you usually do. This will take place at [place], [address].  No food after 11PM the night before.  Water is OK. (Don't drink liquids if you have been instructed not to for ANOTHER test). Take two Extra-Strength Gas-X capsules at bedtime the night before test.   Take an additional two Extra-Strength Gas-X capsules three (3) hours before the test or first thing in the morning.   Avoid foods that produce bowel gas, for 24 hours prior to exam (see below).   No breakfast, no chewing gum, no smoking or carbonated beverages. Patient may take morning medications with water. Come in for test at least 15 minutes early to register.  Follow-Up: At [facility], you and your health needs are our priority.  As part of our continuing mission to provide you with exceptional heart care, we have created designated Provider Care Teams.  These Care Teams include your primary Cardiologist (physician) and Advanced Practice Providers (APPs -  Physician Assistants and Nurse Practitioners) who all work together to provide you with the care you need, when you need it.  Your next appointment:   6 month(s)  The format for your next appointment:   In Person  Provider:   You may see Dr. Percival Spanish or one of the following Advanced Practice Providers on your designated Care Team:   Rosaria Ferries, PA-C Caron Presume, PA-C Jory Sims, DNP, ANP  Other Instructions REFERRAL TO HEALTHY WEIGHT AND WELLNESS- SOMEONE WILL REACH OUT TO YOU REGARDING THIS   PLEASE USE BP LOG PROVIDED TO MONITOR YOUR BLOOD PRESSURE

## 2021-04-03 ENCOUNTER — Ambulatory Visit (HOSPITAL_COMMUNITY)
Admission: RE | Admit: 2021-04-03 | Payer: Medicaid Other | Source: Ambulatory Visit | Attending: Cardiology | Admitting: Cardiology

## 2021-04-03 ENCOUNTER — Encounter (HOSPITAL_COMMUNITY): Payer: Self-pay

## 2021-04-30 ENCOUNTER — Encounter: Payer: Self-pay | Admitting: General Surgery

## 2021-05-25 DIAGNOSIS — M25332 Other instability, left wrist: Secondary | ICD-10-CM | POA: Insufficient documentation

## 2021-05-25 DIAGNOSIS — M79641 Pain in right hand: Secondary | ICD-10-CM | POA: Insufficient documentation

## 2021-09-16 ENCOUNTER — Other Ambulatory Visit: Payer: Self-pay

## 2021-09-16 ENCOUNTER — Emergency Department: Payer: Managed Care, Other (non HMO)

## 2021-09-16 ENCOUNTER — Emergency Department
Admission: EM | Admit: 2021-09-16 | Discharge: 2021-09-16 | Disposition: A | Discharge status: Home / Self Care | Payer: Managed Care, Other (non HMO) | Attending: Student in an Organized Health Care Education/Training Program | Admitting: Student in an Organized Health Care Education/Training Program

## 2021-09-16 DIAGNOSIS — M778 Other enthesopathies, not elsewhere classified: Secondary | ICD-10-CM

## 2021-09-16 DIAGNOSIS — I10 Essential (primary) hypertension: Secondary | ICD-10-CM | POA: Diagnosis not present

## 2021-09-16 DIAGNOSIS — J45909 Unspecified asthma, uncomplicated: Secondary | ICD-10-CM | POA: Insufficient documentation

## 2021-09-16 DIAGNOSIS — M79641 Pain in right hand: Secondary | ICD-10-CM | POA: Diagnosis present

## 2021-09-16 MED ORDER — PREDNISONE 10 MG PO TABS: ORAL_TABLET | ORAL | 0 refills | Status: DC

## 2021-09-16 NOTE — ED Triage Notes (Signed)
Pt presents to ER c/o right hand pain that started this morning.  Pt denies injuring hand.  States she has had this pain previously and thinks she had some tendinitis.  Pt states she has had trouble with grabbing things with her fingers and is experiencing some numbness.  Pt A&O x4 at this time in NAD.

## 2021-09-16 NOTE — Discharge Instructions (Addendum)
Call make an appointment with Dr. Roland Rack who is the orthopedist on-call if any continued problems or not improving.  Begin taking prednisone today with 6 tablets and tapering down by 1 tablet each day until you have completely finished.  You may use ice or heat to your hand and wrist as needed for discomfort.  Decrease the amount of use to your right thumb whenever possible.  You may also take Tylenol in addition to the prednisone if additional pain medication as needed.

## 2021-09-16 NOTE — ED Notes (Signed)
Pt c/o right hand pain around thumb and pointer finger. Does a lot of typing and recently hand has been under more strain due to left hand being broken.

## 2021-09-16 NOTE — ED Provider Notes (Signed)
[facility] Provider Note    Event Date/Time   First MD Initiated Contact with Patient 09/16/21 [phone]     (approximate)   History   Hand Pain   HPI  Terri Chambers is a 46 y.o. female presents to the ED with complaint of right hand pain.  Patient states that the pain started this morning and denies any injury.  She states that similarly approximately 1 month ago she had some discomfort with movement of her right thumb.  She decreased use of her right hand and things improved.  Patient has been reliant on her right hand more as her left hand and wrist is in a cast.  Pain is increased with extension of her right thumb.  As this pain just started this morning she has not taken over-the-counter medication for this.  Patient has a history of hypertension, migraines, asthma, myasthenia gravis, and depression.  She rates her pain as 4 out of 10.     Physical Exam   Triage Vital Signs: ED Triage Vitals  Enc Vitals Group     BP 09/16/21 0634 131/84     Pulse Rate 09/16/21 0634 81     Resp 09/16/21 0634 16     Temp 09/16/21 0634 99.3 F (37.4 C)     Temp Source 09/16/21 0634 Oral     SpO2 09/16/21 0634 96 %     Weight 09/16/21 0635 280 lb (127 kg)     Height 09/16/21 0635 5\' 8"  (1.727 m)     Head Circumference --      Peak Flow --      Pain Score 09/16/21 0634 4     Pain Loc --      Pain Edu? --      Excl. in [place]? --     Most recent vital signs: Vitals:   09/16/21 0634  BP: 131/84  Pulse: 81  Resp: 16  Temp: 99.3 F (37.4 C)  SpO2: 96%     General: Awake, no distress.  CV:  Good peripheral perfusion.  Resp:  Normal effort.  Abd:  No distention.  Other:  Examination of the right hand there is no gross deformity.  Patient is able to slowly flex and extend digits 2 through 5 without any difficulty.  With passive abduction of her left thumb patient complains of pain mostly at the lateral aspect of her thumb radiating down from her DIP joint into her  wrist.  No erythema, edema or warmth is noted in this area.  Skin is intact.  Capillary fill is less than 3 seconds.  Radial pulses present.   ED Results / Procedures / Treatments   Labs (all labs ordered are listed, but only abnormal results are displayed) Labs Reviewed - No data to display   RADIOLOGY Images of the right hand x-rays were reviewed by myself and radiology report was negative.    PROCEDURES:  Critical Care performed:   Procedures   MEDICATIONS ORDERED IN ED: Medications - No data to display   IMPRESSION / MDM / [place] / ED COURSE  I reviewed the triage vital signs and the nursing notes.   Differential diagnosis includes, but is not limited to, right hand pain, right thumb pain, right thumb sprain, tendinitis, tenosynovitis.   46 year old female presents to the ED with complaint of right hand pain history of injury.  Patient states that it now hurts to use her right thumb to lift things.  On  exam there is moderate tenderness along abduction of the thumb and along the tendon sheath.  No gross deformity, redness, warmth or skin discoloration is noted.  Capillary refill still less than 3 seconds.  X-ray of the right hand was reviewed and no bony abnormality was noted by myself or the radiologist.  I discussed this with patient and initially recommended that we put her in a thumb spica splint but she is currently wearing cast on the left hand.  After significant consideration she decided that she would like to try the thumb spica splint.  A prescription for a tapering dose of prednisone was sent to her pharmacy.  Patient is to follow-up with her PCP or Dr. Roland Rack who is on-call for orthopedics if any continued problems.  Past medical history as noted above.       FINAL CLINICAL IMPRESSION(S) / ED DIAGNOSES   Final diagnoses:  Thumb tendonitis     Rx / DC Orders   ED Discharge Orders          Ordered    predniSONE (DELTASONE) 10 MG tablet         09/16/21 0841             Note:  This document was prepared using Dragon voice recognition software and may include unintentional dictation errors.   Johnn Hai, PA-C 09/16/21 1443    Merlyn Lot, MD 09/16/21 1452

## 2021-09-26 NOTE — Progress Notes (Unsigned)
Cardiology Office Note   Date:  09/26/2021   ID:  Terri Chambers, DOB 1976-04-21, MRN [id number]  PCP:  System, Provider Not In  Cardiologist:   None Referring Donnie Coffin, MD  No chief complaint on file.      History of Present Illness: Terri Chambers is a 46 y.o. female who presents for evaluation of hypertensive urgency.  She was in the hospital in April 2022 for this..  She had neurologic symptoms to include some numbness and tingling.  She had hypertensive urgency.  She had a work-up to include MRI of the brain that was normal.  CT head and neck was unremarkable.  Carotid ultrasounds were unremarkable.  Transthoracic echo demonstrated no abnormalities including a negative bubble study.    After the last visit she was to get a renal Doppler.  ***    *** reviewed these records for this visit.  She thinks that she was having a reaction to her clonidine patch and she shows me a severe rash that she had on her arms.  She was sent home with atenolol, Cozaar, clonidine patch and amlodipine was added.  She is not taking the Cozaar currently.  She persisted with the clonidine patch and her rash went away.  Her blood pressure seem to be better controlled now.  She is really working on weight loss with diet and exercise.  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  She is now more active and trying to watch diet and increase activity.  She would like some help with this.  She denies any chest pressure, neck or arm discomfort.  There is no new palpitations, presyncope or syncope  Past Medical History:  Diagnosis Date   Anemia    WITH PREGNANCY   Depression    History of asthma    History of plasmapheresis    Hypertension    Migraine headache    Morbid obesity ([place])    Myasthenia gravis ([place]) 2005    Past Surgical History:  Procedure Laterality Date   EAR TUBE REMOVAL     MYOMECTOMY N/A  08/21/2018   Procedure: ABDOMINAL MYOMECTOMY;  Surgeon: Rubie Maid, MD;  Location: [facility] ORS;  Service: Gynecology;  Laterality: N/A;   TYMPANOSTOMY TUBE PLACEMENT     UMBILICAL HERNIA REPAIR N/A 08/21/2018   Procedure: HERNIA REPAIR UMBILICAL ADULT;  Surgeon: Fredirick Maudlin, MD;  Location: [facility] ORS;  Service: General;  Laterality: N/A;     Current Outpatient Medications  Medication Sig Dispense Refill   amLODipine (NORVASC) 10 MG tablet Take 1 tablet (10 mg total) by mouth daily. 90 tablet 1   atenolol (TENORMIN) 100 MG tablet Take 100 mg by mouth every morning.      cloNIDine (CATAPRES - DOSED IN MG/24 HR) 0.3 mg/24hr patch Apply 2 patch to skin once a week     OZEMPIC, 0.25 OR 0.5 MG/DOSE, 2 MG/1.5ML SOPN Inject 0.25 mg into the skin once a week.     predniSONE (DELTASONE) 10 MG tablet Take 6 tablets  today, on day 2 take 5 tablets, day 3 take 4 tablets, day 4 take 3 tablets, day 5 take  2 tablets and 1 tablet the last day 21 tablet 0   No current facility-administered medications for this visit.    Allergies:   Patient has no known allergies.    ROS:  Please  see the history of present illness.   Otherwise, review of systems are positive for ***.   All other systems are reviewed and negative.    PHYSICAL EXAM: VS:  There were no vitals taken for this visit. , BMI There is no height or weight on file to calculate BMI. GENERAL:  Well appearing NECK:  No jugular venous distention, waveform within normal limits, carotid upstroke brisk and symmetric, no bruits, no thyromegaly LUNGS:  Clear to auscultation bilaterally CHEST:  Unremarkable HEART:  PMI not displaced or sustained,S1 and S2 within normal limits, no S3, no S4, no clicks, no rubs, *** murmurs ABD:  Flat, positive bowel sounds normal in frequency in pitch, no bruits, no rebound, no guarding, no midline pulsatile mass, no hepatomegaly, no splenomegaly EXT:  2 plus pulses throughout, no edema, no cyanosis no  clubbing    ***GENERAL:  Well appearing HEENT:  Pupils equal round and reactive, fundi not visualized, oral mucosa unremarkable NECK:  No jugular venous distention, waveform within normal limits, carotid upstroke brisk and symmetric, no bruits, no thyromegaly LYMPHATICS:  No cervical, inguinal adenopathy LUNGS:  Clear to auscultation bilaterally BACK:  No CVA tenderness CHEST:  Unremarkable HEART:  PMI not displaced or sustained,S1 and S2 within normal limits, no S3, no S4, no clicks, no rubs, no murmurs ABD:  Flat, positive bowel sounds normal in frequency in pitch, no bruits, no rebound, no guarding, no midline pulsatile mass, no hepatomegaly, no splenomegaly EXT:  2 plus pulses throughout, no edema, no cyanosis no clubbing SKIN:  No rashes no nodules NEURO:  Cranial nerves II through XII grossly intact, motor grossly intact throughout PSYCH:  Cognitively intact, oriented to person place and time    EKG:  EKG is *** ordered today. The ekg ordered today demonstrates sinus rhythm, rate ***, axis within normal limits, intervals within normal limits, no acute ST-T wave changes.   Recent Labs: 11/08/2020: ALT 18; BUN 10; Creatinine, Ser 0.63; Hemoglobin 12.1; Magnesium 1.8; Platelets 316; Potassium 3.2; Sodium 139; TSH 1.581    Lipid Panel    Component Value Date/Time   CHOL 149 11/08/2020 0322   TRIG 88 11/08/2020 0322   HDL 36 (L) 11/08/2020 0322   CHOLHDL 4.1 11/08/2020 0322   VLDL 18 11/08/2020 0322   LDLCALC 95 11/08/2020 0322      Wt Readings from Last 3 Encounters:  09/16/21 280 lb (127 kg)  03/26/21 296 lb (134.3 kg)  11/07/20 280 lb (127 kg)      Other studies Reviewed: Additional studies/ records that were reviewed today include: *** Review of the above records demonstrates:  Please see elsewhere in the note.     ASSESSMENT AND PLAN:  HYPERTENSIVE URGENCY:     She was to have a renal Doppler.   ***  I am getting check renal artery Dopplers.  Otherwise she  has had labs and a urinalysis that do not suggest a secondary etiology.  I think her medical regimen minus the Cozaar which she has not been taking is excellent and her blood pressure seems well controlled.  She is going to keep an eye on her blood pressures and share them with those.  We might even be able to back down as she loses weight.  I do not think she needs screening for sleep apnea at this point but rather weight management, salt restriction.  MORBID OBESITY:   *** I will send her for an appointment with her weight management program.  We discussed diet  and exercise at length and I am proud of her for her efforts and desire.   Current medicines are reviewed at length with the patient today.  The patient does not have concerns regarding medicines.  The following changes have been made:  no change  Labs/ tests ordered today include:   No orders of the defined types were placed in this encounter.     Disposition:   FU with ***    Signed, Minus Breeding, MD  09/26/2021 12:19 PM    [facility]

## 2021-09-28 ENCOUNTER — Ambulatory Visit: Payer: Medicaid Other | Admitting: Cardiology

## 2021-09-28 DIAGNOSIS — I160 Hypertensive urgency: Secondary | ICD-10-CM

## 2021-11-08 IMAGING — MR MR HEAD W/O CM
10 series · 43 of 48 positions shown · non-contrast
Comparison: CT and CTA from earlier today. There is a brain MRI
from 5270 but only sagittal T1 images are available.

CLINICAL DATA: Acute stroke suspected

EXAM:
MRI HEAD WITHOUT CONTRAST
TECHNIQUE: Multiplanar, multiecho pulse sequences of the brain and surrounding
structures were obtained without intravenous contrast.

[Series 5: dwi_tracew · axial · 3.0mm · 1.08mm/px · z∈[-77,+71]mm · 8 of 102 slices shown]
[im 1/102]
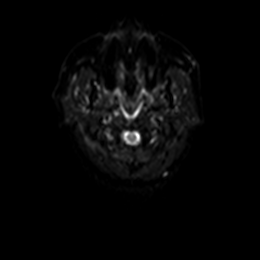
[im 21/102]
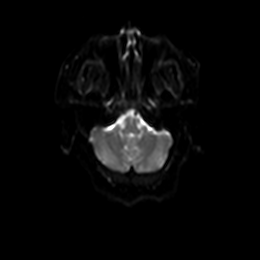
[im 31/102]
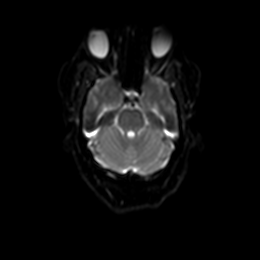
[im 41/102]
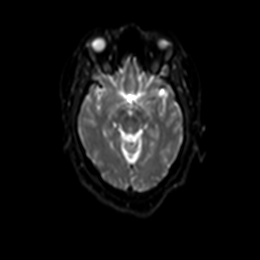
[im 61/102]
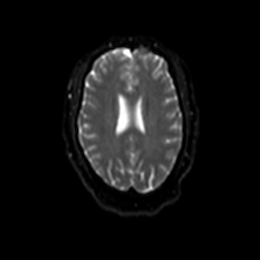
[im 71/102]
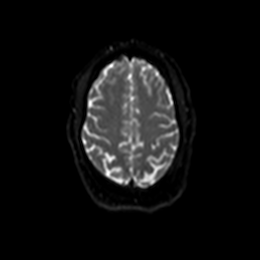
[im 81/102]
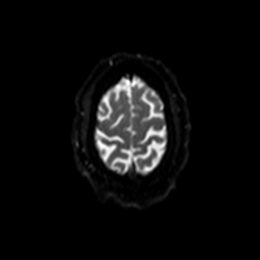
[im 102/102]
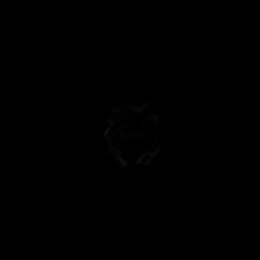

[Series 6: dwi_adc · axial · 3.0mm · 1.08mm/px · z∈[-77,-3]mm · 3 of 51 slices shown]
[im 1/51]
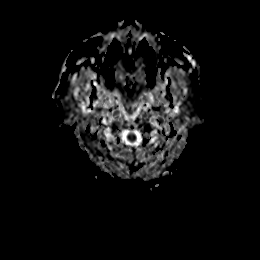
[im 13/51]
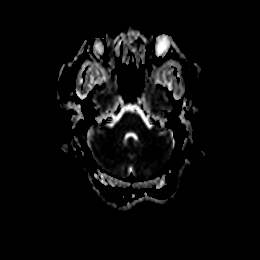
[im 26/51]
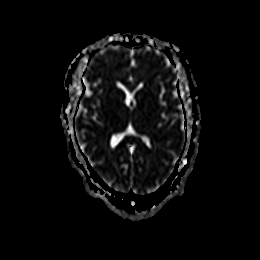

[Series 7: T2 · sagittal · 5.0mm · 0.47mm/px · 2 of 25 slices shown (1 of 3)]
[im 1/25]
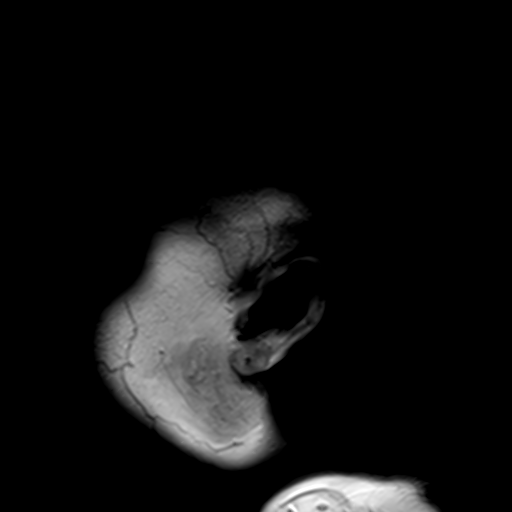
[im 25/25]
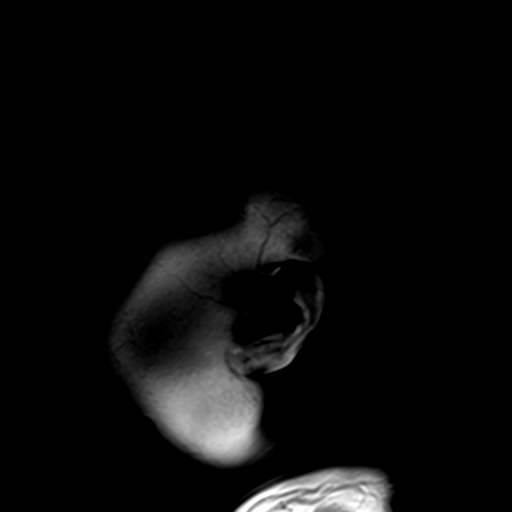

[Series 8: T2 · axial · 5.0mm · 0.45mm/px · z∈[-88,+72]mm · 3 of 26 slices shown (2 of 3)]
[im 1/26]
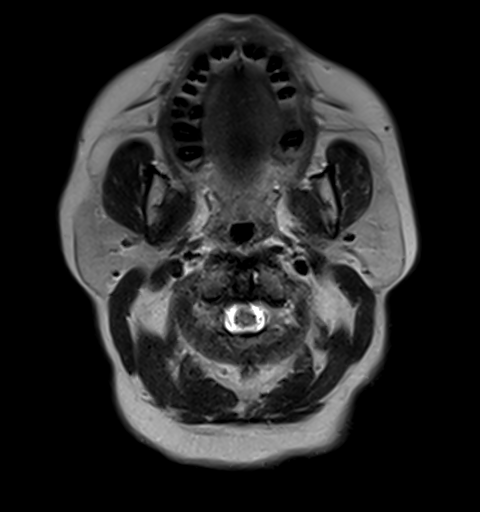
[im 13/26]
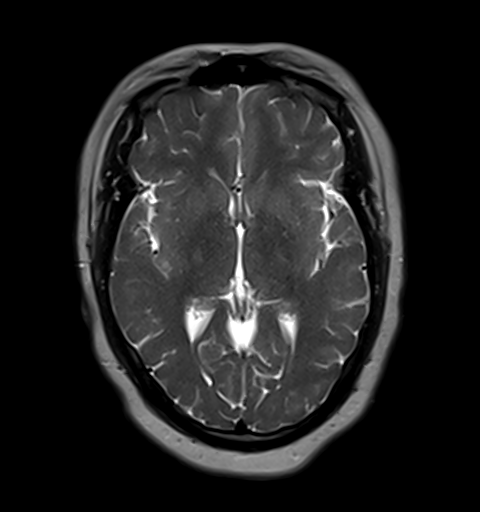
[im 26/26]
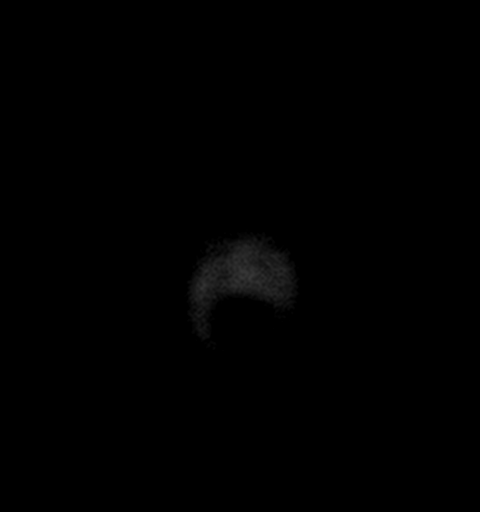

[Series 9: GRE · axial · 3.0mm · 0.45mm/px · z∈[-81,+67]mm · 5 of 51 slices shown]
[im 1/51]
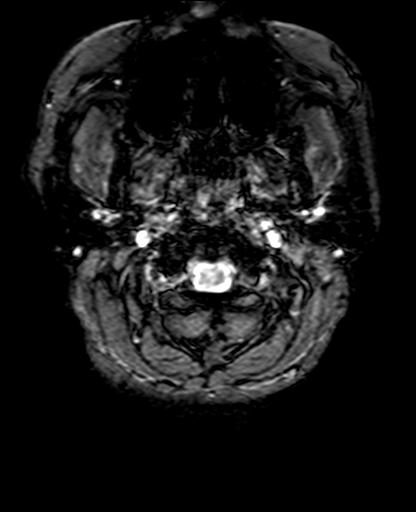
[im 13/51]
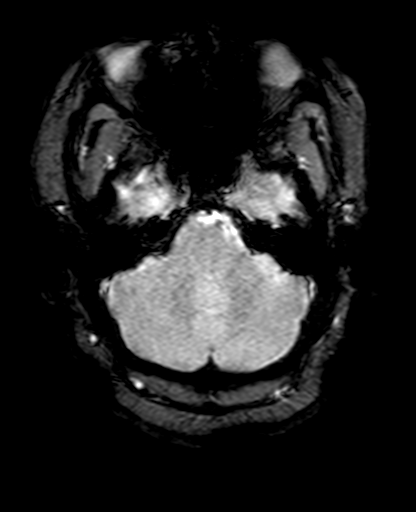
[im 26/51]
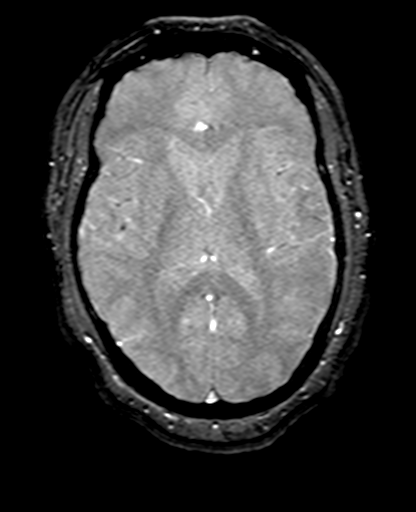
[im 38/51]
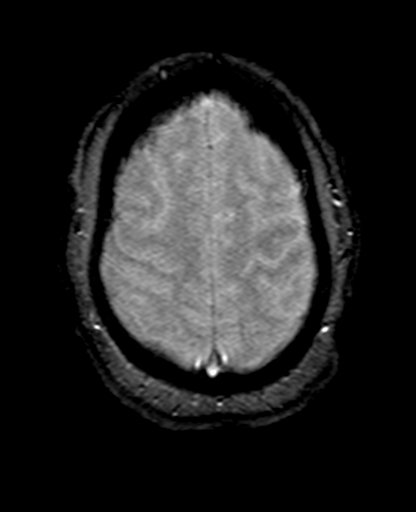
[im 51/51]
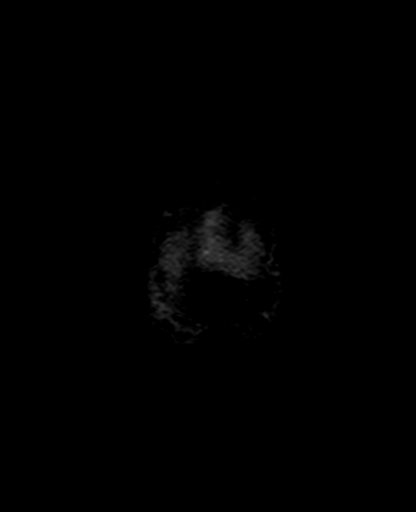

[Series 10: FLAIR · axial · 3.0mm · 0.86mm/px · z∈[-84,+66]mm · 5 of 52 slices shown]
[im 1/52]
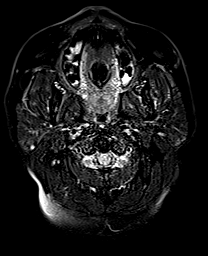
[im 13/52]
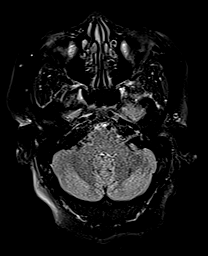
[im 26/52]
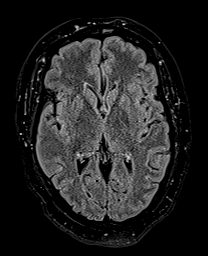
[im 39/52]
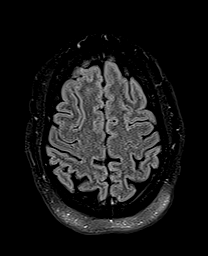
[im 52/52]
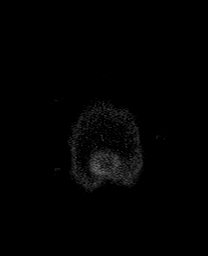

[Series 11: T1 · axial · 3.0mm · 0.45mm/px · z∈[-81,+67]mm · 5 of 51 slices shown]
[im 1/51]
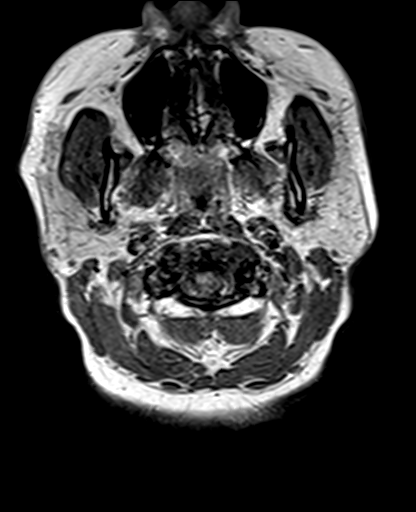
[im 13/51]
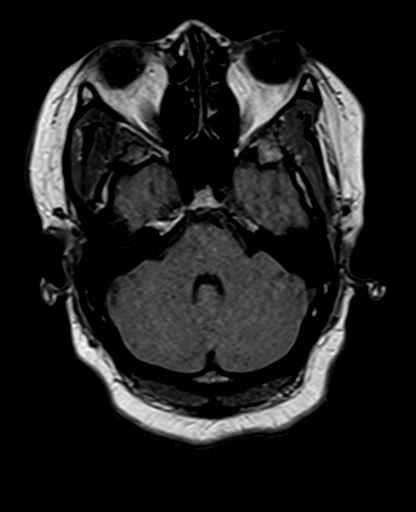
[im 26/51]
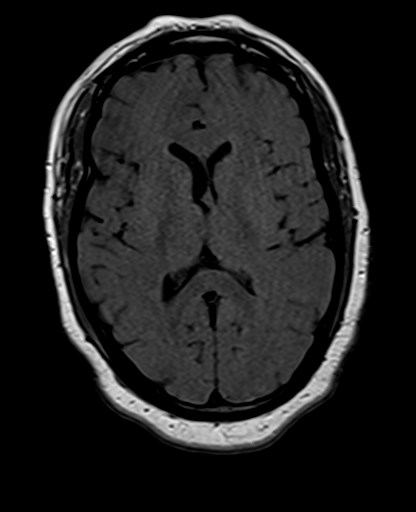
[im 38/51]
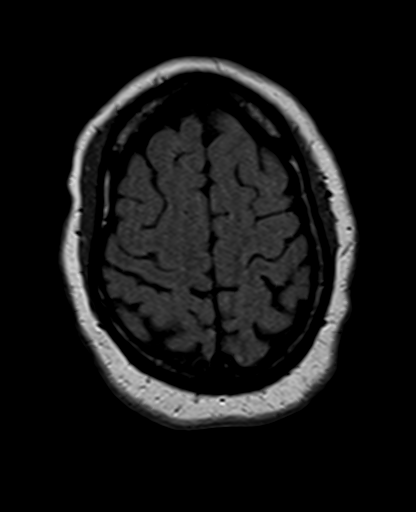
[im 51/51]
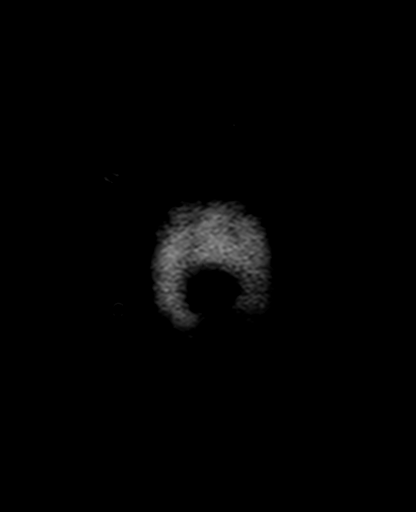

[Series 12: DWI · coronal · 5.0mm · 1.31mm/px · 6 of 64 slices shown (1 of 2)]
[im 1/64]
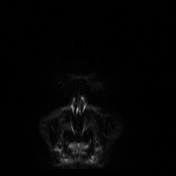
[im 13/64]
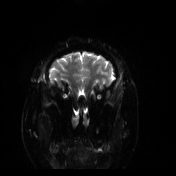
[im 26/64]
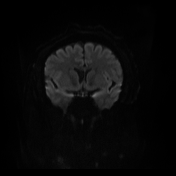
[im 38/64]
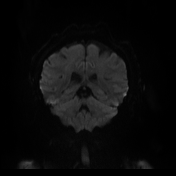
[im 51/64]
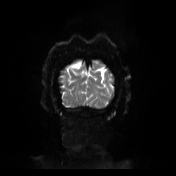
[im 64/64]
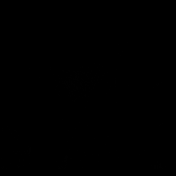

[Series 13: DWI · coronal · 5.0mm · 1.31mm/px · 3 of 31 slices shown (2 of 2)]
[im 1/31]
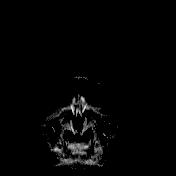
[im 16/31]
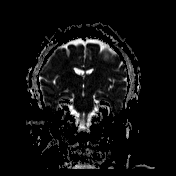
[im 31/31]
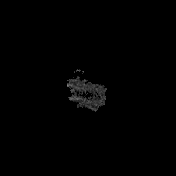

[Series 14: T2 · coronal · 5.0mm · 0.86mm/px · 3 of 30 slices shown (3 of 3)]
[im 1/30]
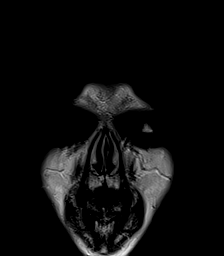
[im 15/30]
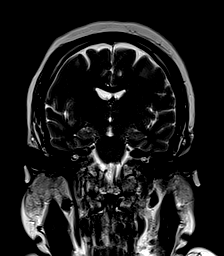
[im 30/30]
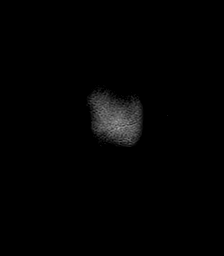

[43 of 48 positions shown; findings below may reference images not displayed]

FINDINGS: Brain: No acute infarction, hemorrhage, hydrocephalus, extra-axial
collection or mass lesion.

Vascular: Normal flow voids.

Skull and upper cervical spine: Normal marrow signal.

Sinuses/Orbits: Negative.

Other: Intermittent motion artifact.
IMPRESSION: Normal MRI of the brain.

## 2022-01-13 DIAGNOSIS — M0609 Rheumatoid arthritis without rheumatoid factor, multiple sites: Secondary | ICD-10-CM | POA: Insufficient documentation

## 2022-01-13 DIAGNOSIS — Z7960 Long term (current) use of unspecified immunomodulators and immunosuppressants: Secondary | ICD-10-CM | POA: Insufficient documentation

## 2022-09-13 ENCOUNTER — Ambulatory Visit (INDEPENDENT_AMBULATORY_CARE_PROVIDER_SITE_OTHER): Payer: Managed Care, Other (non HMO)

## 2022-09-13 ENCOUNTER — Ambulatory Visit (INDEPENDENT_AMBULATORY_CARE_PROVIDER_SITE_OTHER): Payer: Managed Care, Other (non HMO) | Admitting: Podiatry

## 2022-09-13 ENCOUNTER — Encounter: Payer: Self-pay | Admitting: Podiatry

## 2022-09-13 VITALS — BP 166/96 | HR 77

## 2022-09-13 DIAGNOSIS — G5791 Unspecified mononeuropathy of right lower limb: Secondary | ICD-10-CM | POA: Diagnosis not present

## 2022-09-13 DIAGNOSIS — L600 Ingrowing nail: Secondary | ICD-10-CM

## 2022-09-13 DIAGNOSIS — M7750 Other enthesopathy of unspecified foot: Secondary | ICD-10-CM

## 2022-09-13 DIAGNOSIS — M778 Other enthesopathies, not elsewhere classified: Secondary | ICD-10-CM | POA: Diagnosis not present

## 2022-09-13 DIAGNOSIS — M7751 Other enthesopathy of right foot: Secondary | ICD-10-CM

## 2022-09-13 DIAGNOSIS — L603 Nail dystrophy: Secondary | ICD-10-CM | POA: Diagnosis not present

## 2022-09-13 MED ORDER — NEOMYCIN-POLYMYXIN-HC 1 % OT SOLN: OTIC | 1 refills | Status: AC

## 2022-09-13 NOTE — Progress Notes (Signed)
Subjective:  Patient ID: Terri Chambers, female    DOB: Aug 17, 1975,  MRN: [id number] HPI Chief Complaint  Patient presents with   Toe Pain    Hallux right - lateral border, ingrown x months, tried trimming, toenail is discolored, been using tea tree oil periodically   Foot Pain    Lateral foot right - 5th met base - itchy sometimes, dull feeling, concerned for neuropathy, not painful  1st MPJ left - limited ROM, notice when walking can't bend the toe well, concerned for arthritis    New Patient (Initial Visit)    47 y.o. female presents with the above complaint.   ROS: Denies fever chills nausea vomit muscle aches pains calf pain back pain chest pain shortness of breath.  Past Medical History:  Diagnosis Date   Anemia    WITH PREGNANCY   Depression    History of asthma    History of plasmapheresis    Hypertension    Migraine headache    Morbid obesity ([place])    Myasthenia gravis ([place]) 2005   Past Surgical History:  Procedure Laterality Date   EAR TUBE REMOVAL     MYOMECTOMY N/A 08/21/2018   Procedure: ABDOMINAL MYOMECTOMY;  Surgeon: Rubie Maid, MD;  Location: [facility] ORS;  Service: Gynecology;  Laterality: N/A;   TYMPANOSTOMY TUBE PLACEMENT     UMBILICAL HERNIA REPAIR N/A 08/21/2018   Procedure: HERNIA REPAIR UMBILICAL ADULT;  Surgeon: Fredirick Maudlin, MD;  Location: [facility] OR[facility];  Service: General;  Laterality: N/A;    Current Outpatient Medications:    celecoxib (CELEBREX) 100 MG capsule, Take 100 mg by mouth 2 (two) times daily., Disp: , Rfl:    NEOMYCIN-POLYMYXIN-HYDROCORTISONE (CORTISPORIN) 1 % SOLN OTIC solution, Apply 1-2 drops to toe BID after soaking, Disp: 10 mL, Rfl: 1   Vitamin D, Ergocalciferol, (DRISDOL) 1.25 MG (50000 UNIT) CAPS capsule, Take 50,000 Units by mouth once a week., Disp: , Rfl:    amLODipine (NORVASC) 10 MG tablet, Take 1 tablet (10 mg total) by mouth daily., Disp: 90 tablet, Rfl: 1   atenolol (TENORMIN) 100 MG tablet, Take 100 mg by mouth every  morning. , Disp: , Rfl:    cloNIDine (CATAPRES - DOSED IN MG/24 HR) 0.3 mg/24hr patch, Apply 2 patch to skin once a week, Disp: , Rfl:    folic acid (FOLVITE) 1 MG tablet, Take 1 mg by mouth daily., Disp: , Rfl:    hydroxychloroquine (PLAQUENIL) 200 MG tablet, Take 200 mg by mouth 2 (two) times daily., Disp: , Rfl:    losartan (COZAAR) 100 MG tablet, Take 100 mg by mouth daily., Disp: , Rfl:    methotrexate (RHEUMATREX) 2.5 MG tablet, Take 20 mg by mouth once a week., Disp: , Rfl:    OZEMPIC, 0.25 OR 0.5 MG/DOSE, 2 MG/1.5ML SOPN, Inject 0.25 mg into the skin once a week., Disp: , Rfl:    predniSONE (DELTASONE) 10 MG tablet, Take 6 tablets  today, on day 2 take 5 tablets, day 3 take 4 tablets, day 4 take 3 tablets, day 5 take  2 tablets and 1 tablet the last day, Disp: 21 tablet, Rfl: 0  Allergies  Allergen Reactions   Amlodipine Other (See Comments)   Review of Systems Objective:   Vitals:   09/13/22 0910  BP: (!) 166/96  Pulse: 77    General: Well developed, nourished, in no acute distress, alert and oriented x3   Dermatological: Skin is warm, dry and supple bilateral. Nails x 10 are well maintained;  remaining integument appears unremarkable at this time. There are no open sores, no preulcerative lesions, no rash or signs of infection present.  Sharply abraded nail margin along the fibular border of the hallux right.  Hallux right does demonstrate some distal onycholysis with discoloration as well.  There is no purulence no malodor no cellulitic process.  Chest tenderness on palpation.  Vascular: Dorsalis Pedis artery and Posterior Tibial artery pedal pulses are 2/4 bilateral with immedate capillary fill time. Pedal hair growth present. No varicosities and no lower extremity edema present bilateral.   Neruologic: Grossly intact via light touch bilateral. Vibratory intact via tuning fork bilateral. Protective threshold with Semmes Wienstein monofilament intact to all pedal sites  bilateral. Patellar and Achilles deep tendon reflexes 2+ bilateral. No Babinski or clonus noted bilateral.  She has an area of like a localized neuritis overlying the fifth metatarsal base of the right foot.  She states that it itches but mostly is just numb and on the outer edges of the area it appears to be itchy does not happen all the time.  There is no erythema cellulitis drainage or odor associated with this.  No rashes.  Musculoskeletal: No gross boney pedal deformities bilateral. No pain, crepitus, or limitation noted with foot and ankle range of motion bilateral. Muscular strength 5/5 in all groups tested bilateral.  Pain on end range of motion of the first metatarsophalangeal joint left foot.  Is limited in his range of motion but there is no palpable dorsal spurring noted.  This appears to be a functional hallux limitus  Gait: Unassisted, Nonantalgic.    Radiographs:  Radiographs taken today of the right foot do not demonstrate any type of osseous abnormalities along the fifth metatarsal area.  Though she does have elongated first metatarsals bilateral with a cystic lesion in the head of the first metatarsal left there appears to be subchondral and superior.  Assessment & Plan:   Assessment: Ingrown toenail fibular border hallux right.  Neuritis fifth metatarsal base right foot.  Nail dystrophy hallux right.  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  She tolerated the procedure well.  Was given both oral and written home-going instruction for the care and soaking of the toe as well as a prescription for Cortisporin Otic.  Samples of the skin and nail were taken today to be sent for pathologic evaluation for the nail dystrophy.  We will consider injecting the fifth metatarsal base laterally overlying the  area of neuritis/neuropathy  Will consider injecting the first metatarsophalangeal joint of the left foot.       Elnita Surprenant T. [place], [place]

## 2022-09-13 NOTE — Patient Instructions (Signed)

## 2022-12-07 ENCOUNTER — Emergency Department
Admission: EM | Admit: 2022-12-07 | Discharge: 2022-12-08 | Disposition: A | Discharge status: Home / Self Care | Payer: Managed Care, Other (non HMO) | Attending: Emergency Medicine | Admitting: Emergency Medicine

## 2022-12-07 ENCOUNTER — Emergency Department: Payer: Managed Care, Other (non HMO)

## 2022-12-07 ENCOUNTER — Other Ambulatory Visit: Payer: Self-pay

## 2022-12-07 DIAGNOSIS — I10 Essential (primary) hypertension: Secondary | ICD-10-CM | POA: Insufficient documentation

## 2022-12-07 DIAGNOSIS — R0602 Shortness of breath: Secondary | ICD-10-CM | POA: Diagnosis present

## 2022-12-07 DIAGNOSIS — Z79899 Other long term (current) drug therapy: Secondary | ICD-10-CM | POA: Diagnosis not present

## 2022-12-07 DIAGNOSIS — J4521 Mild intermittent asthma with (acute) exacerbation: Secondary | ICD-10-CM | POA: Insufficient documentation

## 2022-12-07 DIAGNOSIS — Z20822 Contact with and (suspected) exposure to covid-19: Secondary | ICD-10-CM | POA: Insufficient documentation

## 2022-12-07 DIAGNOSIS — J45901 Unspecified asthma with (acute) exacerbation: Secondary | ICD-10-CM

## 2022-12-07 LAB — COMPREHENSIVE METABOLIC PANEL
ALT: 16 U/L (ref 0–44)
AST: 21 U/L (ref 15–41)
Albumin: 3.9 g/dL (ref 3.5–5.0)
Alkaline Phosphatase: 67 U/L (ref 38–126)
Anion gap: 7 (ref 5–15)
BUN: 21 mg/dL — ABNORMAL HIGH (ref 6–20)
CO2: 28 mmol/L (ref 22–32)
Calcium: 9.1 mg/dL (ref 8.9–10.3)
Chloride: 107 mmol/L (ref 98–111)
Creatinine, Ser: 0.84 mg/dL (ref 0.44–1.00)
GFR, Estimated: >60 mL/min (ref >60)
Glucose, Bld: 110 mg/dL — ABNORMAL HIGH (ref 70–99)
Potassium: 3.3 mmol/L — ABNORMAL LOW (ref 3.5–5.1)
Sodium: 142 mmol/L (ref 135–145)
Total Bilirubin: 0.6 mg/dL (ref 0.3–1.2)
Total Protein: 7.2 g/dL (ref 6.5–8.1)

## 2022-12-07 LAB — CBC WITH DIFFERENTIAL/PLATELET
Abs Immature Granulocytes: 0.03 10*3/uL (ref 0.00–0.07)
Basophils Absolute: 0 10*3/uL (ref 0.0–0.1)
Basophils Relative: 0 %
Eosinophils Absolute: 0.1 10*3/uL (ref 0.0–0.5)
Eosinophils Relative: 2 %
HCT: 37.9 % (ref 36.0–46.0)
Hemoglobin: 12.7 g/dL (ref 12.0–15.0)
Immature Granulocytes: 1 %
Lymphocytes Relative: 37 %
Lymphs Abs: 2.2 10*3/uL (ref 0.7–4.0)
MCH: 30 pg (ref 26.0–34.0)
MCHC: 33.5 g/dL (ref 30.0–36.0)
MCV: 89.4 fL (ref 80.0–100.0)
Monocytes Absolute: 0.6 10*3/uL (ref 0.1–1.0)
Monocytes Relative: 10 %
Neutro Abs: 3 10*3/uL (ref 1.7–7.7)
Neutrophils Relative %: 50 %
Platelets: 320 10*3/uL (ref 150–400)
RBC: 4.24 MIL/uL (ref 3.87–5.11)
RDW: 13.6 % (ref 11.5–15.5)
WBC: 6 10*3/uL (ref 4.0–10.5)
nRBC: 0 % (ref 0.0–0.2)

## 2022-12-07 LAB — TROPONIN I (HIGH SENSITIVITY): Troponin I (High Sensitivity): <2 ng/L (ref <18)

## 2022-12-07 LAB — SARS CORONAVIRUS 2 BY RT PCR: SARS Coronavirus 2 by RT PCR: NEGATIVE

## 2022-12-07 MED ORDER — POTASSIUM CHLORIDE CRYS ER 20 MEQ PO TBCR
40.0000 meq | EXTENDED_RELEASE_TABLET | Freq: Once | ORAL | Status: AC
  Administered 2022-12-08: 40 meq via ORAL
  Filled 2022-12-07: qty 2

## 2022-12-07 MED ORDER — IPRATROPIUM-ALBUTEROL 0.5-2.5 (3) MG/3ML IN SOLN
3.0000 mL | Freq: Once | RESPIRATORY_TRACT | Status: AC
  Administered 2022-12-08: 3 mL via RESPIRATORY_TRACT
  Filled 2022-12-07: qty 3

## 2022-12-07 NOTE — ED Triage Notes (Signed)
Pt presents to ER with c/o sob x3 weeks.  Pt states she came in today because she "thought something's wrong."  Pt states she has had a cough, chest tightness, HA, and upper back pain.  Pt states cough is dry in nature.  Pt states somebody at her [employer] had covid, but she was not in close proximity to him.  Pt is otherwise A&O x4 and in NAD in triage.

## 2022-12-07 NOTE — ED Provider Notes (Signed)
[facility] Provider Note    Event Date/Time   First MD Initiated Contact with Patient 12/07/22 2339     (approximate)   History   Shortness of Breath   HPI  Terri Chambers is a 46 y.o. female the ED from home who presents to the home with a chief pain of shortness of breath x 3 weeks.  Describes waxing/waning symptoms of dry cough, chest tightness, headache and upper back pain.  Reports coughing spasms with wheezing.  Had remote exposure to coworker with COVID.  Denies dyspnea on exertion.  Denies fever/chills, abdominal pain, nausea, vomiting or dizziness.  Denies recent travel, trauma or hormone use.  Presents tonight because she is tired of feeling this way and desired evaluation.     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 05/25/2021   Right sided numbness 11/07/2020   Hypertensive urgency 11/07/2020   Hypokalemia 11/07/2020   S/P myomectomy 08/21/2018   Essential hypertension 11/05/2017   Class 3 severe obesity due to excess calories with serious comorbidity and body mass index (BMI) of 40.0 to 44.9 in adult (HCC) 11/05/2017   Umbilical hernia without obstruction and without gangrene 11/05/2017   Menorrhagia with regular cycle 11/05/2017   Right ovarian cyst 11/05/2017   Uterine leiomyoma 11/05/2017   Abnormal electrocardiogram  12/23/2016   Blurred vision 05/05/2015   Dysphagia 05/05/2015   Myasthenia gravis without exacerbation ([facility]) 10/02/2012   Myasthenia gravis with exacerbation ([facility]CC) 10/02/2012     Past Surgical History   Past Surgical History:  Procedure Laterality Date   EAR TUBE REMOVAL     MYOMECTOMY N/A 08/21/2018   Procedure: ABDOMINAL MYOMECTOMY;  Surgeon: Hildred Laser, MD;  Location: [facility] ORS;  Service: Gynecology;  Laterality: N/A;   TYMPANOSTOMY TUBE PLACEMENT     UMBILICAL HERNIA REPAIR N/A 08/21/2018   Procedure: HERNIA REPAIR UMBILICAL ADULT;  Surgeon: Duanne Guess, MD;  Location: [facility] ORS;  Service: General;  Laterality: N/A;     Home Medications   Prior to Admission medications   Medication Sig Start Date End Date Taking? Authorizing Provider  albuterol (VENTOLIN HFA) 108 (90 Base) MCG/ACT inhaler Inhale 2 puffs into the lungs every 4 (four) hours as needed for wheezing or shortness of breath. 12/08/22   Irean Hong, MD  amLODipine (NORVASC) 10 MG tablet Take 1 tablet (10 mg total) by mouth daily. 11/08/20 05/07/21  Almon Hercules, MD  atenolol (TENORMIN) 100 MG tablet Take 100 mg by mouth every morning.     [provider]  celecoxib (CELEBREX) 100 MG capsule Take 100 mg by mouth 2 (two) times daily. 08/23/22   [provider]  cloNIDine (CATAPRES - DOSED IN MG/24 HR) 0.3 mg/24hr patch Apply 2 patch to skin once a week 11/04/20   [provider]  folic acid (FOLVITE) 1 MG tablet Take 1 mg by mouth daily.    [provider]  hydroxychloroquine (PLAQUENIL) 200 MG tablet Take 200 mg by mouth 2 (two) times daily.    [provider]  losartan (COZAAR) 100 MG tablet Take 100 mg by mouth daily.    [provider]  methotrexate (RHEUMATREX) 2.5 MG tablet Take 20 mg by mouth once a week.    [provider]  NEOMYCIN-POLYMYXIN-HYDROCORTISONE (CORTISPORIN) 1 % SOLN OTIC solution Apply 1-2 drops to toe BID after soaking 09/13/22   Hyatt, Max  T, DPM  OZEMPIC, 0.25 OR 0.5 MG/DOSE, 2 MG/1.5ML SOPN Inject 0.25 mg into the skin once a week. 10/08/20   [provider]  Vitamin D, Ergocalciferol, (DRISDOL) 1.25 MG (50000 UNIT) CAPS capsule Take 50,000 Units by mouth once a week. 08/19/22   [provider]     Allergies  Amlodipine   Family History   Family History  Problem Relation Age of Onset   Diabetes Mother    Arthritis Father    Hypertension Father    Cerebral aneurysm Maternal Grandmother    Congestive Heart Failure Paternal Grandmother    Cervical cancer Other        Maternal great-grandmother died of cervical cancer   Other Neg Hx      Physical Exam  Triage Vital Signs: ED Triage Vitals  Enc Vitals Group     BP 12/07/22 1949 133/89     Pulse Rate 12/07/22 1949 78     Resp 12/07/22 1949 18     Temp 12/07/22 1949 98.7 F (37.1 C)     Temp Source 12/07/22 1949 Oral     SpO2 12/07/22 1949 97 %     Weight 12/07/22 1951 290 lb (131.5 kg)     Height 12/07/22 1951 5\' 8"  (1.727 m)     Head Circumference --      Peak Flow --      Pain Score 12/07/22 1951 2     Pain Loc --      Pain Edu? --      Excl. in GC? --     Updated Vital Signs: BP (!) 138/99   Pulse 81   Temp 98.7 F (37.1 C) (Oral)   Resp 18   Ht 5\' 8"  (1.727 m)   Wt 131.5 kg   LMP  (LMP Unknown)   SpO2 92%   BMI 44.09 kg/m    General: Awake, no distress.  CV:  RRR.  Good peripheral perfusion.  Resp:  Normal effort.  Diminished, otherwise CTAB. Abd:  Obese, nontender.  No distention.  Other:  Bilateral calves are supple and nontender.  No pedal edema.   ED Results / Procedures / Treatments  Labs (all labs ordered are listed, but only abnormal results are displayed) Labs Reviewed  COMPREHENSIVE METABOLIC PANEL - Abnormal; Notable for the following components:      Result Value   Potassium 3.3 (*)    Glucose, Bld 110 (*)    BUN 21 (*)    All other components within normal limits  SARS CORONAVIRUS 2 BY RT PCR  CBC  WITH DIFFERENTIAL/PLATELET  BRAIN NATRIURETIC PEPTIDE  D-DIMER, QUANTITATIVE  TROPONIN I (HIGH SENSITIVITY)  TROPONIN I (HIGH SENSITIVITY)     EKG  ED ECG REPORT I, Tanessa Tidd J, the attending physician, personally viewed and interpreted this ECG.   Date: 12/08/2022  EKG Time: 1947  Rate: 77  Rhythm: normal sinus rhythm  Axis: Normal  Intervals:none  ST&T Change: Nonspecific    RADIOLOGY I have independently visualized and interpreted patient's x-ray as well as noted the radiology interpretation:  X-ray: Minimal right basilar subsegmental atelectasis  Official radiology report(s): DG Chest 1 View  Result Date: 12/07/2022 CLINICAL DATA:  Shortness of breath. EXAM: CHEST  1 VIEW COMPARISON:  October 22, 2020. FINDINGS: Stable cardiomediastinal silhouette. Left lung is clear. Minimal right basilar subsegmental atelectasis is noted. Bony thorax is unremarkable. IMPRESSION: Minimal right basilar subsegmental atelectasis. Electronically Signed   By: Lupita Raider M.D.   On: 12/07/2022 20:17     PROCEDURES:  Critical Care performed: No  .1-3 Lead EKG Interpretation  Performed by: Irean Hong, MD Authorized by: Irean Hong, MD     Interpretation: normal     ECG rate:  75   ECG rate assessment: normal     Rhythm: sinus rhythm     Ectopy: none     Conduction: normal   Comments:     Patient placed on cardiac monitor to evaluate for arrhythmias    MEDICATIONS ORDERED IN ED: Medications  ipratropium-albuterol (DUONEB) 0.5-2.5 (3) MG/3ML nebulizer solution 3 mL (3 mLs Nebulization Given 12/08/22 0002)  potassium chloride SA (KLOR-CON M) CR tablet 40 mEq (40 mEq Oral Given 12/08/22 0001)     IMPRESSION / MDM / ASSESSMENT AND PLAN / ED COURSE  I reviewed the triage vital signs and the nursing notes.                             47 year old female presenting with a 3-week history of shortness of breath. Differential includes, but is not limited to, viral syndrome, bronchitis  including COPD exacerbation, pneumonia, reactive airway disease including asthma, CHF including exacerbation with or without pulmonary/interstitial edema, pneumothorax, ACS, thoracic trauma, and pulmonary embolism.  I personally reviewed patient's records and note a rheumatology office visit on 10/04/2022 for follow-up rheumatoid arthritis.  Patient's presentation is most consistent with acute presentation with potential threat to life or bodily function.  Patient was placed on cardiac monitor to evaluate for arrhythmias.  Laboratory results demonstrate normal WBC of 6, mild hypokalemia with potassium 3.3, initial troponin and COVID-negative.  Mild right basilar atelectasis seen on chest x-ray.  Will repeat troponin, check D-dimer and BNP.  Administer DuoNeb and oral potassium replete meant.  Will reassess.  Clinical Course as of 12/08/22 0635  Wed Dec 08, 2022  0110 Repeat troponin and D-dimer negative.  Patient feeling better.  Will start prednisone burst, prescribe albuterol inhaler and patient will follow-up with her PCP.  Strict return precautions given.  Patient verbalizes understanding and agrees with plan of care. [JS]    Clinical Course User Index [JS] Irean Hong, MD     FINAL CLINICAL IMPRESSION(S) / ED DIAGNOSES   Final diagnoses:  Shortness of breath  Mild asthma with exacerbation, unspecified whether persistent     Rx / DC Orders   ED Discharge Orders          Ordered    predniSONE (DELTASONE) 20 MG tablet  Status:  Discontinued        12/08/22 0111    albuterol (VENTOLIN HFA) 108 (90 Base) MCG/ACT inhaler  Every 4 hours PRN,   Status:  Discontinued        12/08/22 0111    albuterol (VENTOLIN HFA) 108 (90 Base) MCG/ACT inhaler  Every 4 hours PRN  12/08/22 0115             Note:  This document was prepared using Dragon voice recognition software and may include unintentional dictation errors.   Irean Hong, MD 12/08/22 [phone]

## 2022-12-08 LAB — TROPONIN I (HIGH SENSITIVITY): Troponin I (High Sensitivity): <2 ng/L (ref <18)

## 2022-12-08 LAB — BRAIN NATRIURETIC PEPTIDE: B Natriuretic Peptide: 4.5 pg/mL (ref 0.0–100.0)

## 2022-12-08 LAB — D-DIMER, QUANTITATIVE: D-Dimer, Quant: 0.3 ug/mL-FEU (ref 0.00–0.50)

## 2022-12-08 MED ORDER — PREDNISONE 20 MG PO TABS: ORAL_TABLET | ORAL | 0 refills | Status: DC

## 2022-12-08 MED ORDER — ALBUTEROL SULFATE HFA 108 (90 BASE) MCG/ACT IN AERS: 2.0000 | INHALATION_SPRAY | RESPIRATORY_TRACT | 0 refills | Status: AC | PRN

## 2022-12-08 MED ORDER — ALBUTEROL SULFATE HFA 108 (90 BASE) MCG/ACT IN AERS: 2.0000 | INHALATION_SPRAY | RESPIRATORY_TRACT | 0 refills | Status: DC | PRN

## 2022-12-08 NOTE — Discharge Instructions (Addendum)
Use albuterol inhaler 2 puffs every 4 hours as needed for difficulty breathing. 2.  Return to the ER for worsening symptoms, persistent vomiting, difficulty breathing or other concerns.

## 2023-09-09 ENCOUNTER — Other Ambulatory Visit: Payer: Self-pay

## 2023-09-09 ENCOUNTER — Emergency Department
Admission: EM | Admit: 2023-09-09 | Discharge: 2023-09-09 | Disposition: A | Discharge status: Home / Self Care | Payer: Managed Care, Other (non HMO) | Attending: Emergency Medicine | Admitting: Emergency Medicine

## 2023-09-09 DIAGNOSIS — E876 Hypokalemia: Secondary | ICD-10-CM | POA: Diagnosis not present

## 2023-09-09 DIAGNOSIS — R0789 Other chest pain: Secondary | ICD-10-CM | POA: Insufficient documentation

## 2023-09-09 DIAGNOSIS — I10 Essential (primary) hypertension: Secondary | ICD-10-CM | POA: Insufficient documentation

## 2023-09-09 DIAGNOSIS — R079 Chest pain, unspecified: Secondary | ICD-10-CM

## 2023-09-09 DIAGNOSIS — R42 Dizziness and giddiness: Secondary | ICD-10-CM | POA: Diagnosis not present

## 2023-09-09 LAB — URINALYSIS, ROUTINE W REFLEX MICROSCOPIC
Bilirubin Urine: NEGATIVE
Glucose, UA: NEGATIVE mg/dL
Hgb urine dipstick: NEGATIVE
Ketones, ur: NEGATIVE mg/dL
Leukocytes,Ua: NEGATIVE
Nitrite: NEGATIVE
Protein, ur: NEGATIVE mg/dL
Specific Gravity, Urine: 1.019 (ref 1.005–1.030)
pH: 5 (ref 5.0–8.0)

## 2023-09-09 LAB — TROPONIN I (HIGH SENSITIVITY)
Troponin I (High Sensitivity): 3 ng/L (ref <18)
Troponin I (High Sensitivity): 3 ng/L (ref <18)

## 2023-09-09 LAB — BASIC METABOLIC PANEL
Anion gap: 12 (ref 5–15)
BUN: 16 mg/dL (ref 6–20)
CO2: 27 mmol/L (ref 22–32)
Calcium: 8.8 mg/dL — ABNORMAL LOW (ref 8.9–10.3)
Chloride: 102 mmol/L (ref 98–111)
Creatinine, Ser: 0.76 mg/dL (ref 0.44–1.00)
GFR, Estimated: >60 mL/min (ref >60)
Glucose, Bld: 110 mg/dL — ABNORMAL HIGH (ref 70–99)
Potassium: 3.4 mmol/L — ABNORMAL LOW (ref 3.5–5.1)
Sodium: 141 mmol/L (ref 135–145)

## 2023-09-09 LAB — CBC
HCT: 37.1 % (ref 36.0–46.0)
Hemoglobin: 12.4 g/dL (ref 12.0–15.0)
MCH: 30.2 pg (ref 26.0–34.0)
MCHC: 33.4 g/dL (ref 30.0–36.0)
MCV: 90.3 fL (ref 80.0–100.0)
Platelets: 278 10*3/uL (ref 150–400)
RBC: 4.11 MIL/uL (ref 3.87–5.11)
RDW: 13.9 % (ref 11.5–15.5)
WBC: 5.5 10*3/uL (ref 4.0–10.5)
nRBC: 0 % (ref 0.0–0.2)

## 2023-09-09 LAB — CBG MONITORING, ED: Glucose-Capillary: 102 mg/dL — ABNORMAL HIGH (ref 70–99)

## 2023-09-09 NOTE — ED Provider Notes (Signed)
 [facility] Provider Note    Event Date/Time   First MD Initiated Contact with Patient 09/09/23 2042     (approximate)   History   Dizziness   HPI  Terri Chambers is a 48 y.o. female with a history of hypertension and previous distant history of myasthenia  Patient was at home, she works from home.  She noticed today that she started feeling a little bit just of a lightheaded sensation and very briefly felt like she had a little bit of slight chest pressure.  She lives by herself, and associates that she felt this way not long after eating some fish and popcorn and does not know if that having eaten something had anything to do with her symptoms  Her blood pressure was a bit elevated at home normally runs about 130s and she tracks it regularly, today it was elevated to about 160.  She does follow closely with her primary care   No radiating chest pain.  No her personal history of coronary disease does have a history of hypertension.  Patient denies pregnancy  Physical Exam   Triage Vital Signs: ED Triage Vitals  Encounter Vitals Group     BP 09/09/23 1837 (!) 167/101     Systolic BP Percentile --      Diastolic BP Percentile --      Pulse Rate 09/09/23 1837 66     Resp 09/09/23 1837 17     Temp 09/09/23 1837 98.1 F (36.7 C)     Temp Source 09/09/23 1837 Oral     SpO2 09/09/23 1837 100 %     Weight --      Height --      Head Circumference --      Peak Flow --      Pain Score 09/09/23 1849 0     Pain Loc --      Pain Education --      Exclude from Growth Chart --     Most recent vital signs: Vitals:   09/09/23 1837 09/09/23 2045  BP: (!) 167/101 (!) 142/97  Pulse: 66 (!) 59  Resp: 17 16  Temp: 98.1 F (36.7 C) 98.3 F (36.8 C)  SpO2: 100% 97%     General: Awake, no distress.  Fully alert sitting on the edge of the stretcher without distress very pleasant.  Reports no ongoing symptoms no further chest discomfort lightheadedness  or other symptoms CV:  Good peripheral perfusion.  Normal tones and rate Resp:  Normal effort.  Clear bilateral normal work of breathing Abd:  No distention.  Soft nontender nondistended Other:  No lower extremity edema.  Patient very pleasant nontoxic in no distress   ED Results / Procedures / Treatments   Labs (all labs ordered are listed, but only abnormal results are displayed) Labs Reviewed  BASIC METABOLIC PANEL - Abnormal; Notable for the following components:      Result Value   Potassium 3.4 (*)    Glucose, Bld 110 (*)    Calcium 8.8 (*)    All other components within normal limits  URINALYSIS, ROUTINE W REFLEX MICROSCOPIC - Abnormal; Notable for the following components:   Color, Urine YELLOW (*)    APPearance HAZY (*)    All other components within normal limits  CBG MONITORING, ED - Abnormal; Notable for the following components:   Glucose-Capillary 102 (*)    All other components within normal limits  CBC  TROPONIN I (HIGH SENSITIVITY)  TROPONIN I (HIGH SENSITIVITY)  TROPONIN I (HIGH SENSITIVITY)   Labs notable for normal CBC and metabolic panel.  Very minimal hypokalemia.  CBC normal.  Initial troponin normal repeat troponin normal  EKG  EKG interpreted by me at 1850 heart rate 65 QRS 80 QTc 440 Normal sinus rhythm.  Very mild felt to be repolarization abnormality noted.  No evidence of acute STEMI or obvious ACS   RADIOLOGY  Clear lungs, normal oxygen saturation no dyspnea.  No indication for chest imaging noted at this time   PROCEDURES:  Critical Care performed: No  Procedures   MEDICATIONS ORDERED IN ED: Medications - No data to display   IMPRESSION / MDM / ASSESSMENT AND PLAN / ED COURSE  I reviewed the triage vital signs and the nursing notes.                              Differential diagnosis includes, but is not limited to, possible orthostatic symptoms vasovagal, symptomatic hypertension though this seems less likely especially  given her blood pressure has improved to 142/97 without intervention, low risk by clinical history for ACS no personal history of coronary disease does carry history of hypertension however.  Initial troponin is normal and ECG reassuring.  No clinical findings would be highly suggestive or concerning for high risk for thromboembolism.  She is currently awake alert symptoms have completely resolved  Discussed with the patient I think follow-up with PCP as warranted and consideration for echocardiogram given her history as well as follow-up on hypertension would be reasonable and I have some suspicion she may have a very mild LVH based on her ECG.  Patient is very agreeable with this  Patient's presentation is most consistent with acute complicated illness / injury requiring diagnostic workup.          FINAL CLINICAL IMPRESSION(S) / ED DIAGNOSES   Final diagnoses:  Chest pain with low risk for cardiac etiology     Rx / DC Orders   ED Discharge Orders          Ordered    Ambulatory referral to Cardiology       Comments: Chest pain follow-up and consider Echocardiogram (? [facility])   09/09/23 2233             Note:  This document was prepared using Dragon voice recognition software and may include unintentional dictation errors.   Dicky Anes, MD 09/09/23 2306

## 2023-09-09 NOTE — ED Notes (Signed)
 Pt made aware of need for urine sample. Pt denies HA or dizziness at this time, VSS.

## 2023-09-09 NOTE — ED Notes (Signed)
 ED Provider at bedside.

## 2023-09-09 NOTE — ED Triage Notes (Signed)
 Pt to ED via POV from home. Pt reports dizziness and almost passed out. Pt reports mild HA. Pt concerned about her glucose. Pt reports pre-diabetic. Pt denies N/V/D. Pt also reports chest tightness.

## 2023-09-09 NOTE — Discharge Instructions (Signed)

## 2023-09-09 NOTE — ED Notes (Signed)
Green top sent to lab

## 2023-09-23 ENCOUNTER — Encounter: Payer: Self-pay | Admitting: General Practice

## 2023-12-20 ENCOUNTER — Emergency Department (HOSPITAL_COMMUNITY)
Admission: EM | Admit: 2023-12-20 | Discharge: 2023-12-21 | Disposition: A | Discharge status: Home / Self Care | Attending: Emergency Medicine | Admitting: Emergency Medicine

## 2023-12-20 DIAGNOSIS — R0602 Shortness of breath: Secondary | ICD-10-CM | POA: Diagnosis present

## 2023-12-20 DIAGNOSIS — I517 Cardiomegaly: Secondary | ICD-10-CM | POA: Diagnosis not present

## 2023-12-20 DIAGNOSIS — R600 Localized edema: Secondary | ICD-10-CM | POA: Diagnosis not present

## 2023-12-20 DIAGNOSIS — M79661 Pain in right lower leg: Secondary | ICD-10-CM | POA: Insufficient documentation

## 2023-12-20 DIAGNOSIS — M79604 Pain in right leg: Secondary | ICD-10-CM

## 2023-12-20 DIAGNOSIS — I272 Pulmonary hypertension, unspecified: Secondary | ICD-10-CM | POA: Diagnosis not present

## 2023-12-21 ENCOUNTER — Emergency Department (HOSPITAL_COMMUNITY)

## 2023-12-21 ENCOUNTER — Encounter (HOSPITAL_COMMUNITY): Payer: Self-pay

## 2023-12-21 ENCOUNTER — Other Ambulatory Visit: Payer: Self-pay

## 2023-12-21 LAB — CBC WITH DIFFERENTIAL/PLATELET
Abs Immature Granulocytes: 0.02 10*3/uL (ref 0.00–0.07)
Basophils Absolute: 0 10*3/uL (ref 0.0–0.1)
Basophils Relative: 0 %
Eosinophils Absolute: 0.1 10*3/uL (ref 0.0–0.5)
Eosinophils Relative: 2 %
HCT: 34.9 % — ABNORMAL LOW (ref 36.0–46.0)
Hemoglobin: 11.6 g/dL — ABNORMAL LOW (ref 12.0–15.0)
Immature Granulocytes: 0 %
Lymphocytes Relative: 36 %
Lymphs Abs: 2.3 10*3/uL (ref 0.7–4.0)
MCH: 29.9 pg (ref 26.0–34.0)
MCHC: 33.2 g/dL (ref 30.0–36.0)
MCV: 89.9 fL (ref 80.0–100.0)
Monocytes Absolute: 0.6 10*3/uL (ref 0.1–1.0)
Monocytes Relative: 10 %
Neutro Abs: 3.4 10*3/uL (ref 1.7–7.7)
Neutrophils Relative %: 52 %
Platelets: 277 10*3/uL (ref 150–400)
RBC: 3.88 MIL/uL (ref 3.87–5.11)
RDW: 14.3 % (ref 11.5–15.5)
WBC: 6.5 10*3/uL (ref 4.0–10.5)
nRBC: 0 % (ref 0.0–0.2)

## 2023-12-21 LAB — HCG, SERUM, QUALITATIVE: Preg, Serum: NEGATIVE

## 2023-12-21 LAB — BASIC METABOLIC PANEL WITH GFR
Anion gap: 6 (ref 5–15)
BUN: 20 mg/dL (ref 6–20)
CO2: 25 mmol/L (ref 22–32)
Calcium: 8.8 mg/dL — ABNORMAL LOW (ref 8.9–10.3)
Chloride: 107 mmol/L (ref 98–111)
Creatinine, Ser: 0.76 mg/dL (ref 0.44–1.00)
GFR, Estimated: >60 mL/min (ref >60)
Glucose, Bld: 112 mg/dL — ABNORMAL HIGH (ref 70–99)
Potassium: 3.3 mmol/L — ABNORMAL LOW (ref 3.5–5.1)
Sodium: 138 mmol/L (ref 135–145)

## 2023-12-21 LAB — TROPONIN I (HIGH SENSITIVITY): Troponin I (High Sensitivity): <2 ng/L (ref <18)

## 2023-12-21 LAB — BRAIN NATRIURETIC PEPTIDE: B Natriuretic Peptide: 8.3 pg/mL (ref 0.0–100.0)

## 2023-12-21 MED ORDER — POTASSIUM CHLORIDE CRYS ER 20 MEQ PO TBCR: 20.0000 meq | EXTENDED_RELEASE_TABLET | Freq: Every day | ORAL | 0 refills | Status: DC

## 2023-12-21 MED ORDER — FUROSEMIDE 20 MG PO TABS
20.0000 mg | ORAL_TABLET | Freq: Every day | ORAL | 0 refills | Status: DC
Start: 2023-12-21 — End: 2024-03-26

## 2023-12-21 MED ORDER — IOHEXOL 350 MG/ML SOLN
100.0000 mL | Freq: Once | INTRAVENOUS | Status: AC | PRN
  Administered 2023-12-21: 100 mL via INTRAVENOUS

## 2023-12-21 NOTE — ED Notes (Signed)
Pt was ambulatory to the restroom

## 2023-12-21 NOTE — ED Provider Notes (Signed)
 [place] EMERGENCY DEPARTMENT AT [facility] Provider Note   CSN: [id number] Arrival date & time: 12/20/23  2342   History  Chief Complaint  Patient presents with   Leg Swelling    Terri Chambers is a 48 y.o. female here for evaluation of lower extremity swelling.  Started 2 days ago. Has had this issue previously when she increases her salt intake typically her lower extremity swelling will go down with elevation however has not. She is also noted some pain to her right leg. She is some shortness of breath when she lays flat.  No shortness of breath with exertion. No fever, chest pain, abdominal pain, nausea or vomiting. No cough. Does have a history of myasthenia gravis, denies any weakness. She notes symptoms do tend to flare when she has elevated blood pressure.  States she is compliant with her home medications.  No history of PE or DVT.  No recent surgery, immobilization or malignancy.  No prior history of CHF. She is on amlodipine  for her BPs at home  HPI     Home Medications Prior to Admission medications   Medication Sig Start Date End Date Taking? Authorizing Provider  furosemide (LASIX) 20 MG tablet Take 1 tablet (20 mg total) by mouth daily for 3 days. 12/21/23 12/24/23 Yes [place] Behlke A, PA-C  potassium chloride  SA (KLOR-CON  M) 20 MEQ tablet Take 1 tablet (20 mEq total) by mouth daily. 12/21/23  Yes Rhya Shan A, PA-C  albuterol  (VENTOLIN  HFA) 108 (90 Base) MCG/ACT inhaler Inhale 2 puffs into the lungs every 4 (four) hours as needed for wheezing or shortness of breath. 12/08/22   Sung, Jade J, MD  amLODipine  (NORVASC ) 10 MG tablet Take 1 tablet (10 mg total) by mouth daily. 11/08/20 05/07/21  Gonfa, Taye T, MD  atenolol  (TENORMIN ) 100 MG tablet Take 100 mg by mouth every morning.     [provider]  celecoxib  (CELEBREX ) 100 MG capsule Take 100 mg by mouth 2 (two) times daily. 08/23/22   [provider]  cloNIDine  (CATAPRES  - DOSED IN MG/24  HR) 0.3 mg/24hr patch Apply 2 patch to skin once a week 11/04/20   [provider]  folic acid (FOLVITE) 1 MG tablet Take 1 mg by mouth daily.    [provider]  hydroxychloroquine (PLAQUENIL) 200 MG tablet Take 200 mg by mouth 2 (two) times daily.    [provider]  losartan  (COZAAR ) 100 MG tablet Take 100 mg by mouth daily.    [provider]  methotrexate (RHEUMATREX) 2.5 MG tablet Take 20 mg by mouth once a week.    [provider]  NEOMYCIN -POLYMYXIN-HYDROCORTISONE (CORTISPORIN) 1 % SOLN OTIC solution Apply 1-2 drops to toe BID after soaking 09/13/22   Hyatt, Max T, DPM  OZEMPIC, 0.25 OR 0.5 MG/DOSE, 2 MG/1.5ML SOPN Inject 0.25 mg into the skin once a week. 10/08/20   [provider]  Vitamin D, Ergocalciferol, (DRISDOL) 1.25 MG (50000 UNIT) CAPS capsule Take 50,000 Units by mouth once a week. 08/19/22   [provider]      Allergies    Amlodipine     Review of Systems   Review of Systems  Constitutional: Negative.   HENT: Negative.    Respiratory:  Positive for shortness of breath. Negative for cough, chest tightness, wheezing and stridor.   Cardiovascular:  Positive for leg swelling. Negative for chest pain and palpitations.  Gastrointestinal: Negative.   Genitourinary: Negative.   Musculoskeletal: Negative.  Skin: Negative.   Neurological: Negative.   All other systems reviewed and are negative.  Physical Exam Updated Vital Signs BP 125/78   Pulse 66   Temp 98.3 F (36.8 C) (Oral)   Resp 19   Ht 5\' 8"  (1.727 m)   Wt (!) 145.2 kg   SpO2 99%   BMI 48.66 kg/m  Physical Exam Vitals and nursing note reviewed.  Constitutional:      General: She is not in acute distress.    Appearance: She is well-developed. She is not ill-appearing.  HENT:     Head: Normocephalic and atraumatic.     Nose: Nose normal.     Mouth/Throat:     Mouth: Mucous membranes are moist.  Eyes:     Pupils: Pupils are equal, round, and  reactive to light.  Cardiovascular:     Rate and Rhythm: Normal rate.     Pulses: Normal pulses.     Heart sounds: Normal heart sounds.  Pulmonary:     Effort: Pulmonary effort is normal. No respiratory distress.     Breath sounds: Normal breath sounds.  Abdominal:     General: Bowel sounds are normal. There is no distension.     Palpations: Abdomen is soft.     Tenderness: There is no abdominal tenderness. There is no right CVA tenderness, left CVA tenderness or guarding.     Hernia: No hernia is present.  Musculoskeletal:        General: Tenderness present. No swelling. Normal range of motion.     Cervical back: Normal range of motion.     Right lower leg: Edema present.     Left lower leg: Edema present.     Comments: Tenderness right lower extremity. BLE edema, no erythema, warmth  Skin:    General: Skin is warm and dry.     Capillary Refill: Capillary refill takes less than 2 seconds.  Neurological:     General: No focal deficit present.     Mental Status: She is alert.  Psychiatric:        Mood and Affect: Mood normal.    ED Results / Procedures / Treatments   Labs (all labs ordered are listed, but only abnormal results are displayed) Labs Reviewed  CBC WITH DIFFERENTIAL/PLATELET - Abnormal; Notable for the following components:      Result Value   Hemoglobin 11.6 (*)    HCT 34.9 (*)    All other components within normal limits  BASIC METABOLIC PANEL WITH GFR - Abnormal; Notable for the following components:   Potassium 3.3 (*)    Glucose, Bld 112 (*)    Calcium 8.8 (*)    All other components within normal limits  BRAIN NATRIURETIC PEPTIDE  HCG, SERUM, QUALITATIVE  TROPONIN I (HIGH SENSITIVITY)    EKG EKG Interpretation Date/Time:  Wednesday Dec 21 2023 02:58:01 EDT Ventricular Rate:  67 PR Interval:  189 QRS Duration:  91 QT Interval:  430 QTC Calculation: 454 R Axis:   -1  Text Interpretation: Sinus rhythm Borderline T wave abnormalities Confirmed by  Eldon Greenland ([id number]) on 12/21/2023 3:03:03 AM  Radiology CT Angio Chest PE W and/or Wo Contrast Result Date: 12/21/2023 CLINICAL DATA:  Pulmonary embolism suspected, high probability. Bilateral leg swelling for 3 days. EXAM: CT ANGIOGRAPHY CHEST WITH CONTRAST TECHNIQUE: Multidetector CT imaging of the chest was performed using the standard protocol during bolus administration of intravenous contrast. Multiplanar CT image reconstructions and MIPs were obtained to evaluate the vascular anatomy.  RADIATION DOSE REDUCTION: This exam was performed according to the departmental dose-optimization program which includes automated exposure control, adjustment of the mA and/or kV according to patient size and/or use of iterative reconstruction technique. CONTRAST:  OMNIPAQUE  IOHEXOL  350 MG/ML SOLN COMPARISON:  11/07/2020 FINDINGS: Cardiovascular: Satisfactory opacification of the pulmonary arteries to the segmental level. No evidence of pulmonary embolism. Large heart size. No pericardial effusion. Enlarged main pulmonary artery at 3.2 cm, compatible with pulmonary hypertension. Mediastinum/Nodes: Negative for mass or adenopathy Lungs/Pleura: There is no edema, consolidation, effusion, or pneumothorax. Upper Abdomen: No acute finding Musculoskeletal: No acute finding Review of the MIP images confirms the above findings. IMPRESSION: No acute finding, including pulmonary embolism. Cardiomegaly and findings of pulmonary hypertension. Electronically Signed   By: Ronnette Coke M.D.   On: 12/21/2023 04:51   DG Chest 2 View Result Date: 12/21/2023 CLINICAL DATA:  Dyspnea, bilateral leg swelling EXAM: CHEST - 2 VIEW COMPARISON:  None Available. FINDINGS: Lungs are clear. Stable eventration of the right hemidiaphragm. No pneumothorax or pleural effusion. Cardiac size is mildly enlarged, unchanged. Pulmonary vascularity is normal. No acute bone abnormality. IMPRESSION: 1. No active cardiopulmonary disease. 2. Mild  cardiomegaly. Electronically Signed   By: Worthy Heads M.D.   On: 12/21/2023 02:21    Procedures Procedures    Medications Ordered in ED Medications  iohexol  (OMNIPAQUE ) 350 MG/ML injection 100 mL (100 mLs Intravenous Contrast Given 12/21/23 0420)   ED Course/ Medical Decision Making/ A&P   48 year old here for evaluation of lower extremity swelling and right leg pain.  Started few days ago.  Has had previously however typically resolves with OTC remedies at home.  Also noted she had some shortness of breath when laying flat yesterday evening.  No history of CHF.  She is on amlodipine  for her blood pressure however no new changes to this.  No recent surgery, immobilization or malignancy.  She does have some tenderness to her right lower extremity on exam.  No obvious infectious process denies any recent injury or trauma.  Plan on labs, imaging and reassess.  Has a history of myasthenia gravis however history and exam do not correlate with MG crisis.  Labs and imaging personally viewed and interpreted:  CBC without leukocytosis Metabolic panel potassium 3.3 Trop <2 BNP 8.3 DG chest with cardiomegaly CTA with cardiomegaly and pulmonary hypertension EKG without ischemic changes  Discussed results with patient.  Referral to cardiology. 3 days of Lasix for lower extremity edema.  Did supplement her potassium as well.  She will follow-up outpatient for her ultrasounds of her lower extremities given her right leg pain to r/o DVT.  Patient states she is on amlodipine , medications could also be causing her lower extremity edema as well.  Encouraged her to touch base with her PCP to see if she needs switch of one of her BP meds if no improvement after lasix trial.  Patient agreeable with plan.  Will have her follow-up outpatient, return for any worsening symptoms.  DDX: new onset heart failure, PE, infectious process, pneumothorax, ACS, unstable angina, DVT, ischemia, septic joint, fracture,  cellulitis, abscess, myasthenia gravis exacerbation, adverse effect of medication                                 Medical Decision Making Amount and/or Complexity of Data Reviewed External Data Reviewed: labs, radiology, ECG and notes. Labs: ordered. Decision-making details documented in ED Course. Radiology: ordered and  independent interpretation performed. Decision-making details documented in ED Course. ECG/medicine tests: ordered and independent interpretation performed. Decision-making details documented in ED Course.  Risk OTC drugs. Prescription drug management. Parenteral controlled substances. Decision regarding hospitalization. Diagnosis or treatment significantly limited by social determinants of health.          Final Clinical Impression(s) / ED Diagnoses Final diagnoses:  Cardiomegaly  Pulmonary hypertension (HCC)  Lower extremity edema  Right leg pain    Rx / DC Orders ED Discharge Orders          Ordered    Ambulatory referral to Cardiology       Comments: If you have not heard from the Cardiology office within the next 72 hours please call [phone].   12/21/23 0519    furosemide (LASIX) 20 MG tablet  Daily        12/21/23 0519    [place] [facility]        12/21/23 0519    potassium chloride  SA (KLOR-CON  M) 20 MEQ tablet  Daily        12/21/23 0525              Ellysia Char A, PA-C 12/21/23 0529    Eldon Greenland, MD 12/21/23 [phone]

## 2023-12-21 NOTE — Discharge Instructions (Signed)
 It was a pleasure taking care of you here in the emergency department  Your lab work was unremarkable  As we discussed in the room your CT scan did show cardiomegaly which is an enlarged heart as well as pulmonary hypertension which are elevated blood pressures in the lungs leading to the heart.  I have placed an order to have ultrasounds done of your lower extremities.  I am starting on Lasix, a diuretic which is a medication that will help you to urinate to help with your lower extremity swelling.  I have also placed an order for cardiology.  If you do not hear from them within 2 days to schedule an appointment please call the number listed in your discharge paperwork  Follow the instructions on your discharge paperwork as well for your ultrasounds  Follow-up outpatient, return for any worsening symptoms

## 2023-12-21 NOTE — ED Triage Notes (Signed)
 Patient c/o bilateral leg swelling that caused her feet and ankles to hurt 3 days ago.  Patient stated that has had increased salt intake recently. She said if she eats something salty her legs normally go back down and don't stay swollen this long so now she is concerned. Patient stated she feel SOB as well when she lays down at night. O2 sats 97% room air in triage.

## 2023-12-23 ENCOUNTER — Ambulatory Visit (HOSPITAL_COMMUNITY)
Admission: RE | Admit: 2023-12-23 | Discharge: 2023-12-23 | Disposition: A | Discharge status: Home / Self Care | Source: Ambulatory Visit | Attending: Physician Assistant | Admitting: Physician Assistant

## 2023-12-23 DIAGNOSIS — M7989 Other specified soft tissue disorders: Secondary | ICD-10-CM | POA: Diagnosis present

## 2024-01-18 ENCOUNTER — Ambulatory Visit: Admitting: Cardiology

## 2024-01-28 NOTE — Progress Notes (Unsigned)
 Cardiology Office Note    Date:  01/30/2024  ID:  Terri Chambers, DOB [birth year], MRN [id number] PCP:  Corlis Pagan, NP  Cardiologist:  Lynwood Schilling, MD  Electrophysiologist:  None   Chief Complaint: Follow up for lower extremity edema  History of Present Illness: .    Terri Chambers is a 48 y.o. female with visit-pertinent history of hypertension and myesthenia gravis.   Patient was evaluated by Dr. Schilling on 03/26/2021 for hospital follow-up regarding hypertensive urgency.  Per notes patient had been admitted in April 2022 with hypertensive urgency and associated neurological and symptoms including numbness and tingling.  She had workup including MRI of the brain that was normal, CT head and neck that was unremarkable.  Carotid ultrasounds sounds were unremarkable.  Echocardiogram demonstrated no abnormalities including a negative bubble study.  At office visit patient reported having a reaction to her clonidine  patch, noted to have a severe rash on her arms.  Patient was sent home with atenolol , losartan , clonidine  patch and amlodipine .  Weight management and renal artery Dopplers recommended.  Patient was then lost to follow-up.  On 12/21/2023 patient presented to the ED for evaluation of lower extremity swelling.  Patient reported that 2 days ago her leg started swelling, notes history of this issue when increasing salt intake however the swelling would typically go down with elevation was not with this episode.  She also reported some pain to the right leg and shortness of breath when lying flat.  Denies shortness of breath on exertion.  CBC indicated hemoglobin 11.6, hematocrit 34.9, CMET indicated potassium 3.3, creatinine 0.76, BNP 8.3 CT angio chest showed no acute findings including pulmonary embolism.  Patient was referred to cardiology and started on Lasix  for 3 days with supplemental potassium.  She underwent ultrasound of lower extremities for right leg pain to rule out DVT,  showed no evidence of DVT.  Today she presents for follow-up.  She reports that she is doing well overall.  She denies any chest pain, denies any current shortness of breath.  Patient reports that prior to going to the ED she was slightly short of breath with exertion and when lying down flat, notes that her legs had also remained swollen.  She reports significant improvement following use of Lasix , she reports that she has also completely limited sodium from her diet, with this she has not had any recurrence and shortness of breath or lower extremity edema aside from when she had a sodium heavy meal 2 days ago, edema has improved.  Patient plans to continue with a significant a low-sodium diet as she did would prefer not to take diuretics.  Patient notes she also has history of problems with low potassium, discussed that diuretic use could also contribute to this.  ROS: .   Today she denies chest pain, fatigue, palpitations, melena, hematuria, hemoptysis, diaphoresis, weakness, presyncope, syncope, orthopnea, and PND.  All other systems are reviewed and otherwise negative. Studies Reviewed: SABRA   EKG:  EKG is not ordered today. EKG reviewed from 12/21/2023 indicating sinus rhythm at 6 7 bpm with nonspecific T wave abnormalities. CV Studies: Cardiac studies reviewed are outlined and summarized above. Otherwise please see EMR for full report. Cardiac Studies & Procedures   ______________________________________________________________________________________________     ECHOCARDIOGRAM  ECHOCARDIOGRAM COMPLETE BUBBLE STUDY 11/08/2020  Narrative ECHOCARDIOGRAM REPORT    Patient Name:   Terri Chambers Date of Exam: 11/08/2020 Medical Rec #:  [id number]  Height:       68.0 in Accession #:    [id number]      Weight:       280.0 lb Date of Birth:  [birth year]        BSA:          2.358 m Patient Age:    45 years        BP:           197/126 mmHg Patient Gender: F               HR:           84  bpm. Exam Location:  Inpatient  Procedure: 2D Echo, 3D Echo, Cardiac Doppler, Color Doppler and Saline Contrast Bubble Study  Indications:    Stroke  History:        Patient has no prior history of Echocardiogram examinations. Risk Factors:Hypertension. Covid infection 2021.  Sonographer:    Ellouise Mose RDCS Referring Phys: [id number] ZACHARY PARAS [facility] ([facility])   Sonographer Comments: Patient is morbidly obese. Image acquisition challenging due to patient body habitus. IMPRESSIONS   1. Left ventricular ejection fraction, by estimation, is 60 to 65%. Left ventricular ejection fraction by 3D volume is 63 %. The left ventricle has normal function. The left ventricle has no regional wall motion abnormalities. There is mild concentric left ventricular hypertrophy. Left ventricular diastolic parameters are consistent with Grade I diastolic dysfunction (impaired relaxation). 2. Right ventricular systolic function is normal. The right ventricular size is normal. 3. The mitral valve is normal in structure. Trivial mitral valve regurgitation. 4. The aortic valve was not well visualized. Aortic valve regurgitation is not visualized. No aortic stenosis is present. 5. The inferior vena cava is dilated in size with >50% respiratory variability, suggesting right atrial pressure of 8 mmHg. 6. Agitated saline contrast bubble study was negative, with no evidence of any interatrial shunt.  Comparison(s): No prior Echocardiogram.  Conclusion(s)/Recommendation(s): No intracardiac source of embolism detected on this transthoracic study. A transesophageal echocardiogram is recommended to exclude cardiac source of embolism if clinically indicated.  FINDINGS Left Ventricle: Left ventricular ejection fraction, by estimation, is 60 to 65%. Left ventricular ejection fraction by 3D volume is 63 %. The left ventricle has normal function. The left ventricle has no regional wall motion abnormalities. The left ventricular  internal cavity size was normal in size. There is mild concentric left ventricular hypertrophy. Left ventricular diastolic parameters are consistent with Grade I diastolic dysfunction (impaired relaxation). Normal left ventricular filling pressure.  Right Ventricle: The right ventricular size is normal. No increase in right ventricular wall thickness. Right ventricular systolic function is normal.  Left Atrium: Left atrial size was normal in size.  Right Atrium: Right atrial size was normal in size.  Pericardium: There is no evidence of pericardial effusion.  Mitral Valve: The mitral valve is normal in structure. There is mild thickening of the mitral valve leaflet(s). Mild mitral annular calcification. Trivial mitral valve regurgitation.  Tricuspid Valve: The tricuspid valve is normal in structure. Tricuspid valve regurgitation is trivial.  Aortic Valve: The aortic valve was not well visualized. Aortic valve regurgitation is not visualized. No aortic stenosis is present.  Pulmonic Valve: The pulmonic valve was not well visualized. Pulmonic valve regurgitation is trivial.  Aorta: The aortic root and ascending aorta are structurally normal, with no evidence of dilitation.  Venous: The inferior vena cava is dilated in size with greater than 50% respiratory variability, suggesting right atrial pressure of 8 mmHg.  IAS/Shunts: No atrial level shunt detected by color flow Doppler. Agitated saline contrast was given intravenously to evaluate for intracardiac shunting. Agitated saline contrast bubble study was negative, with no evidence of any interatrial shunt.   LEFT VENTRICLE PLAX 2D LVIDd:         4.30 cm         Diastology LVIDs:         2.70 cm         LV e' medial:    9.14 cm/s LV PW:         1.60 cm         LV E/e' medial:  8.2 LV IVS:        1.50 cm         LV e' lateral:   10.90 cm/s LVOT diam:     1.70 cm         LV E/e' lateral: 6.9 LV SV:         59 LV SV Index:   25 LVOT  Area:     2.27 cm        3D Volume EF LV 3D EF:    Left ventricular LV Volumes (MOD)                            ejection LV vol d, MOD    97.7 ml                    fraction by A2C:                                        3D volume LV vol d, MOD    117.0 ml                   is 63 %. A4C: LV vol s, MOD    18.8 ml A2C:                           3D Volume EF: LV vol s, MOD    40.4 ml       3D EF:        63 % A4C: LV SV MOD A2C:   78.9 ml LV SV MOD A4C:   117.0 ml LV SV MOD BP:    80.7 ml  RIGHT VENTRICLE             IVC RV S prime:     10.33 cm/s  IVC diam: 2.20 cm TAPSE (M-mode): 2.3 cm  LEFT ATRIUM             Index       RIGHT ATRIUM           Index LA diam:        3.10 cm 1.31 cm/m  RA Area:     14.70 cm LA Vol (A2C):   28.8 ml 12.21 ml/m RA Volume:   32.40 ml  13.74 ml/m LA Vol (A4C):   32.1 ml 13.61 ml/m LA Biplane Vol: 30.5 ml 12.94 ml/m AORTIC VALVE LVOT Vmax:   130.00 cm/s LVOT Vmean:  96.800 cm/s LVOT VTI:    0.258 m  AORTA Ao Root diam: 2.90 cm Ao Asc diam:  3.20 cm  MITRAL VALVE MV Area (PHT): 2.50 cm    SHUNTS MV Decel  Time: 303 msec    Systemic VTI:  0.26 m MV E velocity: 75.00 cm/s  Systemic Diam: 1.70 cm MV A velocity: 75.80 cm/s MV E/A ratio:  0.99  Heather  Pemberton MD Electronically signed by Bettie Sorrow MD Signature Date/Time: 11/08/2020/2:24:39 PM    Final          ______________________________________________________________________________________________       Current Reported Medications:.    Current Meds  Medication Sig   albuterol  (VENTOLIN  HFA) 108 (90 Base) MCG/ACT inhaler Inhale 2 puffs into the lungs every 4 (four) hours as needed for wheezing or shortness of breath.   amLODipine  (NORVASC ) 10 MG tablet Take 1 tablet (10 mg total) by mouth daily.   atenolol  (TENORMIN ) 100 MG tablet Take 100 mg by mouth every morning.    buPROPion (WELLBUTRIN XL) 150 MG 24 hr tablet Take 150 mg by mouth daily.   folic acid  (FOLVITE) 1 MG tablet Take 1 mg by mouth daily.   hydroxychloroquine (PLAQUENIL) 200 MG tablet Take 200 mg by mouth 2 (two) times daily.   ketoconazole (NIZORAL) 2 % shampoo Apply 1 Application topically as needed.   losartan  (COZAAR ) 100 MG tablet Take 100 mg by mouth daily.   methotrexate (RHEUMATREX) 2.5 MG tablet Take 20 mg by mouth once a week.   potassium chloride  SA (KLOR-CON  M) 20 MEQ tablet Take 1 tablet (20 mEq total) by mouth daily.   Vitamin D, Ergocalciferol, (DRISDOL) 1.25 MG (50000 UNIT) CAPS capsule Take 50,000 Units by mouth once a week.   Physical Exam:    VS:  BP 126/74   Pulse 72   Ht 5' 8 (1.727 m)   Wt (!) 324 lb 6.4 oz (147.1 kg)   SpO2 93%   BMI 49.32 kg/m    Wt Readings from Last 3 Encounters:  01/30/24 (!) 324 lb 6.4 oz (147.1 kg)  12/21/23 (!) 320 lb (145.2 kg)  12/07/22 290 lb (131.5 kg)    GEN: Well nourished, well developed in no acute distress NECK: No JVD; No carotid bruits CARDIAC: RRR, no murmurs, rubs, gallops RESPIRATORY:  Clear to auscultation without rales, wheezing or rhonchi  ABDOMEN: Soft, non-tender, non-distended EXTREMITIES:  No edema; No acute deformity     Asessement and Plan:.    Lower extremity edema: Patient presented to the ED in May with increased lower extremity edema, orthopnea and shortness of breath.  BNP was 8.3, CT angio of the chest showed no acute findings including pulmonary embolism, it did note mild cardiomegaly as well as possible pulmonary hypertension.  She was treated with 3 days of Lasix , patient reports that her symptoms resolved with this.  She is now following a very strict low-sodium diet with significant improvement, she feels that this was related to increased sodium intake.  Patient would like to avoid diuretics if possible to avoid increased urination as well as a history of hypokalemia.  She appears euvolemic and well compensated on exam. Reviewed ED precautions.  She will notify the office of weight gain of 2  to 3 pounds overnight or 5 pounds in a week, increase shortness of breath or lower extremity edema.  Patient reports that her PCP completed follow-up lab work and kidney function potassium were stable following use of Lasix . Will check echocardiogram.  HTN: Blood pressure today 126/74.  Continue amlodipine  10 mg daily, atenolol  100 mg daily, losartan  100 mg daily.  Pulmonary HTN/shortness of breath: CT angio chest on 12/21/23 indicated enlarged main pulmonary artery at 3.2 cm.  Patient reports that prior to her ED evaluation she did note intermittent shortness of breath as well as some mild orthopnea, reports this has resolved with decrease salt intake.  Will further evaluate with echo as noted above.  Reviewed ED precautions.  Patient denies increased snoring, discussed sleep study, she is not interested at this time.  Will further discuss after echocardiogram.   Disposition: F/u with Zeniya Lapidus, NP in 8 weeks.   Signed, Kazmir Oki D Ridwan Bondy, NP

## 2024-01-30 ENCOUNTER — Encounter: Payer: Self-pay | Admitting: Cardiology

## 2024-01-30 ENCOUNTER — Ambulatory Visit: Attending: Cardiology | Admitting: Cardiology

## 2024-01-30 VITALS — BP 126/74 | HR 72 | Ht 68.0 in | Wt 324.4 lb

## 2024-01-30 DIAGNOSIS — R600 Localized edema: Secondary | ICD-10-CM

## 2024-01-30 DIAGNOSIS — R0602 Shortness of breath: Secondary | ICD-10-CM

## 2024-01-30 DIAGNOSIS — E876 Hypokalemia: Secondary | ICD-10-CM

## 2024-01-30 DIAGNOSIS — I10 Essential (primary) hypertension: Secondary | ICD-10-CM | POA: Diagnosis not present

## 2024-01-30 NOTE — Patient Instructions (Signed)
 Medication Instructions:  No changes *If you need a refill on your cardiac medications before your next appointment, please call your pharmacy*  Lab Work: No labs  Testing/Procedures: Your physician has requested that you have an echocardiogram. Echocardiography is a painless test that uses sound waves to create images of your heart. It provides your doctor with information about the size and shape of your heart and how well your heart's chambers and valves are working. This procedure takes approximately one hour. There are no restrictions for this procedure. Please do NOT wear cologne, perfume, aftershave, or lotions (deodorant is allowed). Please arrive 15 minutes prior to your appointment time.  Please note: We ask at that you not bring children with you during ultrasound (echo/ vascular) testing. Due to room size and safety concerns, children are not allowed in the ultrasound rooms during exams. Our front office staff cannot provide observation of children in our lobby area while testing is being conducted. An adult accompanying a patient to their appointment will only be allowed in the ultrasound room at the discretion of the ultrasound technician under special circumstances. We apologize for any inconvenience.  Follow-Up: At [facility], you and your health needs are our priority.  As part of our continuing mission to provide you with exceptional heart care, our providers are all part of one team.  This team includes your primary Cardiologist (physician) and Advanced Practice Providers or APPs (Physician Assistants and Nurse Practitioners) who all work together to provide you with the care you need, when you need it.  Your next appointment:   8 week(s)  Provider:   Katlyn West, NP   We recommend signing up for the patient portal called MyChart.  Sign up information is provided on this After Visit Summary.  MyChart is used to connect with patients for Virtual Visits  (Telemedicine).  Patients are able to view lab/test results, encounter notes, upcoming appointments, etc.  Non-urgent messages can be sent to your provider as well.   To learn more about what you can do with MyChart, go to [email or web address].

## 2024-02-20 DIAGNOSIS — Z1231 Encounter for screening mammogram for malignant neoplasm of breast: Secondary | ICD-10-CM

## 2024-03-13 ENCOUNTER — Other Ambulatory Visit: Payer: Self-pay

## 2024-03-13 ENCOUNTER — Ambulatory Visit: Payer: Self-pay | Admitting: Cardiology

## 2024-03-13 ENCOUNTER — Ambulatory Visit (HOSPITAL_COMMUNITY)
Admission: RE | Admit: 2024-03-13 | Discharge: 2024-03-13 | Disposition: A | Discharge status: Home / Self Care | Source: Ambulatory Visit | Attending: Cardiology | Admitting: Cardiology

## 2024-03-13 DIAGNOSIS — R0602 Shortness of breath: Secondary | ICD-10-CM | POA: Insufficient documentation

## 2024-03-13 DIAGNOSIS — Z1231 Encounter for screening mammogram for malignant neoplasm of breast: Secondary | ICD-10-CM

## 2024-03-13 DIAGNOSIS — R600 Localized edema: Secondary | ICD-10-CM | POA: Diagnosis present

## 2024-03-13 LAB — ECHOCARDIOGRAM COMPLETE
AR max vel: 1.91 cm2
AV Area VTI: 1.92 cm2
AV Area mean vel: 1.78 cm2
AV Mean grad: 9 mmHg
AV Peak grad: 16.6 mmHg
Ao pk vel: 2.04 m/s
Area-P 1/2: 2.58 cm2
S' Lateral: 2.8 cm

## 2024-03-25 NOTE — Progress Notes (Unsigned)
 Cardiology Office Note    Date:  03/26/2024  ID:  Terri Chambers, DOB 09/06/1975, MRN [id number] PCP:  Terri Pagan, NP  Cardiologist:  Terri Schilling, MD  Electrophysiologist:  None   Chief Complaint: Follow up for lower extremity edema   History of Present Illness: .    Terri Chambers is a 48 y.o. female with visit-pertinent history of hypertension and myesthenia gravis.   Patient was evaluated by Dr. Schilling on 03/26/2021 for hospital follow-up regarding hypertensive urgency.  Per notes patient had been admitted in April 2022 with hypertensive urgency and associated neurological and symptoms including numbness and tingling.  She had workup including MRI of the brain that was normal, CT head and neck that was unremarkable.  Carotid ultrasounds sounds were unremarkable.  Echocardiogram demonstrated no abnormalities including a negative bubble study.  At office visit patient reported having a reaction to her clonidine  patch, noted to have a severe rash on her arms.  Patient was sent home with atenolol , losartan , clonidine  patch and amlodipine .  Weight management and renal artery Dopplers recommended.  Patient was then lost to follow-up.  On 12/21/2023 patient presented to the ED for evaluation of lower extremity swelling.  Patient reported that 2 days ago her leg started swelling, notes history of this issue when increasing salt intake however the swelling would typically go down with elevation was not with this episode.  She also reported some pain to the right leg and shortness of breath when lying flat.  Denies shortness of breath on exertion.  CBC indicated hemoglobin 11.6, hematocrit 34.9, CMET indicated potassium 3.3, creatinine 0.76, BNP 8.3 CT angio chest showed no acute findings including pulmonary embolism.  Patient was referred to cardiology and started on Lasix  for 3 days with supplemental potassium.  She underwent ultrasound of lower extremities for right leg pain to rule out DVT,  showed no evidence of DVT.  Patient was seen in clinic on 01/30/2024.  She reported she been doing well.  Patient reported that prior to going to the ED she was slightly short of breath with exertion and when lying down flat, noted that her legs had remained swollen.  She reported significant improvement following use of Lasix  and that she had also complete limited sodium from her diet.  She noted that with that she had not had any further shortness of breath or lower extremity edema.  Sleep study was discussed, patient declined at that time.  Echocardiogram on 03/13/2024 indicated LVEF 60 to 65%, no RWMA, G1 DD, RV systolic function and size was normal, no evidence of mitral valve regurgitation or stenosis, aortic valve regurgitation was not visualized, no stenosis present.  Today she presents for follow-up.  She reports that she has been doing well. She denies chest pain, shortness of breath, lower extremity edema, orthopnea or pnd.  She denies any palpitations, presyncope or syncope.  She reports that she is working to increase her walking and is trying to stay consistent.  Reports that she has been tolerating well.  She denies any cardiac concerns or complaints at this time.  ROS: .   Today she denies chest pain, shortness of breath, lower extremity edema, fatigue, palpitations, melena, hematuria, hemoptysis, diaphoresis, weakness, presyncope, syncope, orthopnea, and PND.  All other systems are reviewed and otherwise negative. Studies Reviewed: SABRA   EKG:  EKG is not ordered today.  CV Studies: Cardiac studies reviewed are outlined and summarized above. Otherwise please see EMR for full report. Cardiac Studies &  Procedures   ______________________________________________________________________________________________     ECHOCARDIOGRAM  ECHOCARDIOGRAM COMPLETE 03/13/2024  Narrative ECHOCARDIOGRAM REPORT    Patient Name:   Terri Chambers Date of Exam: 03/13/2024 Medical Rec #:  [id number]        Height:       68.0 in Accession #:    [id number]      Weight:       324.4 lb Date of Birth:  [birth year]        BSA:          2.510 m Patient Age:    48 years        BP:           126/74 mmHg Patient Gender: F               HR:           91 bpm. Exam Location:  [address]  Procedure: 2D Echo, Cardiac Doppler and Color Doppler (Both Spectral and Color Flow Doppler were utilized during procedure).  Indications:    R06.00 SOB  History:        Patient has prior history of Echocardiogram examinations, most recent 11/08/2020. Signs/Symptoms:Shortness of Breath and Edema; Risk Factors:Hypertension and Morbid obesity.  Sonographer:    Terri Chambers RDCS Referring Phys: [id number] [facility] D [place] Fleek   Sonographer Comments: Technically difficult study due to poor echo windows and patient is obese. Image acquisition challenging due to patient body habitus. IMPRESSIONS   1. Left ventricular ejection fraction, by estimation, is 60 to 65%. The left ventricle has normal function. The left ventricle has no regional wall motion abnormalities. Left ventricular diastolic parameters are consistent with Grade I diastolic dysfunction (impaired relaxation). 2. Right ventricular systolic function is normal. The right ventricular size is normal. Tricuspid regurgitation signal is inadequate for assessing PA pressure. 3. The mitral valve is normal in structure. No evidence of mitral valve regurgitation. No evidence of mitral stenosis. 4. The aortic valve was not well visualized. Aortic valve regurgitation is not visualized. No aortic stenosis is present. 5. The inferior vena cava is normal in size with greater than 50% respiratory variability, suggesting right atrial pressure of 3 mmHg.  FINDINGS Left Ventricle: Left ventricular ejection fraction, by estimation, is 60 to 65%. The left ventricle has normal function. The left ventricle has no regional wall motion abnormalities. The left ventricular internal cavity  size was normal in size. There is no left ventricular hypertrophy. Left ventricular diastolic parameters are consistent with Grade I diastolic dysfunction (impaired relaxation).  Right Ventricle: The right ventricular size is normal. Right vetricular wall thickness was not well visualized. Right ventricular systolic function is normal. Tricuspid regurgitation signal is inadequate for assessing PA pressure.  Left Atrium: Left atrial size was normal in size.  Right Atrium: Right atrial size was normal in size.  Pericardium: There is no evidence of pericardial effusion.  Mitral Valve: The mitral valve is normal in structure. No evidence of mitral valve regurgitation. No evidence of mitral valve stenosis.  Tricuspid Valve: The tricuspid valve is normal in structure. Tricuspid valve regurgitation is not demonstrated.  Aortic Valve: The aortic valve was not well visualized. Aortic valve regurgitation is not visualized. No aortic stenosis is present. Aortic valve mean gradient measures 9.0 mmHg. Aortic valve peak gradient measures 16.6 mmHg. Aortic valve area, by VTI measures 1.92 cm.  Pulmonic Valve: The pulmonic valve was normal in structure. Pulmonic valve regurgitation is trivial.  Aorta: The aortic root is normal in size  and structure.  Venous: The inferior vena cava is normal in size with greater than 50% respiratory variability, suggesting right atrial pressure of 3 mmHg.  IAS/Shunts: No atrial level shunt detected by color flow Doppler.   LEFT VENTRICLE PLAX 2D LVIDd:         5.10 cm   Diastology LVIDs:         2.80 cm   LV e' medial:    6.96 cm/s LV PW:         1.00 cm   LV E/e' medial:  12.7 LV IVS:        1.10 cm   LV e' lateral:   9.25 cm/s LVOT diam:     2.00 cm   LV E/e' lateral: 9.6 LV SV:         76 LV SV Index:   30 LVOT Area:     3.14 cm   RIGHT VENTRICLE             IVC RV S prime:     16.90 cm/s  IVC diam: 1.40 cm TAPSE (M-mode): 2.6 cm  LEFT ATRIUM              Index        RIGHT ATRIUM           Index LA diam:        3.50 cm 1.39 cm/m   RA Pressure: 3.00 mmHg LA Vol (A2C):   59.5 ml 23.70 ml/m  RA Area:     12.50 cm LA Vol (A4C):   47.7 ml 19.00 ml/m  RA Volume:   29.90 ml  11.91 ml/m LA Biplane Vol: 55.6 ml 22.15 ml/m AORTIC VALVE AV Area (Vmax):    1.91 cm AV Area (Vmean):   1.78 cm AV Area (VTI):     1.92 cm AV Vmax:           203.50 cm/s AV Vmean:          137.000 cm/s AV VTI:            0.398 m AV Peak Grad:      16.6 mmHg AV Mean Grad:      9.0 mmHg LVOT Vmax:         124.00 cm/s LVOT Vmean:        77.800 cm/s LVOT VTI:          0.243 m LVOT/AV VTI ratio: 0.61  AORTA Ao Root diam: 2.80 cm Ao Asc diam:  3.30 cm  MITRAL VALVE               TRICUSPID VALVE MV Area (PHT): 2.58 cm    Estimated RAP:  3.00 mmHg MV Decel Time: 294 msec MV E velocity: 88.60 cm/s  SHUNTS MV A velocity: 81.20 cm/s  Systemic VTI:  0.24 m MV E/A ratio:  1.09        Systemic Diam: 2.00 cm  Dalton McleanMD Electronically signed by Ezra Kanner Signature Date/Time: 03/13/2024/9:35:41 AM    Final          ______________________________________________________________________________________________       Current Reported Medications:.    Current Meds  Medication Sig   albuterol  (VENTOLIN  HFA) 108 (90 Base) MCG/ACT inhaler Inhale 2 puffs into the lungs every 4 (four) hours as needed for wheezing or shortness of breath.   amLODipine  (NORVASC ) 10 MG tablet Take 1 tablet (10 mg total) by mouth daily.   atenolol  (TENORMIN ) 100 MG tablet Take 100 mg by  mouth every morning.    buPROPion (WELLBUTRIN XL) 150 MG 24 hr tablet Take 150 mg by mouth daily.   folic acid (FOLVITE) 1 MG tablet Take 1 mg by mouth daily.   hydroxychloroquine (PLAQUENIL) 200 MG tablet Take 200 mg by mouth 2 (two) times daily.   ketoconazole (NIZORAL) 2 % shampoo Apply 1 Application topically as needed.   losartan  (COZAAR ) 100 MG tablet Take 100 mg by mouth daily.    methotrexate (RHEUMATREX) 2.5 MG tablet Take 20 mg by mouth once a week.   NEOMYCIN -POLYMYXIN-HYDROCORTISONE (CORTISPORIN) 1 % SOLN OTIC solution Apply 1-2 drops to toe BID after soaking   Vitamin D-Vitamin K (VITAMIN K2-VITAMIN D3 PO) Take 1 capsule by mouth daily at 2 am.    Physical Exam:    VS:  BP 122/81   Pulse 76   Ht 5' 8 (1.727 m)   Wt (!) 327 lb (148.3 kg)   LMP  (LMP Unknown)   SpO2 95%   BMI 49.72 kg/m    Wt Readings from Last 3 Encounters:  03/26/24 (!) 327 lb (148.3 kg)  01/30/24 (!) 324 lb 6.4 oz (147.1 kg)  12/21/23 (!) 320 lb (145.2 kg)    GEN: Well nourished, well developed in no acute distress NECK: No JVD; No carotid bruits CARDIAC: RRR, no murmurs, rubs, gallops RESPIRATORY:  Clear to auscultation without rales, wheezing or rhonchi  ABDOMEN: Soft, non-tender, non-distended EXTREMITIES:  No edema; No acute deformity     Asessement and Plan:.    Lower extremity edema: Patient presented to the ED in May with increased lower extremity edema, orthopnea and shortness of breath.  BNP was 8.3, CT angio of the chest showed no acute findings including pulmonary embolism, it did note mild cardiomegaly as well as possible pulmonary hypertension.  She was treated with 3 days of Lasix , patient reports that her symptoms resolved with this. Echocardiogram on 03/13/2024 indicated LVEF 60 to 65%, no RWMA, G1 DD, RV systolic function and size was normal, no evidence of mitral valve regurgitation or stenosis, aortic valve regurgitation was not visualized, no stenosis present.  Today patient reports that she is doing very well.  She denies any shortness of breath or increased lower extremity edema.  She is maintaining a low-sodium diet and tolerating well.  Encouraged to continue low-sodium diet.  Reviewed ED precautions.  HTN: Blood pressure today 122/81. Continue amlodipine  10 mg daily, atenolol  100 mg daily, losartan  100 mg daily.  Pulmonary HTN: CT angio chest on 12/21/23  indicated enlarged main pulmonary artery at 3.2 cm.  Patient reports that prior to her ED evaluation she did note intermittent shortness of breath as well as some mild orthopnea, reports this has resolved with decrease salt intake. Echocardiogram on 03/13/2024 indicated LVEF 60 to 65%, no RWMA, G1 DD, RV systolic function and size was normal. Today she reports that she is doing well, denies any shortness of breath or further lower extremity edema.  She will continue with low-sodium diet.   Disposition: Patient will follow-up as needed per request.  Signed, Breean Nannini D Delane Wessinger, NP

## 2024-03-26 ENCOUNTER — Ambulatory Visit: Attending: Cardiology | Admitting: Cardiology

## 2024-03-26 ENCOUNTER — Encounter: Payer: Self-pay | Admitting: Cardiology

## 2024-03-26 VITALS — BP 122/81 | HR 76 | Ht 68.0 in | Wt 327.0 lb

## 2024-03-26 DIAGNOSIS — R0602 Shortness of breath: Secondary | ICD-10-CM

## 2024-03-26 DIAGNOSIS — I10 Essential (primary) hypertension: Secondary | ICD-10-CM

## 2024-03-26 DIAGNOSIS — R600 Localized edema: Secondary | ICD-10-CM | POA: Diagnosis not present

## 2024-03-26 NOTE — Patient Instructions (Signed)
 Medication Instructions:  No changes *If you need a refill on your cardiac medications before your next appointment, please call your pharmacy*  Lab Work: No labs If you have labs (blood work) drawn today and your tests are completely normal, you will receive your results only by: MyChart Message (if you have MyChart) OR A paper copy in the mail If you have any lab test that is abnormal or we need to change your treatment, we will call you to review the results.  Testing/Procedures: No testing  Follow-Up: At [facility], you and your health needs are our priority.  As part of our continuing mission to provide you with exceptional heart care, our providers are all part of one team.  This team includes your primary Cardiologist (physician) and Advanced Practice Providers or APPs (Physician Assistants and Nurse Practitioners) who all work together to provide you with the care you need, when you need it.  Your next appointment:   As needed

## 2024-04-05 ENCOUNTER — Ambulatory Visit
Admission: RE | Admit: 2024-04-05 | Discharge: 2024-04-05 | Disposition: A | Discharge status: Home / Self Care | Source: Ambulatory Visit

## 2024-04-05 DIAGNOSIS — Z1231 Encounter for screening mammogram for malignant neoplasm of breast: Secondary | ICD-10-CM

## 2024-05-04 ENCOUNTER — Other Ambulatory Visit: Payer: Self-pay | Admitting: Internal Medicine

## 2024-06-07 ENCOUNTER — Emergency Department (HOSPITAL_COMMUNITY)
Admission: EM | Admit: 2024-06-07 | Discharge: 2024-06-08 | Disposition: A | Discharge status: Home / Self Care | Attending: Emergency Medicine | Admitting: Emergency Medicine

## 2024-06-07 ENCOUNTER — Encounter (HOSPITAL_COMMUNITY): Payer: Self-pay

## 2024-06-07 DIAGNOSIS — I10 Essential (primary) hypertension: Secondary | ICD-10-CM | POA: Insufficient documentation

## 2024-06-07 DIAGNOSIS — Z79899 Other long term (current) drug therapy: Secondary | ICD-10-CM | POA: Diagnosis not present

## 2024-06-07 DIAGNOSIS — R55 Syncope and collapse: Secondary | ICD-10-CM | POA: Insufficient documentation

## 2024-06-07 DIAGNOSIS — R42 Dizziness and giddiness: Secondary | ICD-10-CM | POA: Diagnosis present

## 2024-06-07 NOTE — ED Triage Notes (Addendum)
 Patient arrived stating she woke up from a nap tonight at 7pm and felt lightheaded like she was going to pass out. Declines any other symptoms, blood pressure elevated in triage but states she is taking her medication as prescribed.

## 2024-06-08 LAB — URINALYSIS, ROUTINE W REFLEX MICROSCOPIC
Bilirubin Urine: NEGATIVE
Glucose, UA: NEGATIVE mg/dL
Hgb urine dipstick: NEGATIVE
Ketones, ur: NEGATIVE mg/dL
Leukocytes,Ua: NEGATIVE
Nitrite: NEGATIVE
Protein, ur: NEGATIVE mg/dL
Specific Gravity, Urine: 1.015 (ref 1.005–1.030)
pH: 6 (ref 5.0–8.0)

## 2024-06-08 LAB — COMPREHENSIVE METABOLIC PANEL WITH GFR
ALT: 25 U/L (ref 0–44)
AST: 23 U/L (ref 15–41)
Albumin: 3.9 g/dL (ref 3.5–5.0)
Alkaline Phosphatase: 87 U/L (ref 38–126)
Anion gap: 9 (ref 5–15)
BUN: 20 mg/dL (ref 6–20)
CO2: 28 mmol/L (ref 22–32)
Calcium: 9.5 mg/dL (ref 8.9–10.3)
Chloride: 106 mmol/L (ref 98–111)
Creatinine, Ser: 0.85 mg/dL (ref 0.44–1.00)
GFR, Estimated: >60 mL/min (ref >60)
Glucose, Bld: 153 mg/dL — ABNORMAL HIGH (ref 70–99)
Potassium: 3.4 mmol/L — ABNORMAL LOW (ref 3.5–5.1)
Sodium: 144 mmol/L (ref 135–145)
Total Bilirubin: <0.2 mg/dL (ref 0.0–1.2)
Total Protein: 7 g/dL (ref 6.5–8.1)

## 2024-06-08 LAB — CBC
HCT: 36.2 % (ref 36.0–46.0)
Hemoglobin: 12.1 g/dL (ref 12.0–15.0)
MCH: 30.5 pg (ref 26.0–34.0)
MCHC: 33.4 g/dL (ref 30.0–36.0)
MCV: 91.2 fL (ref 80.0–100.0)
Platelets: 288 K/uL (ref 150–400)
RBC: 3.97 MIL/uL (ref 3.87–5.11)
RDW: 13.6 % (ref 11.5–15.5)
WBC: 7.2 K/uL (ref 4.0–10.5)
nRBC: 0 % (ref 0.0–0.2)

## 2024-06-08 LAB — CBG MONITORING, ED: Glucose-Capillary: 129 mg/dL — ABNORMAL HIGH (ref 70–99)

## 2024-06-08 LAB — HCG, SERUM, QUALITATIVE: Preg, Serum: NEGATIVE

## 2024-06-08 NOTE — ED Provider Notes (Signed)
 [place] EMERGENCY DEPARTMENT AT [facility] Provider Note   CSN: [id number] Arrival date & time: 06/07/24  2320     Patient presents with: Dizziness   Terri Chambers is a 48 y.o. female.  {Add pertinent medical, surgical, social history, OB history to YEP:67052} The history is provided by the patient and medical records.  Dizziness  48 y.o. F with hx of obesity, HTN, myasthenia gravis, rheumatoid arthritis, presenting to the ED for lightheadedness and near syncope.  Patient reports she had a normal day today but only ate twice today-- one meal was [employer] rotisserie chicken and a bowl of cereal.  States she took a nap and woke up around 7 PM and felt like she was lightheaded and having to steady herself when she stood up from sitting position.  She did not have any falls or loss of consciousness.  States symptoms have slowly improved but still does not feel quite normal just yet.  She denies any numbness, weakness, blurred vision, difficulty swallowing, changes in speech, headaches, or chest pain.  No nausea or vomiting.  She has been taking her blood pressure medications as prescribed.  No recent medication changes.  BP was elevated upon arrival.  Prior to Admission medications   Medication Sig Start Date End Date Taking? Authorizing Provider  albuterol  (VENTOLIN  HFA) 108 (90 Base) MCG/ACT inhaler Inhale 2 puffs into the lungs every 4 (four) hours as needed for wheezing or shortness of breath. 12/08/22   Sung, Jade J, MD  amLODipine  (NORVASC ) 10 MG tablet Take 1 tablet (10 mg total) by mouth daily. 11/08/20 03/26/24  Gonfa, Taye T, MD  atenolol  (TENORMIN ) 100 MG tablet Take 100 mg by mouth every morning.     [provider]  buPROPion (WELLBUTRIN XL) 150 MG 24 hr tablet Take 150 mg by mouth daily. 01/09/24   [provider]  folic acid (FOLVITE) 1 MG tablet Take 1 mg by mouth daily.    [provider]  hydroxychloroquine (PLAQUENIL) 200 MG tablet Take 200  mg by mouth 2 (two) times daily.    [provider]  ketoconazole (NIZORAL) 2 % shampoo Apply 1 Application topically as needed.    [provider]  losartan  (COZAAR ) 100 MG tablet Take 100 mg by mouth daily.    [provider]  methotrexate (RHEUMATREX) 2.5 MG tablet Take 20 mg by mouth once a week.    [provider]  NEOMYCIN -POLYMYXIN-HYDROCORTISONE (CORTISPORIN) 1 % SOLN OTIC solution Apply 1-2 drops to toe BID after soaking 09/13/22   Hyatt, Max T, DPM  Vitamin D-Vitamin K (VITAMIN K2-VITAMIN D3 PO) Take 1 capsule by mouth daily at 2 am.    [provider]    Allergies: Amlodipine     Review of Systems  Neurological:  Positive for light-headedness.  All other systems reviewed and are negative.   Updated Vital Signs BP (!) 182/108 (BP Location: Left Arm)   Pulse 87   Temp 97.7 F (36.5 C) (Oral)   Resp 18   SpO2 97%   Physical Exam Vitals and nursing note reviewed.  Constitutional:      Appearance: She is well-developed.  HENT:     Head: Normocephalic and atraumatic.  Eyes:     Conjunctiva/sclera: Conjunctivae normal.     Pupils: Pupils are equal, round, and reactive to light.  Cardiovascular:     Rate and Rhythm: Normal rate and regular rhythm.     Heart sounds: Normal heart sounds.  Pulmonary:  Effort: Pulmonary effort is normal.     Breath sounds: Normal breath sounds.  Abdominal:     General: Bowel sounds are normal.     Palpations: Abdomen is soft.  Musculoskeletal:        General: Normal range of motion.     Cervical back: Normal range of motion.  Skin:    General: Skin is warm and dry.  Neurological:     Mental Status: She is alert and oriented to person, place, and time.     (all labs ordered are listed, but only abnormal results are displayed) Labs Reviewed  COMPREHENSIVE METABOLIC PANEL WITH GFR  CBC  URINALYSIS, ROUTINE W REFLEX MICROSCOPIC  HCG, SERUM, QUALITATIVE  CBG MONITORING, ED     EKG: None  Radiology: No results found.  {Document cardiac monitor, telemetry assessment procedure when appropriate:32947} Procedures   Medications Ordered in the ED - No data to display    {Click here for ABCD2, HEART and other calculators REFRESH Note before signing:1}                              Medical Decision Making Amount and/or Complexity of Data Reviewed Labs: ordered.   ***  {Document critical care time when appropriate  Document review of labs and clinical decision tools ie CHADS2VASC2, etc  Document your independent review of radiology images and any outside records  Document your discussion with family members, caretakers and with consultants  Document social determinants of health affecting pt's care  Document your decision making why or why not admission, treatments were needed:32947:::1}   Final diagnoses:  None    ED Discharge Orders     None

## 2024-06-08 NOTE — Discharge Instructions (Signed)
 EKG and labs today were all reassuring.  Your symptoms may have been due to spike in blood pressure. Make sure to watch your salt intake, stay well-hydrated. Follow-up with your doctor if any ongoing issues. Return here for new concerns.

## 2024-06-19 ENCOUNTER — Ambulatory Visit (HOSPITAL_COMMUNITY): Admit: 2024-06-19 | Admitting: Internal Medicine

## 2024-06-19 ENCOUNTER — Encounter (HOSPITAL_COMMUNITY): Payer: Self-pay

## 2024-06-19 SURGERY — COLONOSCOPY
Anesthesia: Monitor Anesthesia Care
